# Patient Record
Sex: Male | Born: 1985 | Race: White | Hispanic: No | Marital: Single | Smoking: Never smoker
Health system: Southern US, Community
[De-identification: ages and names within clinical notes are randomized; demographics above are authoritative.]

## PROBLEM LIST (undated history)

## (undated) DIAGNOSIS — F79 Unspecified intellectual disabilities: Secondary | ICD-10-CM

## (undated) DIAGNOSIS — Z9281 Personal history of extracorporeal membrane oxygenation (ECMO): Secondary | ICD-10-CM

## (undated) DIAGNOSIS — F419 Anxiety disorder, unspecified: Secondary | ICD-10-CM

## (undated) DIAGNOSIS — I10 Essential (primary) hypertension: Secondary | ICD-10-CM

## (undated) DIAGNOSIS — G40909 Epilepsy, unspecified, not intractable, without status epilepticus: Secondary | ICD-10-CM

## (undated) HISTORY — DX: Epilepsy, unspecified, not intractable, without status epilepticus: G40.909

## (undated) HISTORY — DX: Anxiety disorder, unspecified: F41.9

## (undated) HISTORY — DX: Essential (primary) hypertension: I10

## (undated) HISTORY — DX: Personal history of extracorporeal membrane oxygenation (ECMO): Z92.81

## (undated) HISTORY — DX: Unspecified intellectual disabilities: F79

---
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
## 2009-11-03 ENCOUNTER — Emergency Department (HOSPITAL_COMMUNITY): Admission: EM | Admit: 2009-11-03 | Discharge: 2009-11-03 | Payer: Self-pay | Admitting: Family Medicine

## 2010-07-21 ENCOUNTER — Encounter: Admission: RE | Admit: 2010-07-21 | Discharge: 2010-08-04 | Payer: Self-pay | Source: Home / Self Care

## 2010-08-04 ENCOUNTER — Encounter
Admission: RE | Admit: 2010-08-04 | Discharge: 2010-09-02 | Payer: Self-pay | Source: Home / Self Care | Attending: Psychiatry | Admitting: Psychiatry

## 2012-10-26 ENCOUNTER — Ambulatory Visit (INDEPENDENT_AMBULATORY_CARE_PROVIDER_SITE_OTHER): Payer: Medicaid Other | Admitting: Neurology

## 2012-10-26 ENCOUNTER — Encounter: Payer: Self-pay | Admitting: Neurology

## 2012-10-26 VITALS — BP 130/80 | Resp 14 | Wt 189.0 lb

## 2012-10-26 DIAGNOSIS — Q079 Congenital malformation of nervous system, unspecified: Secondary | ICD-10-CM

## 2012-10-26 DIAGNOSIS — G40209 Localization-related (focal) (partial) symptomatic epilepsy and epileptic syndromes with complex partial seizures, not intractable, without status epilepticus: Secondary | ICD-10-CM

## 2012-10-26 NOTE — Progress Notes (Signed)
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  He attended special school for several years although there were problems with aggressive behavior. He is now cared for at home by his mother and his sister.  In 2010 or 2011, he was initiated on Clozaril, and gradually escalated up to a dosage of 100 mg per day.  This is greatly decreased the aggressive behavior, and he is more able to show his gentler more acceptable side.  He still has impulsivity and he has just recently started a trial of Concerta 36 mg to see if this will affect his behavior.  At some point, he did have an EEG and this was interpreted as showing epileptiform discharges.  This is per his mother, and his mother thinks that the EEG was done after the Clozaril was started.  For the past 2 years, he has had episodes of loss of tone where he will fall forward or backward or sideways. It has happened a few times a week. He falls back he typically ends up more or less sitting and he doesn't fall back in his head. He falls forward he tends to his knees in his hands.  He did not lose consciousness but he may turn pale in color or case the period when his vital signs are checked after these episodes they don't seem to be particularly out of range.  He has had some episodes of low white blood cell count while in the Clozaril.  He is now referred for evaluation of the falling attacks.  He is nonverbal so it is difficult to get comprehensive review of systems.  He generally does not complain of pain except when he falls on his hands her knees.  He sleeps very well and he has impulsive and aggressive behavior at times.  He has not cooperative with testing  her blood showing at all times. It was difficult to get his blood pressure today. Nothing further is revealed in the review of symptoms.  Past Medical History  Diagnosis Date  . Personal history of extracorporeal membrane oxygenation (ECMO)   . Mental retardation    scar from ecmo and possibly an occluded right internal carotid artery.  No current outpatient prescriptions on file prior to visit.   No current facility-administered medications on file prior to visit.   Review of patient's allergies indicates no known allergies.  History   Social History  . Marital Status: Single    Spouse Name: N/A    Number of Children: N/A  . Years of Education: N/A   Occupational History  . Not on file.   Social History Main Topics  . Smoking status: Never Smoker   . Smokeless tobacco: Never Used  . Alcohol Use: No  . Drug Use: No  . Sexually Active: Not on file   Other Topics Concern  . Not on file   Social History Narrative  . No narrative on file   No family history on file.   BP 130/80  Resp 14  Wt 189 lb (85.73 kg)   Alert And nonverbal  No carotid bruits detected.  Cranial nerve II through XII are grossly within normal  limits.  Motor strength is  Grossly 5 over 5 throughout all limbs.  No atrophy,    His movements are somewhat herky-jerky but no gross choreiform movements.  Tone may be slightly increased Reflexes are1- 2+ and symmetric in the upper and lower extremities Sensory exam is grossly intact. Coordination reveals a somewhat jerky movements in the limbs Gait and station reveals valgus deformity of the knees and poor tandem.   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 This has resulted in behavioral problems which were very difficult to control and had been benefited significantly with the addition of Clozaril 400 mg.  However, there is also report of epileptiform discharges from the  brain, which can be do to Clozaril.   The drop attacks he is having may well be cerebral in origin and there is the possibility that this could be do to an epileptiform issue.  So there is definitely some significant chance that the medication has resulted in him having these episodes, or least worsening them.  His mother feel that at this point the benefits outweigh the issues, although she is somewhat concerned about the potential for injury.  He has not had a head injury so far and he has not required a helmet.  Concerta can be promoting of epileptiform discharges as well and this medication should be used with caution.  Plan: 1. He'll finish out his Concerta trial for one month and is noticing a benefit I assume that this will be discontinued. 2. Is mother will discuss whether there are potential alternatives to Clozaril with his prescribing caregiver. 3.  He will return here in 6 weeks and if he is to continue to Clozaril and the spells have not resolved, we will consider adding another medication to see if it will slow down these attacks.  I explained to his mother that there is no guarantee that, but there is some possibility that another medication could be helpful in this regard.

## 2012-10-28 ENCOUNTER — Other Ambulatory Visit: Payer: Self-pay | Admitting: Neurology

## 2012-10-28 DIAGNOSIS — G40309 Generalized idiopathic epilepsy and epileptic syndromes, not intractable, without status epilepticus: Secondary | ICD-10-CM

## 2012-10-28 MED ORDER — DIVALPROEX SODIUM ER 500 MG PO TB24: 500.0000 mg | ORAL_TABLET | Freq: Two times a day (BID) | ORAL | Status: DC

## 2012-11-04 ENCOUNTER — Telehealth: Payer: Self-pay | Admitting: Neurology

## 2012-11-04 NOTE — Telephone Encounter (Signed)
Called and spoke with the patient's mom. She reports that Julian Gray is worse and she feels it is from the medication (Depakote) that Dr. Smiley Houseman prescribed at his last office visit. She also states that Concerta was started on 3/10 and stopped on 3/26. She doesn't feel like he is worse because of this. I let her know that Dr. Smiley Houseman was out of the office today but would be back on 04/07. I recommended a couple of things to her: I told her I would try to get in touch with Dr. Smiley Houseman and ask him to review this if she didn't want to wait until Monday; I suggested that she could call his PCP for his opinion; if his symptoms worsened, she should take him to the ED; and lastly, I recommended she go to once a day dosing until Monday. Julian Gray states she will decrease the Depakote to once a day and see if that helps.  **Dr. Smiley Houseman, please advise.

## 2012-11-04 NOTE — Telephone Encounter (Signed)
Lee left VM message: "He saw Dr. Smiley Houseman on March 26. He was prescribed Depakote and he has been taking it since the 27th. He is having more falls--falling forward, falling backwards, falling while sitting; lots of eye blinking; he is having trouble just getting through the day. Sits slumped in his chair; can barely use the utensils to eat his food. He is taking the Depakote 500 mg BID and we need to know what Dr. Smiley Houseman wants [place] to do. CB [id number].

## 2012-11-07 ENCOUNTER — Other Ambulatory Visit: Payer: Self-pay | Admitting: Neurology

## 2012-11-07 DIAGNOSIS — R569 Unspecified convulsions: Secondary | ICD-10-CM

## 2012-11-07 NOTE — Telephone Encounter (Signed)
Decrease depakote to once a day.  If she thinks he would be able to cooperate, then a follow up EEG test would give [place] useful information.  So if possible, we can order this as well.

## 2012-11-07 NOTE — Telephone Encounter (Signed)
Called and spoke with Julian Gray. Informed to not restart the Depakote at this time. EEG scheduled for Wednesday, April 16th at 11:00 am. Aware of the appointment and phone number given as well.

## 2012-11-07 NOTE — Telephone Encounter (Signed)
If he responded that poorly, then don't restart.  We can check the EEG first then see.

## 2012-11-07 NOTE — Telephone Encounter (Signed)
Called and spoke with Julian Gray, the patient's mom. She reports that she has stopped the Depakote altogether (last dose Friday am). He continues to fall and be very weak. Has fallen 3 times this morning. He was able to hold his spoon and feed himself this morning but needed help with his drinking cup. She feels like he will be able to have the EEG and is in favor of that. She wanted to know if Dr. Smiley Houseman wants her to restart the Depakote? I told her that I would check with him, schedule the EEG and call her with the appointment and Dr. Hyacinth Meeker recommendation. She is fine with this plan. **Dr. Smiley Houseman, restart the Depakote?

## 2012-11-16 ENCOUNTER — Ambulatory Visit (HOSPITAL_COMMUNITY)
Admission: RE | Admit: 2012-11-16 | Discharge: 2012-11-16 | Disposition: A | Discharge status: Home / Self Care | Payer: Medicaid Other | Source: Ambulatory Visit | Attending: Neurology | Admitting: Neurology

## 2012-11-16 DIAGNOSIS — R569 Unspecified convulsions: Secondary | ICD-10-CM | POA: Insufficient documentation

## 2012-11-16 DIAGNOSIS — F79 Unspecified intellectual disabilities: Secondary | ICD-10-CM | POA: Insufficient documentation

## 2012-11-16 DIAGNOSIS — G40209 Localization-related (focal) (partial) symptomatic epilepsy and epileptic syndromes with complex partial seizures, not intractable, without status epilepticus: Secondary | ICD-10-CM

## 2012-11-16 NOTE — Progress Notes (Signed)
EEG  Completed; results pending. 

## 2012-11-21 NOTE — Procedures (Signed)
Julian Gray is a 27 y.o. male patient. 1. Seizures    Past Medical History  Diagnosis Date  . Personal history of extracorporeal membrane oxygenation (ECMO)   . Mental retardation    There were no vitals taken for this visit.  Procedures  Routine EEG  Indication: Falling and twitching spells  This is a 17 channel EEG using standard 10-20 electrode placement  Findings.  The background cosists of 6-8 hz symmetric theta acivitiy.  There is prominenet superimposed 3 hz delta activity, maximal in the right fronatl region, and reflected to the left with smaller amplitude.  The amplitude is 150 to 200 microvolts.  The delta occurs in periods lasting 3 to 9 seconds and is present for aproximately 40% of the recording.  There are also sharp waves and spikes maximal at the F4 electrode sometimes seen during the delta activity, but no classic 3 hx spike and wave is seen.  Interpretation. 1.  Generalized lsowing with 6-8hz  background 2. Focal slowing with delta in the fronatal areas occuring frequently and maximal on the right 3.  Sharp waves and spikes maximal at F4 (right frontal) This would be consistent with the history of significant perinatal birth injury, pewrhaps affecting the right fronatl area the most.  The spike wave forms do suggest a significant possibility that a seizure focus is also present in the right frontal lobe.  Cayde Held  L. 11/21/2012

## 2012-12-07 ENCOUNTER — Encounter: Payer: Self-pay | Admitting: Neurology

## 2012-12-07 ENCOUNTER — Ambulatory Visit (INDEPENDENT_AMBULATORY_CARE_PROVIDER_SITE_OTHER): Payer: Medicaid Other | Admitting: Neurology

## 2012-12-07 VITALS — BP 124/78 | HR 80 | Wt 193.0 lb

## 2012-12-07 DIAGNOSIS — G40309 Generalized idiopathic epilepsy and epileptic syndromes, not intractable, without status epilepticus: Secondary | ICD-10-CM

## 2012-12-07 MED ORDER — TOPIRAMATE 25 MG PO TABS: 25.0000 mg | ORAL_TABLET | Freq: Two times a day (BID) | ORAL | Status: DC

## 2012-12-07 NOTE — Progress Notes (Signed)
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  [name] did not have a history of known epilepsy.  However, [name] had an EEG a couple of years ago which showed epileptiform activity.  [name] has been on Clozaril for 2 or 3 years which has really helped his aggressive behavior.  [name] started having falling attacks or drop attacks, which have increased to the point that is having at least one a day.  They occur when [name] sitting or standing. [name] will start to lose postural tone.  It almost looks like a salaam seizure where [name] will bend forward or will not as had 2 or 3 times, and then it is over.  [name] tends to know what is coming, so that when [name] falls [name] puts his hands out to break his fall.  Sometimes [name] falls backwards any [name] will get his butt and roll.    We had an EEG done any shows a slow background in the theta frequency range and [name] also has superimposed bruxism his adult activity higher on the right that lasts for several seconds per episode, and it comprises almost 40% of the record.  It is associated with some sharp waveforms in the right frontal area and sometimes there are spikes.  However is not classic 3 Hz spike and wave.  It is more of a paroxysmal delta activity with some associated sharp waveforms and spikes.  [name] was given Depakote 500 mg twice a day, but his attacks actually worsened significantly and [name] was falling without breaking his fall.  [name] was falling more like a tree.  [name] is also come out of a concert over my concern that the stimulant could exacerbate seizure problems.  We also discussed the Clozaril is associated with epileptiform changes on EEG.  However, the Clozaril has been such a light changing medication that they would really prefer to continue on it.  They are also looking for something additional to see if that will help his impulsivity.  Strattera has been mentioned as a possibility.  The patient is unable to  respond to her review of systems interview.  Past Medical History  Diagnosis Date  . Personal history of extracorporeal membrane oxygenation (ECMO)   . Mental retardation     Current Outpatient Prescriptions on File Prior to Visit  Medication Sig Dispense Refill  . clozapine (FAZACLO) 100 MG disintegrating tablet Take 100 mg by mouth at bedtime.      . Multiple Vitamin (MULTIVITAMIN) tablet Take 1 tablet by mouth daily.      . methylphenidate (CONCERTA) 36 MG CR tablet Take 36 mg by mouth every morning.       No current facility-administered medications on file prior to visit.   Review of patient's allergies indicates no known allergies.  History   Social History  . Marital Status: Single    Spouse Name: N/A    Number of Children: N/A  . Years of Education: N/A   Occupational History  . Not on file.   Social History Main Topics  . Smoking status: Never Smoker   . Smokeless tobacco: Never Used  . Alcohol Use: No  . Drug Use: No  . Sexually Active: Not on file   Other Topics Concern  . Not on file   Social History Narrative  . No narrative on file   No family history on file.   BP 124/78  Pulse 80  Wt 193 lb (87.544 kg)  Alert And nonverbal  No carotid bruits detected.  Cranial nerve II through XII are grossly within normal limits.  Motor strength is Grossly 5 over 5 throughout all limbs. No atrophy, His movements are somewhat herky-jerky but no gross choreiform movements. Tone may be slightly increased  Reflexes are1- 2+ and symmetric in the upper and lower extremities  Sensory exam is grossly intact.  Coordination reveals a somewhat jerky movements in the limbs  Gait and station reveals valgus deformity of the knees and poor tandem.   [name] does have an intact above his head nodding forward to 3 times quickly and then almost falling out of the chair, but [name] recovers in the whole episode only lasts a couple seconds.  Impression: 1. Drop attacks in the last couple  years of increasing frequency.  The occurrence of these did follow the institution of the Clozaril medication.  As mentioned above, Clozaril was such a help to his quality of life at the family does not wish to discontinue it.  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 However a brief trial of Depakote caused worsening of the problem.  Plan: 1. We will cautiously try Topamax at 25 mg a night and increase to 25 mg twice a day.  If [name] tolerates this we may increase further to 50 mg twice a day and see if there is any change in the episodes. 2. If she again worsens or does not improve at all, we will refer him to [facility] epilepsy department for consideration of ambulatory EEG and further evaluation 3. Return in 6 weeks

## 2013-01-18 ENCOUNTER — Ambulatory Visit (INDEPENDENT_AMBULATORY_CARE_PROVIDER_SITE_OTHER): Payer: Medicaid Other | Admitting: Neurology

## 2013-01-18 ENCOUNTER — Encounter: Payer: Self-pay | Admitting: Neurology

## 2013-01-18 VITALS — BP 124/70 | HR 74 | Wt 200.0 lb

## 2013-01-18 DIAGNOSIS — G40309 Generalized idiopathic epilepsy and epileptic syndromes, not intractable, without status epilepticus: Secondary | ICD-10-CM

## 2013-01-18 DIAGNOSIS — G40219 Localization-related (focal) (partial) symptomatic epilepsy and epileptic syndromes with complex partial seizures, intractable, without status epilepticus: Secondary | ICD-10-CM

## 2013-01-18 MED ORDER — TOPIRAMATE 25 MG PO TABS: ORAL_TABLET | ORAL | Status: DC

## 2013-01-18 NOTE — Progress Notes (Signed)
Julian Gray returns for followupperinatal brain injury due to ECMO.  Please see previous notes for details of prior EEG results.  He has been manifesting head dropping and forward bending and bowing behavior that can be repetitive and sometimes can lead to a fall.  EEG did show some vocal rhythmic slow activity with sharp waveforms and spike wave forms, although not classic spike and wave discharge.  He actually was worse on Depakote and would fall if less warning.  Now he is on Topamax 25 mg b.i.d. And the spells aren't be stable and maybe a little bit improved.  Definitely in the last 2 weeks they are less.  However, his Clozaril was decreased from 400 mg to 200 mg a night 2 weeks ago.  Apparently this was due to some laboratory abnormality, possibly a liver function test.  Concurrently, his family has noted a little bit of increased aggressiveness.  He is attending [employer] summer and that is doing reasonably well.  [name] is essentially unable to respond to a review of systems and therefore the history is obtained to his mother.  No other significant complaints noted in the review of systems.  Past Medical History  Diagnosis Date  . Personal history of extracorporeal membrane oxygenation (ECMO)   . Mental retardation     Current Outpatient Prescriptions on File Prior to Visit  Medication Sig Dispense Refill  . clozapine (FAZACLO) 100 MG disintegrating tablet Take 100 mg by mouth at bedtime.      . Multiple Vitamin (MULTIVITAMIN) tablet Take 1 tablet by mouth daily.      Marland Kitchen topiramate (TOPAMAX) 25 MG tablet Take 1 tablet (25 mg total) by mouth 2 (two) times daily.  60 tablet  11   No current facility-administered medications on file prior to visit.   Review of patient's allergies indicates no known allergies.  History   Social History  . Marital Status: Single    Spouse Name: N/A    Number of Children: N/A  . Years of Education: N/A   Occupational History  . Not on file.   Social History Main  Topics  . Smoking status: Never Smoker   . Smokeless tobacco: Never Used  . Alcohol Use: No  . Drug Use: No  . Sexually Active: Not on file   Other Topics Concern  . Not on file   Social History Narrative  . No narrative on file   No family history on file.  BP 124/70  Pulse 74  Wt 200 lb (90.719 kg)   Alert And nonverbal  No carotid bruits detected.  Cranial nerve II through XII are grossly within normal limits.  Motor strength is Grossly 5 over 5 throughout all limbs. No atrophy, His movements are somewhat herky-jerky but no gross choreiform movements. Tone may be slightly increased  Reflexes are1- 2+ and symmetric in the upper and lower extremities  Sensory exam is grossly intact.  Coordination reveals a somewhat jerky movements in the limbs  Gait and station reveals valgus deformity of the knees and poor tandem.   Impression: Peroneal brain injury with focal slowing and focal sharp wave and spike amount is on EEG in this patient who is exhibiting falls and head nodding and forward bending episodes with loss of postural tone.  At this time, the episodes are improved and most of this is not leading to falling as he can catch himself.  This may be due to the reduction in the Clozaril an order to the initiation of the  Topamax at low dose at this point.  He does not appear to be having side effects.  Plan: The Clozaril is increased again, they will observe what as to whether the episodes again worsen.  Then based on observations they can decide whether to continue the Clozaril or reduced the dosage and look for other means of managing his behavior issues. In the meantime, we will go ahead and increase the Topamax to 25 mg in the morning +50 mg at night.  We will reserve the right to potentially increase to 50 mg b.i.d. Depending how this is tolerated. Return here in 4 months

## 2013-01-18 NOTE — Patient Instructions (Addendum)
Follow up in 4 months with Dr. Everlena Cooper.

## 2013-04-26 ENCOUNTER — Telehealth: Payer: Self-pay | Admitting: Neurology

## 2013-04-28 NOTE — Telephone Encounter (Signed)
Left a message for the patient's mom to return my call. 

## 2013-05-01 NOTE — Telephone Encounter (Signed)
Never received a call back

## 2013-05-22 ENCOUNTER — Ambulatory Visit (INDEPENDENT_AMBULATORY_CARE_PROVIDER_SITE_OTHER): Payer: Medicaid Other | Admitting: Neurology

## 2013-05-22 ENCOUNTER — Encounter: Payer: Self-pay | Admitting: Neurology

## 2013-05-22 VITALS — BP 96/68 | HR 90 | Wt 210.0 lb

## 2013-05-22 DIAGNOSIS — G40309 Generalized idiopathic epilepsy and epileptic syndromes, not intractable, without status epilepticus: Secondary | ICD-10-CM

## 2013-05-22 MED ORDER — TOPIRAMATE 25 MG PO TABS: 50.0000 mg | ORAL_TABLET | Freq: Two times a day (BID) | ORAL | Status: DC

## 2013-05-22 NOTE — Progress Notes (Signed)
NEUROLOGY FOLLOW UP OFFICE NOTE  CYPHER PAULE [id number]  HISTORY OF PRESENT ILLNESS: Julian Gray is a 27 year old male with history of mental retardation, premature birth, perinatal sepsis, and plagiocephaly who presents for follow up regarding epilepsy.  He is accompanied by his mother.  He was previously followed by Dr. Murriel Hopper, who has since left  Neurology.  Records and images were personally reviewed where available.    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  He was subsequently given a lifesaving extracorporeal membrane oxygenation (ECMO) treatment.  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  In around 2009, he was started on clozapine and later Concerta for explosive behavior.  He continues to have impulsive behavior, however aggressive behavior has improved and he sleeps well.  In 2010, he began having drop attacks and staring spells. He has loss of tone with head bending and will fall down, either forward, backward or to the side.  It occurs several times a week.  When he falls backward, he usually will fall sitting and not hit his head.  If he falls forward, he will usually land on his knees and hand.  He does not lose consciousness but may turn pale.  Vital signs afterwards are reportedly normal. It is often preceded by jerking of his head or body.  He was initially seen at [facility].  He has had an EEG on 03/19/10 which revealed intermittent bifrontal 3 Hz generalized spike and wave discharges as well as intermittent head jerk movements associated with polyspike discharges.  He was admitted to the EMU at [facility] for monitoring.  At least one of his spells were captured, but it is unclear what was the impression.  It was determined that these spells were due to his underlying brain damage.  He began seeing Dr. Smiley Houseman this past year.  Dr. Smiley Houseman considered that the epileptiform discharges may be  side effects of Clozaril or Concerta.  Concerta was subsequently stopped.  However, since he has responded so well to Clozaril, his mother did not want to discontinue.  Depakote 500mg  BID was initiated by Dr. Smiley Houseman.  His mother felt his spells worsened, with more falls, including while sitting.  He was no longer able to break his falls.  Also he was exhibiting excessive eye blinking and increased lethargy.  Depakote was subsequently discontinued.      An EEG was performed on 11/16/12, revealing 6-8 Hz generalized slowing with superimposed focal delta slowing over the bilateral frontal regions, maximal of the right, with associated sharp waves and spike waves maximal at F4.  However, there were no classic 3Hz  spike and wave discharges.  He was then started on Topamax, first 25mg  qhs and then increased to 25mg  BID.  He is currently being tapered off of clozapine.  He was initially at 400mg  daily and currently is on 100mg  daily.  He will soon be off it completely.  Topamax was increased to 25mg  qAM and 50mg  qhs.  His mother has not noticed any difference.  PAST MEDICAL HISTORY: Past Medical History  Diagnosis Date  . Personal history of extracorporeal membrane oxygenation (ECMO)   . Mental retardation     MEDICATIONS: Current Outpatient Prescriptions on File Prior to Visit  Medication Sig Dispense Refill  . clozapine (FAZACLO) 100 MG disintegrating tablet Take 100 mg by mouth at bedtime.      . Multiple Vitamin (MULTIVITAMIN) tablet Take 1  tablet by mouth daily.       No current facility-administered medications on file prior to visit.    ALLERGIES: No Known Allergies  FAMILY HISTORY: No family history on file.  SOCIAL HISTORY: History   Social History  . Marital Status: Single    Spouse Name: N/A    Number of Children: N/A  . Years of Education: N/A   Occupational History  . Not on file.   Social History Main Topics  . Smoking status: Never Smoker   . Smokeless tobacco: Never  Used  . Alcohol Use: No  . Drug Use: No  . Sexual Activity: Not on file   Other Topics Concern  . Not on file   Social History Narrative  . No narrative on file    REVIEW OF SYSTEMS: Constitutional: No fevers, chills, or sweats, no generalized fatigue, change in appetite Eyes: No visual changes, double vision, eye pain Ear, nose and throat: No hearing loss, ear pain, nasal congestion, sore throat Cardiovascular: No chest pain, palpitations Respiratory:  No shortness of breath at rest or with exertion, wheezes GastrointestinaI: No nausea, vomiting, diarrhea, abdominal pain, fecal incontinence Genitourinary:  No dysuria, urinary retention or frequency Musculoskeletal:  No neck pain, back pain Integumentary: No rash, pruritus, skin lesions Neurological: as above Psychiatric: No depression, insomnia, anxiety Endocrine: No palpitations, fatigue, diaphoresis, mood swings, change in appetite, change in weight, increased thirst Hematologic/Lymphatic:  No anemia, purpura, petechiae. Allergic/Immunologic: no itchy/runny eyes, nasal congestion, recent allergic reactions, rashes  PHYSICAL EXAM: Filed Vitals:   05/22/13 1403  BP: 96/68  Pulse: 90  Unable to obtain other vitals due to poor cooperation. General: No acute distress Head:  Normocephalic/atraumatic Neck: Right post-surgical scar Heart:  Regular rate and rhythm Lungs:  Clear to auscultation bilaterally Back: No paraspinal tenderness Neurological Exam: alert but not oriented. Unable to speak.  CN II-XII intact. Unable to perform fundoscopic exam.Bulk and tone normal, muscle strength 5/5 throughout.  Unable to assess sensation.  Deep tendon reflexes 2+ throughout, toes downgoing.  Finger to nose intact.  Postural tremor in hands.  Gait with valgus deformity in knees.  IMPRESSION: Drop attacks/atonic seizures.  PLAN: 1.  Increase Topamax to 50mg  BID.  Side effects discussed. 2.  Will call pharmacy regarding half-life of  clozapine.  I don't want to increase topamax too much while just weaning off of clozapine so we know which is effective if symptoms improve.  Once we know when medication effects should resolve, and he does not have any improvement, then we will further titrate topamax. 3.  Obtain records from [place]. 4.  Follow up in 3 months.  Shon Millet, DO  CC:  Guerry Bruin, MD

## 2013-05-22 NOTE — Patient Instructions (Signed)
1.  You may increase the topamax to 50mg  twice daily.  We will start topiramate (Topamax) 50mg  tablets.  We will increase the dose as follows to goal of 100mg  twice daily.  Possible side effects include: impaired thinking, sedation, paresthesias (numbness and tingling) and weight loss.  It may cause dehydration and there is a small risk for kidney stones, so make sure to stay hydrated with water during the day.  There is also a very small risk for glaucoma, so if we decide to stay on this medication, Julian Gray should have yearly eye exams.  2.  Before going up any further on the topamax, I want to check with pharmacy regarding how long the clozapine would stay in his system.  Once we know when side effects of clozapine should be resolved, and falls have not improved, then we can start going up on the topamax.  3.  We will get records from [facility].  4.  Follow up in 3 months.  Call with questions and concerns.

## 2013-05-23 ENCOUNTER — Telehealth: Payer: Self-pay | Admitting: Neurology

## 2013-05-23 ENCOUNTER — Other Ambulatory Visit: Payer: Self-pay | Admitting: Neurology

## 2013-05-23 MED ORDER — TOPIRAMATE 25 MG PO TABS: ORAL_TABLET | ORAL | Status: DC

## 2013-05-23 NOTE — Telephone Encounter (Signed)
Spoke with Julian Gray's mom. She wanted to know if she could start the Topamax 50mg  BID while still taking the Clozapine. I told her it was ok. She did speak with the pharmacist who told her 100 mg a day of the Clozapine was not therapeutic anyway. She will discuss this medication with his psychiatrist when he has his appointment next week. **Dr. Everlena Cooper, just a FYI.Marland KitchenMarland Kitchen

## 2013-08-21 ENCOUNTER — Encounter: Payer: Self-pay | Admitting: Neurology

## 2013-08-21 ENCOUNTER — Ambulatory Visit (INDEPENDENT_AMBULATORY_CARE_PROVIDER_SITE_OTHER): Payer: Medicaid Other | Admitting: Neurology

## 2013-08-21 DIAGNOSIS — F39 Unspecified mood [affective] disorder: Secondary | ICD-10-CM

## 2013-08-21 NOTE — Progress Notes (Signed)
NEUROLOGY FOLLOW UP OFFICE NOTE  Julian FlakesKyle R Meyers [id number]  HISTORY OF PRESENT ILLNESS: Julian FootsKyle Gray is a 28 year old male with history of mental retardation, premature birth, perinatal sepsis, and plagiocephaly who presents for follow up regarding epilepsy.  He is accompanied by his mother and his sister.  Records and images were personally reviewed where available.    UPDATE: Since last visit, he was tapered off of clazapine and the drop attacks resolved.  Topamax was discontinued.  However, he had worsening mood problems and violent explosive outbursts.  He was tried on trazodone and Ativan, which were ineffective so then discontinued.  He was started on Zyprexa and Trileptal, and his behavior has improved.  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  He was subsequently given a lifesaving extracorporeal membrane oxygenation (ECMO) treatment.  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  In around 2009, he was started on clozapine and later Concerta for explosive behavior.  He continues to have impulsive behavior, however aggressive behavior has improved and he sleeps well.  In 2010, he began having drop attacks and staring spells. He has loss of tone with head bending and will fall down, either forward, backward or to the side.  It occurs several times a week.  When he falls backward, he usually will fall sitting and not hit his head.  If he falls forward, he will usually land on his knees and hand.  He does not lose consciousness but may turn pale.  Vital signs afterwards are reportedly normal. It is often preceded by jerking of his head or body.  He was [facility] seen at [facility].  He has had an EEG on 03/19/10 which revealed intermittent bifrontal 3 Hz generalized spike and wave discharges as well as intermittent head jerk movements associated with polyspike discharges.  He was admitted [facility] EMU at  [facility].  At least one of his spells were captured, which did not reveal any electrographic correlate.  It was determined that these spells were due to his underlying brain damage.  His previous neurologist,  Dr. Smiley HousemanSoo considered that the epileptiform discharges may be side effects of Clozaril or Concerta.  Concerta was subsequently stopped.  However, since he has responded so well to Clozaril, his mother did not want to discontinue.  Depakote 500mg  BID was initiated by Dr. Smiley HousemanSoo.  His mother felt his spells worsened, with more falls, including while sitting.  He was no longer able to break his falls.  Also he was exhibiting excessive eye blinking and increased lethargy.  Depakote was subsequently discontinued.    He was then started on Topamax.  It was decided to slowly taper him off of clozapine to see if the drop attacks resolve.  An EEG was performed on 11/16/12, revealing 6-8 Hz generalized slowing with superimposed focal delta slowing over the bilateral frontal regions, maximal of the right, with associated sharp waves and spike waves maximal at F4.  However, there were no classic 3Hz  spike and wave discharges.  PAST MEDICAL HISTORY: Past Medical History  Diagnosis Date  . Personal history of extracorporeal membrane oxygenation (ECMO)   . Mental retardation   . Epilepsy     MEDICATIONS: Current Outpatient Prescriptions on File Prior to Visit  Medication Sig Dispense Refill  . Multiple Vitamin (MULTIVITAMIN) tablet Take 1 tablet by mouth daily.       No current facility-administered medications  on file prior to visit.    ALLERGIES: No Known Allergies  FAMILY HISTORY: Family History  Problem Relation Age of Onset  . Ataxia Neg Hx   . Chorea Neg Hx   . Dementia Neg Hx   . Mental retardation Neg Hx   . Migraines Neg Hx   . Multiple sclerosis Neg Hx   . Neurofibromatosis Neg Hx   . Neuropathy Neg Hx   . Parkinsonism Neg Hx   . Seizures Neg Hx   . Stroke Neg Hx      SOCIAL HISTORY: History   Social History  . Marital Status: Single    Spouse Name: N/A    Number of Children: N/A  . Years of Education: N/A   Occupational History  . Not on file.   Social History Main Topics  . Smoking status: Never Smoker   . Smokeless tobacco: Never Used  . Alcohol Use: No  . Drug Use: No  . Sexual Activity: Not on file   Other Topics Concern  . Not on file   Social History Narrative  . No narrative on file    REVIEW OF SYSTEMS: Constitutional: No fevers, chills, or sweats, no generalized fatigue, change in appetite Eyes: No visual changes, double vision, eye pain Ear, nose and throat: No hearing loss, ear pain, nasal congestion, sore throat Cardiovascular: No chest pain, palpitations Respiratory:  No shortness of breath at rest or with exertion, wheezes GastrointestinaI: No nausea, vomiting, diarrhea, abdominal pain, fecal incontinence Genitourinary:  No dysuria, urinary retention or frequency Musculoskeletal:  No neck pain, back pain Integumentary: No rash, pruritus, skin lesions Neurological: as above Psychiatric: No depression, insomnia, anxiety Endocrine: No palpitations, fatigue, diaphoresis, mood swings, change in appetite, change in weight, increased thirst Hematologic/Lymphatic:  No anemia, purpura, petechiae. Allergic/Immunologic: no itchy/runny eyes, nasal congestion, recent allergic reactions, rashes  PHYSICAL EXAM: Filed Vitals:   08/21/13 1438  BP: 138/86  Pulse: 88  Resp: 16   General: No acute distress Head:  Normocephalic/atraumatic  IMPRESSION: Non-epileptic spells, secondary to clozapine Mood disorder History of anoxic brain injury  PLAN: 1.  Further management as per psychiatry.  Since I am not actively treating a neurologic condition, no further follow-up warranted but I would be happy to see him either on an annual basis or as needed.  30 minutes spent with patient and mother, 100% spent counseling and  coordinating care.  Shon Millet, DO  CC:  Guerry Bruin, MD

## 2013-08-21 NOTE — Patient Instructions (Signed)
So it doesn't seem like that Julian Gray is having any seizures.  At this point, I would continue working with the psychiatrist to best treat his mood and behavior.  Since there is nothing neurologic that I am actively treating, no frequent follow-up is needed.  However, he can always follow up on an annual basis or as needed if you would like.  Call with questions or concerns.

## 2015-02-27 DIAGNOSIS — F840 Autistic disorder: Secondary | ICD-10-CM | POA: Insufficient documentation

## 2015-02-27 DIAGNOSIS — F79 Unspecified intellectual disabilities: Secondary | ICD-10-CM | POA: Insufficient documentation

## 2019-08-24 ENCOUNTER — Inpatient Hospital Stay (HOSPITAL_COMMUNITY)
Admission: EM | Admit: 2019-08-24 | Discharge: 2019-09-04 | DRG: 368 | Disposition: A | Discharge status: Home Health | Payer: Medicaid Other | Attending: Thoracic Surgery (Cardiothoracic Vascular Surgery) | Admitting: Thoracic Surgery (Cardiothoracic Vascular Surgery)

## 2019-08-24 ENCOUNTER — Inpatient Hospital Stay (HOSPITAL_COMMUNITY): Payer: Medicaid Other

## 2019-08-24 ENCOUNTER — Emergency Department (HOSPITAL_COMMUNITY): Payer: Medicaid Other

## 2019-08-24 ENCOUNTER — Encounter (HOSPITAL_COMMUNITY)
Admission: EM | Disposition: A | Discharge status: Home Health | Payer: Self-pay | Source: Home / Self Care | Attending: Thoracic Surgery (Cardiothoracic Vascular Surgery)

## 2019-08-24 ENCOUNTER — Inpatient Hospital Stay (HOSPITAL_COMMUNITY): Payer: Medicaid Other | Admitting: Anesthesiology

## 2019-08-24 ENCOUNTER — Encounter (HOSPITAL_COMMUNITY): Payer: Self-pay

## 2019-08-24 ENCOUNTER — Other Ambulatory Visit: Payer: Self-pay

## 2019-08-24 DIAGNOSIS — Z4682 Encounter for fitting and adjustment of non-vascular catheter: Secondary | ICD-10-CM

## 2019-08-24 DIAGNOSIS — J982 Interstitial emphysema: Secondary | ICD-10-CM | POA: Diagnosis present

## 2019-08-24 DIAGNOSIS — F840 Autistic disorder: Secondary | ICD-10-CM | POA: Diagnosis present

## 2019-08-24 DIAGNOSIS — M7989 Other specified soft tissue disorders: Secondary | ICD-10-CM | POA: Diagnosis not present

## 2019-08-24 DIAGNOSIS — Z9281 Personal history of extracorporeal membrane oxygenation (ECMO): Secondary | ICD-10-CM

## 2019-08-24 DIAGNOSIS — F79 Unspecified intellectual disabilities: Secondary | ICD-10-CM | POA: Diagnosis present

## 2019-08-24 DIAGNOSIS — Y848 Other medical procedures as the cause of abnormal reaction of the patient, or of later complication, without mention of misadventure at the time of the procedure: Secondary | ICD-10-CM | POA: Diagnosis not present

## 2019-08-24 DIAGNOSIS — G931 Anoxic brain damage, not elsewhere classified: Secondary | ICD-10-CM | POA: Diagnosis present

## 2019-08-24 DIAGNOSIS — Z09 Encounter for follow-up examination after completed treatment for conditions other than malignant neoplasm: Secondary | ICD-10-CM

## 2019-08-24 DIAGNOSIS — J690 Pneumonitis due to inhalation of food and vomit: Secondary | ICD-10-CM | POA: Diagnosis present

## 2019-08-24 DIAGNOSIS — Z20822 Contact with and (suspected) exposure to covid-19: Secondary | ICD-10-CM | POA: Diagnosis present

## 2019-08-24 DIAGNOSIS — I82611 Acute embolism and thrombosis of superficial veins of right upper extremity: Secondary | ICD-10-CM | POA: Diagnosis not present

## 2019-08-24 DIAGNOSIS — R509 Fever, unspecified: Secondary | ICD-10-CM

## 2019-08-24 DIAGNOSIS — K223 Perforation of esophagus: Principal | ICD-10-CM

## 2019-08-24 DIAGNOSIS — G40909 Epilepsy, unspecified, not intractable, without status epilepticus: Secondary | ICD-10-CM | POA: Diagnosis present

## 2019-08-24 DIAGNOSIS — J189 Pneumonia, unspecified organism: Secondary | ICD-10-CM

## 2019-08-24 DIAGNOSIS — Z79899 Other long term (current) drug therapy: Secondary | ICD-10-CM | POA: Diagnosis not present

## 2019-08-24 DIAGNOSIS — R0902 Hypoxemia: Secondary | ICD-10-CM | POA: Diagnosis not present

## 2019-08-24 DIAGNOSIS — Z419 Encounter for procedure for purposes other than remedying health state, unspecified: Secondary | ICD-10-CM

## 2019-08-24 DIAGNOSIS — J90 Pleural effusion, not elsewhere classified: Secondary | ICD-10-CM

## 2019-08-24 HISTORY — PX: REMOVAL OF GASTROSTOMY TUBE: SHX6058

## 2019-08-24 HISTORY — PX: ESOPHAGOGASTRODUODENOSCOPY: SHX5428

## 2019-08-24 HISTORY — PX: CHEST TUBE INSERTION: SHX231

## 2019-08-24 LAB — POC SARS CORONAVIRUS 2 AG -  ED: SARS Coronavirus 2 Ag: NEGATIVE

## 2019-08-24 LAB — CBC WITH DIFFERENTIAL/PLATELET
Abs Immature Granulocytes: 0.05 10*3/uL (ref 0.00–0.07)
Basophils Absolute: 0 10*3/uL (ref 0.0–0.1)
Basophils Relative: 0 %
Eosinophils Absolute: 0.1 10*3/uL (ref 0.0–0.5)
Eosinophils Relative: 1 %
HCT: 55.5 % — ABNORMAL HIGH (ref 39.0–52.0)
Hemoglobin: 18.7 g/dL — ABNORMAL HIGH (ref 13.0–17.0)
Immature Granulocytes: 0 %
Lymphocytes Relative: 7 %
Lymphs Abs: 0.8 10*3/uL (ref 0.7–4.0)
MCH: 30.2 pg (ref 26.0–34.0)
MCHC: 33.7 g/dL (ref 30.0–36.0)
MCV: 89.5 fL (ref 80.0–100.0)
Monocytes Absolute: 1.2 10*3/uL — ABNORMAL HIGH (ref 0.1–1.0)
Monocytes Relative: 11 %
Neutro Abs: 9.5 10*3/uL — ABNORMAL HIGH (ref 1.7–7.7)
Neutrophils Relative %: 81 %
Platelets: 237 10*3/uL (ref 150–400)
RBC: 6.2 MIL/uL — ABNORMAL HIGH (ref 4.22–5.81)
RDW: 12.3 % (ref 11.5–15.5)
WBC: 11.7 10*3/uL — ABNORMAL HIGH (ref 4.0–10.5)
nRBC: 0 % (ref 0.0–0.2)

## 2019-08-24 LAB — I-STAT CHEM 8, ED
BUN: 25 mg/dL — ABNORMAL HIGH (ref 6–20)
Calcium, Ion: 1.17 mmol/L (ref 1.15–1.40)
Chloride: 106 mmol/L (ref 98–111)
Creatinine, Ser: 0.8 mg/dL (ref 0.61–1.24)
Glucose, Bld: 130 mg/dL — ABNORMAL HIGH (ref 70–99)
HCT: 56 % — ABNORMAL HIGH (ref 39.0–52.0)
Hemoglobin: 19 g/dL — ABNORMAL HIGH (ref 13.0–17.0)
Potassium: 4.3 mmol/L (ref 3.5–5.1)
Sodium: 141 mmol/L (ref 135–145)
TCO2: 23 mmol/L (ref 22–32)

## 2019-08-24 LAB — HEPATIC FUNCTION PANEL
ALT: 26 U/L (ref 0–44)
AST: 20 U/L (ref 15–41)
Albumin: 5.1 g/dL — ABNORMAL HIGH (ref 3.5–5.0)
Alkaline Phosphatase: 104 U/L (ref 38–126)
Bilirubin, Direct: 0.1 mg/dL (ref 0.0–0.2)
Indirect Bilirubin: 0.8 mg/dL (ref 0.3–0.9)
Total Bilirubin: 0.9 mg/dL (ref 0.3–1.2)
Total Protein: 8.5 g/dL — ABNORMAL HIGH (ref 6.5–8.1)

## 2019-08-24 LAB — BASIC METABOLIC PANEL
Anion gap: 13 (ref 5–15)
BUN: 25 mg/dL — ABNORMAL HIGH (ref 6–20)
CO2: 24 mmol/L (ref 22–32)
Calcium: 10 mg/dL (ref 8.9–10.3)
Chloride: 104 mmol/L (ref 98–111)
Creatinine, Ser: 0.85 mg/dL (ref 0.61–1.24)
GFR calc Af Amer: >60 mL/min (ref >60)
GFR calc non Af Amer: >60 mL/min (ref >60)
Glucose, Bld: 120 mg/dL — ABNORMAL HIGH (ref 70–99)
Potassium: 4.3 mmol/L (ref 3.5–5.1)
Sodium: 141 mmol/L (ref 135–145)

## 2019-08-24 LAB — RESPIRATORY PANEL BY RT PCR (FLU A&B, COVID)
Influenza A by PCR: NEGATIVE
Influenza B by PCR: NEGATIVE
SARS Coronavirus 2 by RT PCR: NEGATIVE

## 2019-08-24 LAB — BLOOD GAS, ARTERIAL
Acid-base deficit: 0.2 mmol/L (ref 0.0–2.0)
Bicarbonate: 23.6 mmol/L (ref 20.0–28.0)
FIO2: 100
O2 Saturation: 99.6 %
Patient temperature: 98.6
pCO2 arterial: 38.1 mmHg (ref 32.0–48.0)
pH, Arterial: 7.409 (ref 7.350–7.450)
pO2, Arterial: 385 mmHg — ABNORMAL HIGH (ref 83.0–108.0)

## 2019-08-24 LAB — TYPE AND SCREEN
ABO/RH(D): A POS
Antibody Screen: NEGATIVE

## 2019-08-24 LAB — LIPASE, BLOOD: Lipase: 21 U/L (ref 11–51)

## 2019-08-24 SURGERY — EGD (ESOPHAGOGASTRODUODENOSCOPY)
Anesthesia: General | Site: Chest | Laterality: Right

## 2019-08-24 MED ORDER — FENTANYL CITRATE (PF) 250 MCG/5ML IJ SOLN
INTRAMUSCULAR | Status: AC
  Filled 2019-08-24: qty 5

## 2019-08-24 MED ORDER — ETOMIDATE 2 MG/ML IV SOLN
INTRAVENOUS | Status: AC | PRN
  Administered 2019-08-24: 20 mg via INTRAVENOUS

## 2019-08-24 MED ORDER — SODIUM CHLORIDE 0.9 % IV SOLN: INTRAVENOUS | Status: DC | PRN

## 2019-08-24 MED ORDER — SODIUM CHLORIDE 0.9 % IV SOLN
INTRAVENOUS | Status: AC
  Filled 2019-08-24: qty 1.5

## 2019-08-24 MED ORDER — PROPOFOL 1000 MG/100ML IV EMUL
5.0000 ug/kg/min | INTRAVENOUS | Status: DC
  Administered 2019-08-24: 19:00:00 10 ug/kg/min via INTRAVENOUS
  Filled 2019-08-24 (×3): qty 100

## 2019-08-24 MED ORDER — SODIUM CHLORIDE (PF) 0.9 % IJ SOLN
INTRAMUSCULAR | Status: AC
  Filled 2019-08-24: qty 50

## 2019-08-24 MED ORDER — FENTANYL CITRATE (PF) 250 MCG/5ML IJ SOLN
INTRAMUSCULAR | Status: DC | PRN
  Administered 2019-08-24: 50 ug via INTRAVENOUS
  Administered 2019-08-24: 100 ug via INTRAVENOUS
  Administered 2019-08-24 (×2): 50 ug via INTRAVENOUS

## 2019-08-24 MED ORDER — LACTATED RINGERS IV SOLN: INTRAVENOUS | Status: DC | PRN

## 2019-08-24 MED ORDER — FENTANYL CITRATE (PF) 100 MCG/2ML IJ SOLN
50.0000 ug | Freq: Once | INTRAMUSCULAR | Status: AC
  Administered 2019-08-24: 21:00:00 50 ug via INTRAVENOUS
  Filled 2019-08-24: qty 2

## 2019-08-24 MED ORDER — SODIUM CHLORIDE 0.9 % IV SOLN
INTRAVENOUS | Status: DC | PRN
  Administered 2019-08-24: 1.5 g via INTRAVENOUS

## 2019-08-24 MED ORDER — PROPOFOL 500 MG/50ML IV EMUL
INTRAVENOUS | Status: DC | PRN
  Administered 2019-08-24: 25 ug/kg/min via INTRAVENOUS

## 2019-08-24 MED ORDER — SODIUM CHLORIDE 0.9 % IV BOLUS
500.0000 mL | Freq: Once | INTRAVENOUS | Status: AC
  Administered 2019-08-24: 19:00:00 500 mL via INTRAVENOUS

## 2019-08-24 MED ORDER — ONDANSETRON HCL 4 MG/2ML IJ SOLN: 4.0000 mg | Freq: Four times a day (QID) | INTRAMUSCULAR | Status: DC | PRN

## 2019-08-24 MED ORDER — SENNOSIDES-DOCUSATE SODIUM 8.6-50 MG PO TABS
1.0000 | ORAL_TABLET | Freq: Every day | ORAL | Status: DC
  Administered 2019-08-25 – 2019-09-03 (×10): 1 via ORAL
  Filled 2019-08-24 (×10): qty 1

## 2019-08-24 MED ORDER — INSULIN ASPART 100 UNIT/ML ~~LOC~~ SOLN
0.0000 [IU] | SUBCUTANEOUS | Status: DC
  Administered 2019-08-25: 16:00:00 2 [IU] via SUBCUTANEOUS
  Administered 2019-08-25 – 2019-08-26 (×2): 4 [IU] via SUBCUTANEOUS
  Administered 2019-08-26 – 2019-09-04 (×23): 2 [IU] via SUBCUTANEOUS

## 2019-08-24 MED ORDER — SODIUM CHLORIDE 0.9 % IV SOLN: INTRAVENOUS | Status: DC

## 2019-08-24 MED ORDER — FLUCONAZOLE IN SODIUM CHLORIDE 200-0.9 MG/100ML-% IV SOLN
200.0000 mg | INTRAVENOUS | Status: DC
  Administered 2019-08-25 – 2019-08-30 (×7): 200 mg via INTRAVENOUS
  Filled 2019-08-24 (×8): qty 100

## 2019-08-24 MED ORDER — HYDROMORPHONE HCL 1 MG/ML IJ SOLN: 1.0000 mg | INTRAMUSCULAR | Status: DC | PRN

## 2019-08-24 MED ORDER — HYDROMORPHONE HCL 1 MG/ML IJ SOLN
1.0000 mg | INTRAMUSCULAR | Status: DC | PRN
  Administered 2019-08-26 – 2019-08-27 (×2): 1 mg via INTRAVENOUS
  Filled 2019-08-24 (×2): qty 1

## 2019-08-24 MED ORDER — SUCCINYLCHOLINE CHLORIDE 20 MG/ML IJ SOLN
INTRAMUSCULAR | Status: AC | PRN
  Administered 2019-08-24: 100 mg via INTRAVENOUS

## 2019-08-24 MED ORDER — 0.9 % SODIUM CHLORIDE (POUR BTL) OPTIME
TOPICAL | Status: DC | PRN
  Administered 2019-08-24: 2000 mL

## 2019-08-24 MED ORDER — PROPOFOL 1000 MG/100ML IV EMUL
INTRAVENOUS | Status: AC
  Filled 2019-08-24: qty 100

## 2019-08-24 MED ORDER — DEXMEDETOMIDINE HCL IN NACL 400 MCG/100ML IV SOLN
0.4000 ug/kg/h | INTRAVENOUS | Status: DC
  Administered 2019-08-25: 0.8 ug/kg/h via INTRAVENOUS
  Filled 2019-08-24: qty 100

## 2019-08-24 MED ORDER — IOHEXOL 300 MG/ML  SOLN
75.0000 mL | Freq: Once | INTRAMUSCULAR | Status: AC | PRN
  Administered 2019-08-24: 19:00:00 75 mL via INTRAVENOUS

## 2019-08-24 MED ORDER — FENTANYL CITRATE (PF) 100 MCG/2ML IJ SOLN
50.0000 ug | Freq: Once | INTRAMUSCULAR | Status: AC
  Administered 2019-08-24: 18:00:00 50 ug via INTRAVENOUS
  Filled 2019-08-24: qty 2

## 2019-08-24 MED ORDER — BISACODYL 5 MG PO TBEC
10.0000 mg | DELAYED_RELEASE_TABLET | Freq: Every day | ORAL | Status: DC
  Filled 2019-08-24: qty 2

## 2019-08-24 MED ORDER — SODIUM CHLORIDE 0.9 % IV SOLN
3.0000 g | Freq: Once | INTRAVENOUS | Status: AC
  Administered 2019-08-24: 20:00:00 3 g via INTRAVENOUS
  Filled 2019-08-24: qty 8

## 2019-08-24 MED ORDER — CEFUROXIME SODIUM 1.5 G IV SOLR: 1.5000 g | Freq: Three times a day (TID) | INTRAVENOUS | Status: DC

## 2019-08-24 MED ORDER — PHENYLEPHRINE HCL-NACL 10-0.9 MG/250ML-% IV SOLN
INTRAVENOUS | Status: DC | PRN
  Administered 2019-08-24: 15 ug/min via INTRAVENOUS

## 2019-08-24 MED ORDER — ONDANSETRON HCL 4 MG/2ML IJ SOLN
4.0000 mg | Freq: Once | INTRAMUSCULAR | Status: AC
  Administered 2019-08-24: 18:00:00 4 mg via INTRAVENOUS
  Filled 2019-08-24: qty 2

## 2019-08-24 MED ORDER — ROCURONIUM BROMIDE 10 MG/ML (PF) SYRINGE
PREFILLED_SYRINGE | INTRAVENOUS | Status: DC | PRN
  Administered 2019-08-24: 20 mg via INTRAVENOUS
  Administered 2019-08-24: 50 mg via INTRAVENOUS
  Administered 2019-08-24: 30 mg via INTRAVENOUS
  Administered 2019-08-24: 50 mg via INTRAVENOUS

## 2019-08-24 MED ORDER — ETOMIDATE 2 MG/ML IV SOLN: 20.0000 mg | Freq: Once | INTRAVENOUS | Status: DC

## 2019-08-24 SURGICAL SUPPLY — 116 items
ADAPTER VALVE BIOPSY EBUS (MISCELLANEOUS) IMPLANT
ADPTR VALVE BIOPSY EBUS (MISCELLANEOUS)
APPLIER CLIP ROT 10 11.4 M/L (STAPLE)
BLADE CLIPPER SURG (BLADE) ×5 IMPLANT
BRUSH CYTOL CELLEBRITY 1.5X140 (MISCELLANEOUS) IMPLANT
CANISTER SUCT 3000ML PPV (MISCELLANEOUS) ×5 IMPLANT
CATH ROBINSON RED A/P 14FR (CATHETERS) IMPLANT
CATH THORACIC 28FR (CATHETERS) IMPLANT
CATH THORACIC 28FR RT ANG (CATHETERS) IMPLANT
CATH THORACIC 36FR (CATHETERS) IMPLANT
CATH THORACIC 36FR RT ANG (CATHETERS) IMPLANT
CATH TROCAR 20FR (CATHETERS) IMPLANT
CHLORAPREP W/TINT 26 (MISCELLANEOUS) ×5 IMPLANT
CLIP APPLIE ROT 10 11.4 M/L (STAPLE) IMPLANT
CLIP VESOCCLUDE MED 6/CT (CLIP) IMPLANT
CONN ST 1/4X3/8  BEN (MISCELLANEOUS)
CONN ST 1/4X3/8 BEN (MISCELLANEOUS) IMPLANT
CONN Y 3/8X3/8X3/8  BEN (MISCELLANEOUS)
CONN Y 3/8X3/8X3/8 BEN (MISCELLANEOUS) IMPLANT
CONT SPEC 4OZ CLIKSEAL STRL BL (MISCELLANEOUS) IMPLANT
COVER BACK TABLE 60X90IN (DRAPES) IMPLANT
COVER SURGICAL LIGHT HANDLE (MISCELLANEOUS) IMPLANT
DEFOGGER SCOPE WARMER CLEARIFY (MISCELLANEOUS) IMPLANT
DISSECTOR BLUNT TIP ENDO 5MM (MISCELLANEOUS) IMPLANT
DRAIN CHANNEL 28F RND 3/8 FF (WOUND CARE) IMPLANT
DRAIN CHANNEL 32F RND 10.7 FF (WOUND CARE) IMPLANT
DRAPE C-ARM 42X72 X-RAY (DRAPES) ×5 IMPLANT
DRAPE WARM FLUID 44X44 (DRAPES) IMPLANT
ELECT BLADE 6.5 EXT (BLADE) IMPLANT
ELECT REM PT RETURN 9FT ADLT (ELECTROSURGICAL) ×5
ELECTRODE REM PT RTRN 9FT ADLT (ELECTROSURGICAL) ×3 IMPLANT
FILTER STRAW FLUID ASPIR (MISCELLANEOUS) IMPLANT
FORCEPS BIOP RJ4 1.8 (CUTTING FORCEPS) IMPLANT
FORCEPS RADIAL JAW LRG 4 PULM (INSTRUMENTS) IMPLANT
GAUZE SPONGE 4X4 12PLY STRL (GAUZE/BANDAGES/DRESSINGS) ×5 IMPLANT
GLOVE BIO SURGEON STRL SZ7 (GLOVE) ×10 IMPLANT
GLOVE BIOGEL M 7.0 STRL (GLOVE) ×5 IMPLANT
GLOVE BIOGEL M STRL SZ7.5 (GLOVE) ×5 IMPLANT
GOWN STRL REUS W/ TWL LRG LVL3 (GOWN DISPOSABLE) ×6 IMPLANT
GOWN STRL REUS W/ TWL XL LVL3 (GOWN DISPOSABLE) ×3 IMPLANT
GOWN STRL REUS W/TWL LRG LVL3 (GOWN DISPOSABLE) ×4
GOWN STRL REUS W/TWL XL LVL3 (GOWN DISPOSABLE) ×2
HANDLE STAPLE ENDO GIA SHORT (STAPLE)
HEMOSTAT SURGICEL 2X14 (HEMOSTASIS) IMPLANT
KIT BASIN OR (CUSTOM PROCEDURE TRAY) ×5 IMPLANT
KIT CLEAN ENDO COMPLIANCE (KITS) IMPLANT
KIT SUCTION CATH 14FR (SUCTIONS) IMPLANT
KIT TURNOVER KIT B (KITS) ×5 IMPLANT
MARKER SKIN DUAL TIP RULER LAB (MISCELLANEOUS) ×5 IMPLANT
NEEDLE 22X1 1/2 (OR ONLY) (NEEDLE) IMPLANT
NEEDLE ASPIRATION VIZISHOT 19G (NEEDLE) IMPLANT
NEEDLE ASPIRATION VIZISHOT 21G (NEEDLE) IMPLANT
NEEDLE BLUNT 18X1 FOR OR ONLY (NEEDLE) IMPLANT
NEEDLE SPNL 18GX3.5 QUINCKE PK (NEEDLE) IMPLANT
NS IRRIG 1000ML POUR BTL (IV SOLUTION) ×10 IMPLANT
OIL SILICONE PENTAX (PARTS (SERVICE/REPAIRS)) IMPLANT
PACK CHEST (CUSTOM PROCEDURE TRAY) ×5 IMPLANT
PACK UNIVERSAL I (CUSTOM PROCEDURE TRAY) ×5 IMPLANT
PAD ARMBOARD 7.5X6 YLW CONV (MISCELLANEOUS) ×10 IMPLANT
POUCH ENDO CATCH II 15MM (MISCELLANEOUS) IMPLANT
POUCH SPECIMEN RETRIEVAL 10MM (ENDOMECHANICALS) IMPLANT
RADIAL JAW LRG 4 PULMONARY (INSTRUMENTS)
SCISSORS LAP 5X35 DISP (ENDOMECHANICALS) IMPLANT
SEALANT PROGEL (MISCELLANEOUS) IMPLANT
SEALANT SURG COSEAL 4ML (VASCULAR PRODUCTS) IMPLANT
SEALANT SURG COSEAL 8ML (VASCULAR PRODUCTS) IMPLANT
SEALER LIGASURE MARYLAND 30 (ELECTROSURGICAL) IMPLANT
SET IRRIG TUBING LAPAROSCOPIC (IRRIGATION / IRRIGATOR) IMPLANT
SPECIMEN JAR MEDIUM (MISCELLANEOUS) IMPLANT
SPONGE INTESTINAL PEANUT (DISPOSABLE) IMPLANT
SPONGE TONSIL TAPE 1 RFD (DISPOSABLE) IMPLANT
STAPLER ENDO GIA 12MM SHORT (STAPLE) IMPLANT
STOPCOCK 4 WAY LG BORE MALE ST (IV SETS) IMPLANT
SUT MNCRL AB 3-0 PS2 18 (SUTURE) IMPLANT
SUT MON AB 2-0 CT1 36 (SUTURE) IMPLANT
SUT PDS AB 1 CTX 36 (SUTURE) IMPLANT
SUT PROLENE 4 0 RB 1 (SUTURE)
SUT PROLENE 4-0 RB1 .5 CRCL 36 (SUTURE) IMPLANT
SUT SILK  1 MH (SUTURE) ×2
SUT SILK 1 MH (SUTURE) ×3 IMPLANT
SUT SILK 1 TIES 10X30 (SUTURE) IMPLANT
SUT SILK 2 0 SH (SUTURE) IMPLANT
SUT SILK 2 0SH CR/8 30 (SUTURE) IMPLANT
SUT VIC AB 1 CTX 36 (SUTURE)
SUT VIC AB 1 CTX36XBRD ANBCTR (SUTURE) IMPLANT
SUT VIC AB 2-0 CT1 27 (SUTURE)
SUT VIC AB 2-0 CT1 TAPERPNT 27 (SUTURE) IMPLANT
SUT VIC AB 3-0 SH 27 (SUTURE) ×2
SUT VIC AB 3-0 SH 27X BRD (SUTURE) ×3 IMPLANT
SUT VICRYL 0 UR6 27IN ABS (SUTURE) IMPLANT
SUT VICRYL 2 TP 1 (SUTURE) IMPLANT
SYR 10ML LL (SYRINGE) IMPLANT
SYR 20ML ECCENTRIC (SYRINGE) IMPLANT
SYR 20ML LL LF (SYRINGE) IMPLANT
SYR 30ML LL (SYRINGE) IMPLANT
SYR 3ML LL SCALE MARK (SYRINGE) IMPLANT
SYR 50ML LL SCALE MARK (SYRINGE) IMPLANT
SYR 5ML LL (SYRINGE) IMPLANT
SYR 5ML LUER SLIP (SYRINGE) IMPLANT
SYR BULB IRRIGATION 50ML (SYRINGE) ×5 IMPLANT
SYSTEM SAHARA CHEST DRAIN ATS (WOUND CARE) ×5 IMPLANT
TAPE CLOTH 4X10 WHT NS (GAUZE/BANDAGES/DRESSINGS) ×5 IMPLANT
TIP APPLICATOR SPRAY EXTEND 16 (VASCULAR PRODUCTS) IMPLANT
TOWEL GREEN STERILE (TOWEL DISPOSABLE) ×5 IMPLANT
TOWEL GREEN STERILE FF (TOWEL DISPOSABLE) IMPLANT
TRAP SPECIMEN MUCOUS 40CC (MISCELLANEOUS) IMPLANT
TRAY FOLEY MTR SLVR 16FR STAT (SET/KITS/TRAYS/PACK) IMPLANT
TROCAR BLADELESS 5M (ENDOMECHANICALS) ×5 IMPLANT
TROCAR XCEL BLADELESS 5X75MML (TROCAR) ×5 IMPLANT
TUBE CONNECTING 20'X1/4 (TUBING) ×1
TUBE CONNECTING 20X1/4 (TUBING) ×4 IMPLANT
TUBING EXTENTION W/L.L. (IV SETS) IMPLANT
VALVE BIOPSY  SINGLE USE (MISCELLANEOUS)
VALVE BIOPSY SINGLE USE (MISCELLANEOUS) IMPLANT
VALVE SUCTION BRONCHIO DISP (MISCELLANEOUS) IMPLANT
WATER STERILE IRR 1000ML POUR (IV SOLUTION) ×5 IMPLANT

## 2019-08-24 NOTE — ED Triage Notes (Signed)
Pt arrived into ED in wheelchair from home CC Chocking on food (apple and chips) and throwing up blood risk of aspiration. Pt is non verbal and father is at bedside.  91% room air VSS on 2L [place] 94%

## 2019-08-24 NOTE — Sedation Documentation (Signed)
Tube in

## 2019-08-24 NOTE — Progress Notes (Signed)
ABG obtained on PRVC 620, 16, peep 5 & 100%  Results for Julian Gray, Julian Gray (MRN [id number]) as of 08/24/2019 21:09  Ref. Range 08/24/2019 20:20  FIO2 Unknown 100.00  pH, Arterial Latest Ref Range: 7.350 - 7.450  7.409  pCO2 arterial Latest Ref Range: 32.0 - 48.0 mmHg 38.1  pO2, Arterial Latest Ref Range: 83.0 - 108.0 mmHg 385 (H)  Acid-base deficit Latest Ref Range: 0.0 - 2.0 mmol/L 0.2  Bicarbonate Latest Ref Range: 20.0 - 28.0 mmol/L 23.6  O2 Saturation Latest Units: % 99.6  Patient temperature Unknown 98.6

## 2019-08-24 NOTE — ED Notes (Signed)
Zackowski MD at bedside. 

## 2019-08-24 NOTE — Anesthesia Preprocedure Evaluation (Signed)
Anesthesia Evaluation  Patient identified by MRN, date of birth, ID band Patient awake    Reviewed: Allergy & Precautions, NPO status , Patient's Chart, lab work & pertinent test results  Airway Mallampati: Intubated  TM Distance: >3 FB     Dental  (+) Dental Advisory Given   Pulmonary neg pulmonary ROS,    breath sounds clear to auscultation       Cardiovascular negative cardio ROS   Rhythm:Regular Rate:Normal     Neuro/Psych Seizures -,  Severe nonverbal autism    GI/Hepatic Neg liver ROS, Esophageal perforation   Endo/Other  negative endocrine ROS  Renal/GU negative Renal ROS     Musculoskeletal   Abdominal   Peds  Hematology negative hematology ROS (+)   Anesthesia Other Findings   Reproductive/Obstetrics                             Lab Results  Component Value Date   WBC 11.7 (H) 08/24/2019   HGB 19.0 (H) 08/24/2019   HCT 56.0 (H) 08/24/2019   MCV 89.5 08/24/2019   PLT 237 08/24/2019   Lab Results  Component Value Date   CREATININE 0.80 08/24/2019   BUN 25 (H) 08/24/2019   NA 141 08/24/2019   K 4.3 08/24/2019   CL 106 08/24/2019   CO2 24 08/24/2019    Anesthesia Physical Anesthesia Plan  ASA: IV and emergent  Anesthesia Plan: General   Post-op Pain Management:    Induction: Intravenous  PONV Risk Score and Plan: 2 and Dexamethasone, Ondansetron and Treatment may vary due to age or medical condition  Airway Management Planned: Oral ETT and Double Lumen EBT  Additional Equipment: Arterial line  Intra-op Plan:   Post-operative Plan: Post-operative intubation/ventilation  Informed Consent: I have reviewed the patients History and Physical, chart, labs and discussed the procedure including the risks, benefits and alternatives for the proposed anesthesia with the patient or authorized representative who has indicated his/her understanding and acceptance.      Dental advisory given  Plan Discussed with: CRNA  Anesthesia Plan Comments:         Anesthesia Quick Evaluation

## 2019-08-24 NOTE — ED Provider Notes (Signed)
[facility]-EMERGENCY DEPT Provider Note   CSN: [id number] Arrival date & time: 08/24/19  1723     History Chief Complaint  Patient presents with  . Food Bolus    Choking    Julian Gray is a 34 y.o. male history MR, autism, epilepsy, nonverbal.  Patient presents today with his father following an episode of choking that occurred at home.  Father reports that patient was eating apples and chips when he had sudden onset of coughing and spitting up white sputum.  Father reports that patient initially seemed better and was at baseline until about 1 hour prior to arrival symptoms including multiple episodes of bilious emesis and coughing began.  Level 5 caveat nonverbal HPI     Past Medical History:  Diagnosis Date  . Epilepsy (HCC)   . Mental retardation   . Personal history of extracorporeal membrane oxygenation (ECMO)     Patient Active Problem List   Diagnosis Date Noted  . Pneumomediastinum (HCC) 08/24/2019  . 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 No pertinent surgical history.     Family History  Problem Relation Age of Onset  . Ataxia Neg Hx   . Chorea Neg Hx   . Dementia Neg Hx   . Mental retardation Neg Hx   . Migraines Neg Hx   . Multiple sclerosis Neg Hx   . Neurofibromatosis Neg Hx   . Neuropathy Neg Hx   . Parkinsonism Neg Hx   . Seizures Neg Hx   . Stroke Neg Hx     Social History   Tobacco Use  . Smoking status: Never Smoker  . Smokeless tobacco: Never Used  Substance Use Topics  . Alcohol use: No  . Drug use: No    Home Medications Prior to Admission medications   Medication Sig Start Date End Date Taking? Authorizing Provider  clonazePAM (KLONOPIN) 1 MG tablet Take 1 mg by mouth at bedtime as needed for sleep. 08/20/19  Yes [provider]  clozapine (CLOZARIL) 50 MG tablet Take 50 mg by mouth 3 (three) times daily. 08/07/19  Yes  [provider]  Fish Oil-Cholecalciferol (FISH OIL + D3 PO) Take by mouth daily.   Yes [provider]  Multiple Vitamin (MULTIVITAMIN) tablet Take 1 tablet by mouth daily.   Yes [provider]  naproxen sodium (ANAPROX) 220 MG tablet Take 220 mg by mouth 2 (two) times daily as needed (pain).    Yes [provider]  OLANZapine (ZYPREXA) 15 MG tablet Take 15 mg by mouth at bedtime.   Yes [provider]  oxcarbazepine (TRILEPTAL) 600 MG tablet Take 600 mg by mouth 2 (two) times daily.   Yes [provider]    Allergies    Patient has no known allergies.  Review of Systems   Review of Systems  Unable to perform ROS: Patient nonverbal    Physical Exam Updated Vital Signs BP (!) 172/124   Pulse (!) 105   Temp 98.4 F (36.9 C) (Oral)   Resp 14   Ht 6' (1.829 m)   Wt 106.6 kg   SpO2 100%   BMI 31.87 kg/m   Physical Exam Constitutional:      General: He is not in acute distress.    Appearance: Normal appearance. He is well-developed. He is obese. He is ill-appearing (Uncomfortable appearing). He is not diaphoretic.  HENT:     Head: Normocephalic and atraumatic.  Jaw: There is normal jaw occlusion.     Right Ear: External ear normal.     Left Ear: External ear normal.     Nose: Nose normal.     Mouth/Throat:     Mouth: Mucous membranes are moist.     Pharynx: Oropharynx is clear. Uvula midline.  Eyes:     General: Vision grossly intact. Gaze aligned appropriately.     Pupils: Pupils are equal, round, and reactive to light.  Neck:     Trachea: Trachea and phonation normal. No tracheal deviation.  Cardiovascular:     Rate and Rhythm: Regular rhythm. Tachycardia present.     Heart sounds: Normal heart sounds.  Pulmonary:     Effort: Pulmonary effort is normal. No tachypnea, accessory muscle usage or respiratory distress.     Breath sounds: No stridor. Decreased breath sounds (Shallow inspirations) present.  Chest:      Chest wall: No deformity or tenderness.  Abdominal:     General: There is no distension.     Palpations: Abdomen is soft.     Tenderness: There is no abdominal tenderness. There is no guarding or rebound.  Musculoskeletal:        General: Normal range of motion.     Cervical back: Normal range of motion and neck supple. No rigidity or crepitus.  Skin:    General: Skin is warm and dry.  Neurological:     Mental Status: He is alert. Mental status is at baseline.     GCS: GCS motor subscore is 6.  Psychiatric:        Behavior: Behavior normal.     ED Results / Procedures / Treatments   Labs (all labs ordered are listed, but only abnormal results are displayed) Labs Reviewed  CBC WITH DIFFERENTIAL/PLATELET - Abnormal; Notable for the following components:      Result Value   WBC 11.7 (*)    RBC 6.20 (*)    Hemoglobin 18.7 (*)    HCT 55.5 (*)    Neutro Abs 9.5 (*)    Monocytes Absolute 1.2 (*)    All other components within normal limits  BASIC METABOLIC PANEL - Abnormal; Notable for the following components:   Glucose, Bld 120 (*)    BUN 25 (*)    All other components within normal limits  HEPATIC FUNCTION PANEL - Abnormal; Notable for the following components:   Total Protein 8.5 (*)    Albumin 5.1 (*)    All other components within normal limits  I-STAT CHEM 8, ED - Abnormal; Notable for the following components:   BUN 25 (*)    Glucose, Bld 130 (*)    Hemoglobin 19.0 (*)    HCT 56.0 (*)    All other components within normal limits  RESPIRATORY PANEL BY RT PCR (FLU A&B, COVID)  LIPASE, BLOOD  BLOOD GAS, ARTERIAL  POC SARS CORONAVIRUS 2 AG -  ED  TYPE AND SCREEN    EKG None  Radiology CT Chest W Contrast  Result Date: 08/24/2019 CLINICAL DATA:  Choking on food. Hematemesis. Aspiration. Concern for esophageal perforation. EXAM: CT CHEST WITH CONTRAST TECHNIQUE: Multidetector CT imaging of the chest was performed during intravenous contrast administration.  CONTRAST:  68mL OMNIPAQUE IOHEXOL 300 MG/ML  SOLN COMPARISON:  Chest radiograph from earlier today. FINDINGS: Cardiovascular: Normal heart size. No significant pericardial effusion/thickening. Great vessels are normal in course and caliber. No central pulmonary emboli. Mediastinum/Nodes: No discrete thyroid nodules. There is severe right eccentric wall  thickening throughout the thoracic esophagus with pneumatosis in the right upper thoracic esophageal wall (series 2/image 27). There is pneumomediastinum throughout the high right paraesophageal mediastinum (series 2/image 32). There is ill-defined fluid and fat stranding throughout paraesophageal mediastinum, right greater than left. No pathologically enlarged axillary, mediastinal or hilar lymph nodes. Lungs/Pleura: No pneumothorax. Small dependent right pleural effusion. No left pleural effusion. Endotracheal tube tip is 0.2 cm above the carina. Patchy perihilar consolidation throughout the right lung, most prominent in the right upper lobe, with some associated volume loss. No lung masses or significant pulmonary nodules. Upper abdomen: No acute abnormality. Musculoskeletal: No aggressive appearing focal osseous lesions. Symmetric mild bilateral gynecomastia. IMPRESSION: 1. Low lying endotracheal tube with tip 0.2 cm above the carina, recommend retracting 1.5-2 cm. 2. Spectrum of findings compatible with acute esophageal perforation in the right upper thoracic esophagus with high right paraesophageal pneumomediastinum, high right esophageal wall pneumatosis, severe right eccentric esophageal wall thickening and evidence of paraesophageal mediastinitis. 3. Small dependent right pleural effusion. 4. Patchy right perihilar lung consolidation with some volume loss, favor a combination of atelectasis and aspiration. These results were called by telephone at the time of interpretation on 08/24/2019 at 7:35 pm to provider PA [facility] , who verbally acknowledged  these results. Electronically Signed   By: Ilona Sorrel M.D.   On: 08/24/2019 19:36   DG Chest Portable 1 View  Result Date: 08/24/2019 CLINICAL DATA:  Endotracheal tube placement. EXAM: PORTABLE CHEST 1 VIEW COMPARISON:  08/24/2019 at 5:51 p.m. FINDINGS: Endotracheal tube tip projects 1.5 cm above the carina. Cardiac silhouette is normal in size. No mediastinal or hilar masses. Possible pneumomediastinum suggested on the earlier exam is not evident on the current study. Hazy perihilar opacities noted on the prior study of are less evident. No new lung abnormalities. No pneumothorax. IMPRESSION: 1. Well-positioned endotracheal tube. 2. Mild improvement in perihilar airspace opacities noted on the prior study. There is no current evidence of pneumomediastinum. Electronically Signed   By: Lajean Manes M.D.   On: 08/24/2019 19:20   DG Chest Portable 1 View  Result Date: 08/24/2019 CLINICAL DATA:  Choked on apple.  Hypoxia. EXAM: PORTABLE CHEST 1 VIEW COMPARISON:  None. FINDINGS: Low lung volumes. Normal heart size. Possible pneumomediastinum in the high left mediastinum. No pneumothorax. No pleural effusion. Diffuse hazy parahilar opacity in both lungs. IMPRESSION: 1. Low lung volumes. Diffuse hazy parahilar opacity in both lungs, which could represent aspiration or atelectasis. 2. Possible pneumomediastinum in the high left mediastinum. Consider chest CT with IV and oral contrast for further evaluation. Electronically Signed   By: Ilona Sorrel M.D.   On: 08/24/2019 18:16    Procedures .Critical Care Performed by: Deliah Boston, PA-C Authorized by: Deliah Boston, PA-C   Critical care provider statement:    Critical care time (minutes):  45   Critical care was necessary to treat or prevent imminent or life-threatening deterioration of the following conditions: Esophageal perforation with mediastinitis.   Critical care was time spent personally by me on the following activities:  Discussions  with consultants, evaluation of patient's response to treatment, examination of patient, ordering and performing treatments and interventions, ordering and review of laboratory studies, ordering and review of radiographic studies, pulse oximetry, re-evaluation of patient's condition, obtaining history from patient or surrogate, review of old charts and development of treatment plan with patient or surrogate   (including critical care time)  Medications Ordered in ED Medications  Ampicillin-Sulbactam (UNASYN) 3 g in  sodium chloride 0.9 % 100 mL IVPB (3 g Intravenous New Bag/Given (Non-Interop) 08/24/19 1951)  propofol (DIPRIVAN) 1000 MG/100ML infusion (has no administration in time range)  propofol (DIPRIVAN) 1000 MG/100ML infusion (70 mcg/kg/min  106.6 kg Intravenous Rate/Dose Change 08/24/19 1945)  sodium chloride (PF) 0.9 % injection (has no administration in time range)  0.9 %  sodium chloride infusion (has no administration in time range)  fentaNYL (SUBLIMAZE) injection 50 mcg (50 mcg Intravenous Given 08/24/19 1818)  ondansetron (ZOFRAN) injection 4 mg (4 mg Intravenous Given 08/24/19 1818)  sodium chloride 0.9 % bolus 500 mL (500 mLs Intravenous New Bag/Given 08/24/19 1905)  iohexol (OMNIPAQUE) 300 MG/ML solution 75 mL (75 mLs Intravenous Contrast Given 08/24/19 1917)  etomidate (AMIDATE) injection (20 mg Intravenous Given 08/24/19 1857)  succinylcholine (ANECTINE) injection (100 mg Intravenous Given 08/24/19 1857)  etomidate (AMIDATE) injection (20 mg Intravenous Given 08/24/19 1907)    ED Course  I have reviewed the triage vital signs and the nursing notes.  Pertinent labs & imaging results that were available during my care of the patient were reviewed by me and considered in my medical decision making (see chart for details).  Clinical Course as of Aug 23 2004  Thu Aug 24, 2019  9323 Dr. Cornelius Moras.   [BM]  1940 Dr.  Cornelius Moras   [BM]    Clinical Course User Index [BM] Elizabeth Palau    MDM Rules/Calculators/A&P                     34 year old male arrives 3 hours after a choking episode while eating apples and chips.  Initially had improved but then worsened per father multiple episodes of bloody emesis and coughing.  On initial evaluation patient SPO2 90% on room air, tachycardic 120 bpm, uncomfortable appearing but no acute distress.  Patient placed on submental oxygen.  No stridor, oropharynx clear.  Is able to swallow with some discomfort, not drooling.  Chest x-ray and basic blood work ordered, discussed case with Dr. Deretha Emory who is seeing patient.  Fentanyl and Zofran ordered. - CXR: IMPRESSION:  1. Low lung volumes. Diffuse hazy parahilar opacity in both lungs,  which could represent aspiration or atelectasis.  2. Possible pneumomediastinum in the high left mediastinum. Consider  chest CT with IV and oral contrast for further evaluation.  - Consult called to cardiothoracic surgeon Dr. Cornelius Moras, advises that if patient has esophageal perforation he will need transfer to [facility], additionally if this is an impaction GI will need to be consulted. - Patient reassessed multiple times, appears more uncomfortable, grunting respirations.  Patient moved to resuscitation room.  Due to patient's baseline mental status and inability to obtain CT imaging patient will need to be sedated, Dr. Deretha Emory performing intubation to secure airway.  Consult called to pharmacist Jill Alexanders advises Unasyn for coverage of aspiration versus esophageal perforation. - CT chest:  IMPRESSION:  1. Low lying endotracheal tube with tip 0.2 cm above the carina,  recommend retracting 1.5-2 cm.  2. Spectrum of findings compatible with acute esophageal perforation  in the right upper thoracic esophagus with high right paraesophageal  pneumomediastinum, high right esophageal wall pneumatosis, severe  right eccentric esophageal wall thickening and evidence of  paraesophageal mediastinitis.  3. Small  dependent right pleural effusion.  4. Patchy right perihilar lung consolidation with some volume loss,  favor a combination of atelectasis and aspiration.   Rediscussed case with cardiothoracic surgeon Dr. Cornelius Moras, advises immediate transfer to [facility] for evaluation.  Bed request placed for 2 Heart.  [facility] contacted by charge RN, patient's father will be traveling with the patient to consent for emergency surgery.  Welton FlakesKyle R Novakowski was evaluated in Emergency Department on 08/24/2019 for the symptoms described in the history of present illness. He was evaluated in the context of the global COVID-19 pandemic, which necessitated consideration that the patient might be at risk for infection with the SARS-CoV-2 virus that causes COVID-19. Institutional protocols and algorithms that pertain to the evaluation of patients at risk for COVID-19 are in a state of rapid change based on information released by regulatory bodies including the CDC and federal and state organizations. These policies and algorithms were followed during the patient's care in the ED.   Note: Portions of this report may have been transcribed using voice recognition software. Every effort was made to ensure accuracy; however, inadvertent computerized transcription errors may still be present. Final Clinical Impression(s) / ED Diagnoses Final diagnoses:  Pneumomediastinum [facility](HCC)    Rx / DC Orders ED Discharge Orders    None       Elizabeth PalauMorelli, Tinya Cadogan A, PA-C 08/24/19 [place]orn[place]2006    Zackowski, Scott, MD 08/24/19 2024

## 2019-08-24 NOTE — ED Notes (Signed)
[facility] called for transport. 

## 2019-08-24 NOTE — Anesthesia Procedure Notes (Signed)
Arterial Line Insertion Start/End1/21/2021 9:41 PM, 08/24/2019 9:41 PM Performed by: Adonis Housekeeper, CRNA, CRNA  Patient location: OR. Preanesthetic checklist: patient identified, IV checked, site marked, risks and benefits discussed, surgical consent, monitors and equipment checked, pre-op evaluation, timeout performed and anesthesia consent Left, radial was placed Catheter size: 20 G Hand hygiene performed , maximum sterile barriers used  and Seldinger technique used Allen's test indicative of satisfactory collateral circulation Attempts: 2 Procedure performed without using ultrasound guided technique. Following insertion, dressing applied and Biopatch. Post procedure assessment: normal  Patient tolerated the procedure well with no immediate complications.

## 2019-08-25 ENCOUNTER — Inpatient Hospital Stay (HOSPITAL_COMMUNITY): Payer: Medicaid Other

## 2019-08-25 LAB — GLUCOSE, CAPILLARY
Glucose-Capillary: 101 mg/dL — ABNORMAL HIGH (ref 70–99)
Glucose-Capillary: 111 mg/dL — ABNORMAL HIGH (ref 70–99)
Glucose-Capillary: 117 mg/dL — ABNORMAL HIGH (ref 70–99)
Glucose-Capillary: 118 mg/dL — ABNORMAL HIGH (ref 70–99)
Glucose-Capillary: 118 mg/dL — ABNORMAL HIGH (ref 70–99)
Glucose-Capillary: 121 mg/dL — ABNORMAL HIGH (ref 70–99)
Glucose-Capillary: 196 mg/dL — ABNORMAL HIGH (ref 70–99)

## 2019-08-25 LAB — CBC
HCT: 48 % (ref 39.0–52.0)
Hemoglobin: 16.4 g/dL (ref 13.0–17.0)
MCH: 30.8 pg (ref 26.0–34.0)
MCHC: 34.2 g/dL (ref 30.0–36.0)
MCV: 90.1 fL (ref 80.0–100.0)
Platelets: 169 10*3/uL (ref 150–400)
RBC: 5.33 MIL/uL (ref 4.22–5.81)
RDW: 12.3 % (ref 11.5–15.5)
WBC: 10.2 10*3/uL (ref 4.0–10.5)
nRBC: 0 % (ref 0.0–0.2)

## 2019-08-25 LAB — POCT I-STAT 7, (LYTES, BLD GAS, ICA,H+H)
Acid-Base Excess: 5 mmol/L — ABNORMAL HIGH (ref 0.0–2.0)
Acid-Base Excess: 3 mmol/L — ABNORMAL HIGH (ref 0.0–2.0)
Acid-Base Excess: 1 mmol/L (ref 0.0–2.0)
Bicarbonate: 29.4 mmol/L — ABNORMAL HIGH (ref 20.0–28.0)
Bicarbonate: 29.4 mmol/L — ABNORMAL HIGH (ref 20.0–28.0)
Bicarbonate: 25 mmol/L (ref 20.0–28.0)
Calcium, Ion: 1.19 mmol/L (ref 1.15–1.40)
Calcium, Ion: 1.22 mmol/L (ref 1.15–1.40)
Calcium, Ion: 1.18 mmol/L (ref 1.15–1.40)
HCT: 48 % (ref 39.0–52.0)
HCT: 47 % (ref 39.0–52.0)
HCT: 47 % (ref 39.0–52.0)
Hemoglobin: 16.3 g/dL (ref 13.0–17.0)
Hemoglobin: 16 g/dL (ref 13.0–17.0)
Hemoglobin: 16 g/dL (ref 13.0–17.0)
O2 Saturation: 100 %
O2 Saturation: 98 %
O2 Saturation: 100 %
Patient temperature: 37.5
Patient temperature: 34.2
Patient temperature: 99.7
Potassium: 5.4 mmol/L — ABNORMAL HIGH (ref 3.5–5.1)
Potassium: 4.7 mmol/L (ref 3.5–5.1)
Potassium: 4 mmol/L (ref 3.5–5.1)
Sodium: 140 mmol/L (ref 135–145)
Sodium: 141 mmol/L (ref 135–145)
Sodium: 140 mmol/L (ref 135–145)
TCO2: 31 mmol/L (ref 22–32)
TCO2: 31 mmol/L (ref 22–32)
TCO2: 26 mmol/L (ref 22–32)
pCO2 arterial: 43.3 mmHg (ref 32.0–48.0)
pCO2 arterial: 45.1 mmHg (ref 32.0–48.0)
pCO2 arterial: 37.6 mmHg (ref 32.0–48.0)
pH, Arterial: 7.441 (ref 7.350–7.450)
pH, Arterial: 7.41 (ref 7.350–7.450)
pH, Arterial: 7.433 (ref 7.350–7.450)
pO2, Arterial: 165 mmHg — ABNORMAL HIGH (ref 83.0–108.0)
pO2, Arterial: 99 mmHg (ref 83.0–108.0)
pO2, Arterial: 165 mmHg — ABNORMAL HIGH (ref 83.0–108.0)

## 2019-08-25 LAB — BASIC METABOLIC PANEL
Anion gap: 12 (ref 5–15)
BUN: 19 mg/dL (ref 6–20)
CO2: 22 mmol/L (ref 22–32)
Calcium: 8.8 mg/dL — ABNORMAL LOW (ref 8.9–10.3)
Chloride: 105 mmol/L (ref 98–111)
Creatinine, Ser: 0.83 mg/dL (ref 0.61–1.24)
GFR calc Af Amer: >60 mL/min (ref >60)
GFR calc non Af Amer: >60 mL/min (ref >60)
Glucose, Bld: 139 mg/dL — ABNORMAL HIGH (ref 70–99)
Potassium: 3.9 mmol/L (ref 3.5–5.1)
Sodium: 139 mmol/L (ref 135–145)

## 2019-08-25 LAB — PHOSPHORUS: Phosphorus: 3.8 mg/dL (ref 2.5–4.6)

## 2019-08-25 LAB — TRIGLYCERIDES: Triglycerides: 222 mg/dL — ABNORMAL HIGH (ref <150)

## 2019-08-25 LAB — MAGNESIUM: Magnesium: 2 mg/dL (ref 1.7–2.4)

## 2019-08-25 LAB — ABO/RH: ABO/RH(D): A POS

## 2019-08-25 LAB — MRSA PCR SCREENING: MRSA by PCR: NEGATIVE

## 2019-08-25 MED ORDER — SODIUM CHLORIDE 0.9 % IV SOLN
1.5000 g | Freq: Three times a day (TID) | INTRAVENOUS | Status: DC
  Administered 2019-08-25: 1.5 g via INTRAVENOUS
  Filled 2019-08-25 (×3): qty 1.5

## 2019-08-25 MED ORDER — PIPERACILLIN-TAZOBACTAM 3.375 G IVPB
3.3750 g | Freq: Three times a day (TID) | INTRAVENOUS | Status: DC
  Administered 2019-08-25 – 2019-08-31 (×18): 3.375 g via INTRAVENOUS
  Filled 2019-08-25 (×18): qty 50

## 2019-08-25 MED ORDER — PRO-STAT SUGAR FREE PO LIQD
30.0000 mL | Freq: Two times a day (BID) | ORAL | Status: DC
  Administered 2019-08-25 – 2019-09-04 (×21): 30 mL
  Filled 2019-08-25 (×21): qty 30

## 2019-08-25 MED ORDER — CLONAZEPAM 0.1 MG/ML ORAL SUSPENSION
1.0000 mg | Freq: Every evening | ORAL | Status: DC | PRN
  Filled 2019-08-25: qty 10

## 2019-08-25 MED ORDER — NICARDIPINE HCL IN NACL 20-0.86 MG/200ML-% IV SOLN
5.0000 mg/h | INTRAVENOUS | Status: DC
  Administered 2019-08-25: 5 mg/h via INTRAVENOUS
  Filled 2019-08-25 (×2): qty 200

## 2019-08-25 MED ORDER — OSMOLITE 1.5 CAL PO LIQD
1000.0000 mL | ORAL | Status: DC
  Administered 2019-08-25 – 2019-09-03 (×9): 1000 mL
  Filled 2019-08-25 (×15): qty 1000

## 2019-08-25 MED ORDER — CHLORHEXIDINE GLUCONATE 0.12 % MT SOLN
15.0000 mL | Freq: Two times a day (BID) | OROMUCOSAL | Status: DC
  Administered 2019-08-25 – 2019-08-26 (×2): 15 mL via OROMUCOSAL
  Filled 2019-08-25: qty 15

## 2019-08-25 MED ORDER — CLONAZEPAM 0.5 MG PO TABS
1.0000 mg | ORAL_TABLET | Freq: Every evening | ORAL | Status: DC | PRN
  Administered 2019-08-26 – 2019-09-03 (×7): 1 mg
  Filled 2019-08-25 (×2): qty 2
  Filled 2019-08-25 (×2): qty 1
  Filled 2019-08-25: qty 2
  Filled 2019-08-25: qty 1
  Filled 2019-08-25 (×2): qty 2

## 2019-08-25 MED ORDER — ORAL CARE MOUTH RINSE: 15.0000 mL | Freq: Two times a day (BID) | OROMUCOSAL | Status: DC

## 2019-08-25 MED ORDER — CHLORHEXIDINE GLUCONATE CLOTH 2 % EX PADS
6.0000 | MEDICATED_PAD | Freq: Every day | CUTANEOUS | Status: DC
  Administered 2019-08-25 – 2019-08-28 (×4): 6 via TOPICAL

## 2019-08-25 MED ORDER — OXCARBAZEPINE 300 MG/5ML PO SUSP
600.0000 mg | Freq: Two times a day (BID) | ORAL | Status: DC
  Administered 2019-08-25 – 2019-09-04 (×21): 600 mg
  Filled 2019-08-25 (×24): qty 10

## 2019-08-25 MED ORDER — ORAL CARE MOUTH RINSE
15.0000 mL | OROMUCOSAL | Status: DC
  Administered 2019-08-25 (×2): 15 mL via OROMUCOSAL

## 2019-08-25 MED ORDER — CLOZAPINE 25 MG PO TABS
50.0000 mg | ORAL_TABLET | Freq: Three times a day (TID) | ORAL | Status: DC
  Administered 2019-08-25 – 2019-09-04 (×28): 50 mg
  Filled 2019-08-25 (×30): qty 2

## 2019-08-25 MED ORDER — CLOZAPINE 25 MG PO TABS
50.0000 mg | ORAL_TABLET | Freq: Three times a day (TID) | ORAL | Status: DC
  Administered 2019-08-25: 50 mg
  Filled 2019-08-25 (×3): qty 2

## 2019-08-25 MED ORDER — OLANZAPINE 5 MG PO TABS
15.0000 mg | ORAL_TABLET | Freq: Every day | ORAL | Status: DC
  Administered 2019-08-26 – 2019-09-03 (×10): 15 mg
  Filled 2019-08-25 (×6): qty 3
  Filled 2019-08-25: qty 2
  Filled 2019-08-25 (×6): qty 3
  Filled 2019-08-25: qty 2
  Filled 2019-08-25 (×2): qty 3

## 2019-08-25 MED ORDER — CHLORHEXIDINE GLUCONATE 0.12% ORAL RINSE (MEDLINE KIT)
15.0000 mL | Freq: Two times a day (BID) | OROMUCOSAL | Status: DC
  Administered 2019-08-25: 08:00:00 15 mL via OROMUCOSAL

## 2019-08-25 NOTE — Progress Notes (Signed)
Patient was biting on ETT, bite blocker was inserted. Tolerated well, no complications. Vitals stable.

## 2019-08-25 NOTE — Progress Notes (Signed)
Initial Nutrition Assessment  DOCUMENTATION CODES:   Not applicable  INTERVENTION:   Tube feeding:  -Osmolite 1.5 @ 20 ml/hr via PEG -Increase by 10 ml Q4 hours to goal rate of 60 ml/hr (1440 ml) -30 ml Prostat BID  Provides: 2360 kcals, 120 grams protein, 1097 ml free water.   NUTRITION DIAGNOSIS:   Inadequate oral intake related to inability to eat as evidenced by NPO status.  GOAL:   Patient will meet greater than or equal to 90% of their needs  MONITOR:   Diet advancement, Skin, TF tolerance, Weight trends, Labs, I & O's  REASON FOR ASSESSMENT:   Rounds    ASSESSMENT:   Patient with PMH significant for MR and epilepsy. Presents this admission with esophageal perforation after choking on an apple.   1/22- s/p emergent esophageal stent and PEG placement  Pt discussed during ICU rounds and with RN.   Pt extubated this afternoon. Spoke with dad at bedside who is main caregiver. Denies pt had loss in appetite PTA. Typically eats three meals daily that consist of B- eggs or breakfast bar L- deli sandwich with soup, cookie D-meat, vegetable, grain. Pt is able to feed himself and dad often has to limit portion sizes as he "has a large appetite." Discussed next steps for feeding tube and that we would titrate TF to goal. Okay per TCTS.   Dad endorses pt's UBW stays around 235 lb and denies unintentional weight loss. Records indicate pt weighed 232 lb on 08/31/2018 at [facility] and 220 lb this admission (insignificant for time frame). Per dad, pt able to walk around but falls at least daily due to instability.    I/O: +2,303 ml since admit  UOP: 630 ml x 24 hrs Chest tube: 40 ml x 24 hrs    Drips: NS @ 100 ml/hr  Medications: dulcolax, SS novolog, senokot Labs: CBG 111-196  NUTRITION - FOCUSED PHYSICAL EXAM:    Most Recent Value  Orbital Region  No depletion  Upper Arm Region  No depletion  Thoracic and Lumbar Region  Unable to assess  Buccal Region  No depletion   Temple Region  No depletion  Clavicle Bone Region  No depletion  Clavicle and Acromion Bone Region  No depletion  Scapular Bone Region  No depletion  Dorsal Hand  No depletion  Patellar Region  No depletion  Anterior Thigh Region  No depletion  Posterior Calf Region  No depletion  Edema (RD Assessment)  None  Hair  Reviewed  Eyes  Reviewed  Mouth  Unable to assess  Skin  Reviewed  Nails  Reviewed     Diet Order:   Diet Order            Diet NPO time specified  Diet effective now              EDUCATION NEEDS:   Not appropriate for education at this time  Skin:  Skin Assessment: Skin Integrity Issues: Skin Integrity Issues:: Incisions Incisions: abdomen/chest  Last BM:  PTA  Height:   Ht Readings from Last 1 Encounters:  08/24/19 6' (1.829 m)    Weight:   Wt Readings from Last 1 Encounters:  08/25/19 100 kg    Ideal Body Weight:  80.9 kg  BMI:  Body mass index is 29.9 kg/m.  Estimated Nutritional Needs:   Kcal:  2200-2400 kcal  Protein:  110-125 grams  Fluid:  >/= 2.2 L/day   Vanessa Kick RD, LDN Clinical Nutrition Pager # -  [phone]  

## 2019-08-25 NOTE — H&P (Signed)
[address]       [place] [address]             [phone]                    Julian Gray [facility] Medical Record #[id number] Date of Birth: [birth year]  Referring: No ref. provider found Primary Care: Tisovec, Adelfa Koh, MD Primary Cardiologist: No primary care provider on file.  Chief Complaint:    Chief Complaint  Patient presents with  . Food Bolus    Choking    History of Present Illness:    Julian Gray 34 y.o. male with history of anoxic brain injury presented to [facility] long hospital after several episodes of bloody emesis.  His father states that after eating some apples and potato chips he had retching spell, and about an hour later he threw up bright red blood.  On the way to the hospital he had several episodes of emesis.  Cross-sectional imaging was concerning for an esophageal perforation.  CT surgery was consulted to assist with management    Past Medical History:  Diagnosis Date  . Epilepsy (HCC)   . Mental retardation   . Personal history of extracorporeal membrane oxygenation (ECMO)     History reviewed. No pertinent surgical history.  Family History  Problem Relation Age of Onset  . Ataxia Neg Hx   . Chorea Neg Hx   . Dementia Neg Hx   . Mental retardation Neg Hx   . Migraines Neg Hx   . Multiple sclerosis Neg Hx   . Neurofibromatosis Neg Hx   . Neuropathy Neg Hx   . Parkinsonism Neg Hx   . Seizures Neg Hx   . Stroke Neg Hx      Social History   Tobacco Use  Smoking Status Never Smoker  Smokeless Tobacco Never Used    Social History   Substance and Sexual Activity  Alcohol Use No     No Known Allergies  Current Facility-Administered Medications  Medication Dose Route Frequency Provider Last Rate Last Admin  . 0.9 %  sodium chloride infusion   Intravenous Continuous Vanetta Mulders, MD      . 0.9 %  sodium chloride infusion   Intra-arterial PRN Melysa Schroyer, Eliezer Lofts, MD      . bisacodyl (DULCOLAX) EC  tablet 10 mg  10 mg Oral Daily Amaal Dimartino O, MD      . cefUROXime (ZINACEF) injection 1.5 g  1.5 g Intravenous Q8H Antoino Westhoff O, MD      . dexmedetomidine (PRECEDEX) 200 MCG/50ML (4 mcg/mL) infusion  0.4-1.2 mcg/kg/hr Intravenous Titrated Abb Gobert O, MD      . fluconazole (DIFLUCAN) IVPB 200 mg  200 mg Intravenous Q24H Montina Dorrance O, MD      . HYDROmorphone (DILAUDID) injection 1 mg  1 mg Intravenous Q30 min PRN Abimbola Aki O, MD      . HYDROmorphone (DILAUDID) injection 1-3 mg  1-3 mg Intravenous Q1H PRN Raistlin Gum O, MD      . insulin aspart (novoLOG) injection 0-24 Units  0-24 Units Subcutaneous Q4H Ron Beske O, MD      . ondansetron (ZOFRAN) injection 4 mg  4 mg Intravenous Q6H PRN Brittny Spangle O, MD      . propofol (DIPRIVAN) 1000 MG/100ML infusion           . propofol (DIPRIVAN) 1000 MG/100ML infusion  5-80 mcg/kg/min Intravenous Continuous Zackowski,  Scott, MD 44.8 mL/hr at 08/24/19 1945 70 mcg/kg/min at 08/24/19 1945  . senna-docusate (Senokot-S) tablet 1 tablet  1 tablet Oral QHS Valma Rotenberg O, MD      . sodium chloride (PF) 0.9 % injection           . sodium chloride 0.9 % with cefUROXime (ZINACEF) ADS Med            Facility-Administered Medications Ordered in Other Encounters  Medication Dose Route Frequency Provider Last Rate Last Admin  . cefUROXime (ZINACEF) 1.5 g in sodium chloride 0.9 % 100 mL IVPB   Intravenous Continuous PRN Derinda Sis D, CRNA   1.5 g at 08/24/19 2156  . fentaNYL (SUBLIMAZE) injection   Intravenous Anesthesia Intra-op Jearld Pies, CRNA   100 mcg at 08/24/19 2340  . lactated ringers infusion   Intravenous Continuous PRN Jearld Pies, CRNA   New Bag at 08/24/19 2128  . lactated ringers infusion   Intravenous Continuous PRN Jearld Pies, CRNA   Stopped at 08/24/19 2339  . phenylephrine (NEOSYNEPHRINE) 10-0.9 MG/250ML-% infusion   Intravenous Continuous PRN Jearld Pies, CRNA   Stopped at 08/24/19 2201  . propofol (DIPRIVAN) 500 MG/50ML infusion   Intravenous Continuous PRN Jearld Pies, CRNA 47.97 mL/hr at 08/24/19 2340 75 mcg/kg/min at 08/24/19 2340  . rocuronium bromide 10 mg/mL (PF) syringe   Intravenous Anesthesia Intra-op Jearld Pies, CRNA   50 mg at 08/24/19 2325    Review of Systems  Unable to perform ROS: Critical illness  Gastrointestinal: Positive for heartburn.    PHYSICAL EXAMINATION: BP (!) 154/93   Pulse 96   Temp 98.4 F (36.9 C) (Oral)   Resp 16   Ht 6' (1.829 m)   Wt 106.6 kg   SpO2 100%   BMI 31.87 kg/m   Physical Exam  Constitutional:  Intubated  HENT:  Head: Normocephalic and atraumatic.  Cardiovascular:  Sinus tachycardia  Pulmonary/Chest: Effort normal and breath sounds normal.  Right chest wall healed incision  Abdominal: Soft. He exhibits no distension.  Skin: Skin is warm and dry.     Diagnostic Studies & Laboratory data:     Recent Radiology Findings:   CT Chest W Contrast  Result Date: 08/24/2019 CLINICAL DATA:  Choking on food. Hematemesis. Aspiration. Concern for esophageal perforation. EXAM: CT CHEST WITH CONTRAST TECHNIQUE: Multidetector CT imaging of the chest was performed during intravenous contrast administration. CONTRAST:  23mL OMNIPAQUE IOHEXOL 300 MG/ML  SOLN COMPARISON:  Chest radiograph from earlier today. FINDINGS: Cardiovascular: Normal heart size. No significant pericardial effusion/thickening. Great vessels are normal in course and caliber. No central pulmonary emboli. Mediastinum/Nodes: No discrete thyroid nodules. There is severe right eccentric wall thickening throughout the thoracic esophagus with pneumatosis in the right upper thoracic esophageal wall (series 2/image 27). There is pneumomediastinum throughout the high right paraesophageal mediastinum (series 2/image 32). There is ill-defined fluid and fat stranding throughout paraesophageal mediastinum, right greater  than left. No pathologically enlarged axillary, mediastinal or hilar lymph nodes. Lungs/Pleura: No pneumothorax. Small dependent right pleural effusion. No left pleural effusion. Endotracheal tube tip is 0.2 cm above the carina. Patchy perihilar consolidation throughout the right lung, most prominent in the right upper lobe, with some associated volume loss. No lung masses or significant pulmonary nodules. Upper abdomen: No acute abnormality. Musculoskeletal: No aggressive appearing focal osseous lesions. Symmetric mild bilateral gynecomastia. IMPRESSION: 1. Low lying endotracheal tube with tip 0.2 cm above the carina, recommend retracting 1.5-2 cm. 2.  Spectrum of findings compatible with acute esophageal perforation in the right upper thoracic esophagus with high right paraesophageal pneumomediastinum, high right esophageal wall pneumatosis, severe right eccentric esophageal wall thickening and evidence of paraesophageal mediastinitis. 3. Small dependent right pleural effusion. 4. Patchy right perihilar lung consolidation with some volume loss, favor a combination of atelectasis and aspiration. These results were called by telephone at the time of interpretation on 08/24/2019 at 7:35 pm to provider PA [facility] , who verbally acknowledged these results. Electronically Signed   By: Delbert Phenix M.D.   On: 08/24/2019 19:36   DG Chest Portable 1 View  Result Date: 08/24/2019 CLINICAL DATA:  Endotracheal tube placement. EXAM: PORTABLE CHEST 1 VIEW COMPARISON:  08/24/2019 at 5:51 p.m. FINDINGS: Endotracheal tube tip projects 1.5 cm above the carina. Cardiac silhouette is normal in size. No mediastinal or hilar masses. Possible pneumomediastinum suggested on the earlier exam is not evident on the current study. Hazy perihilar opacities noted on the prior study of are less evident. No new lung abnormalities. No pneumothorax. IMPRESSION: 1. Well-positioned endotracheal tube. 2. Mild improvement in perihilar  airspace opacities noted on the prior study. There is no current evidence of pneumomediastinum. Electronically Signed   By: Amie Portland M.D.   On: 08/24/2019 19:20   DG Chest Portable 1 View  Result Date: 08/24/2019 CLINICAL DATA:  Choked on apple.  Hypoxia. EXAM: PORTABLE CHEST 1 VIEW COMPARISON:  None. FINDINGS: Low lung volumes. Normal heart size. Possible pneumomediastinum in the high left mediastinum. No pneumothorax. No pleural effusion. Diffuse hazy parahilar opacity in both lungs. IMPRESSION: 1. Low lung volumes. Diffuse hazy parahilar opacity in both lungs, which could represent aspiration or atelectasis. 2. Possible pneumomediastinum in the high left mediastinum. Consider chest CT with IV and oral contrast for further evaluation. Electronically Signed   By: Delbert Phenix M.D.   On: 08/24/2019 18:16   DG C-Arm 1-60 Min-No Report  Result Date: 08/24/2019 Fluoroscopy was utilized by the requesting physician.  No radiographic interpretation.       I have independently reviewed the above radiology studies  and reviewed the findings with the patient.   Recent Lab Findings: Lab Results  Component Value Date   WBC 11.7 (H) 08/24/2019   HGB 19.0 (H) 08/24/2019   HCT 56.0 (H) 08/24/2019   PLT 237 08/24/2019   GLUCOSE 130 (H) 08/24/2019   ALT 26 08/24/2019   AST 20 08/24/2019   NA 141 08/24/2019   K 4.3 08/24/2019   CL 106 08/24/2019   CREATININE 0.80 08/24/2019   BUN 25 (H) 08/24/2019   CO2 24 08/24/2019         Assessment / Plan:   34 year old male with history of anoxic brain injury and is nonverbal who presents with an esophageal perforation.  He will be taken to the operating theater emergently for an EGD PEG tube placement esophageal stent, and right VATS.      Katrinka Herbison O T[place] Igoe 08/25/2019 12:00 AM

## 2019-08-25 NOTE — Op Note (Signed)
[address]       [name] [address]             [phone]        08/25/2019  Patient:  Julian Gray Pre-Op Dx: Esophageal perforation Post-op Dx: Esophageal perforation secondary to impacted food Procedure: -Esophagogastroscopy -Placement of a 24 French percutaneous gastrostomy tube -Placement of a 28 x 125 mm partially covered esophageal stent -Bronchoscopy -Placement of a 28 French Argyle chest tube   Surgeon and Role:      * Izen Petz, Eliezer Lofts, MD - Primary    *Dr. Cornelius Moras, MD- assisting  Anesthesia  general EBL: Minimal  Blood Administration: None Specimen: None  Drains: 63 F argyle chest tube in right chest Counts: correct   Indications: 34 year old male was admitted from the emergency department following diagnosis of an esophageal perforation.  He was intubated for airway protection in the emergency department.  Findings: There was an impacted piece of apple noted in mid esophagus.  After we mobilized this down into the stomach on retrograde evaluation there was an esophageal perforation noted at approximately 27 cm from the incisors.  We achieve good seal with the esophageal stent.  The patient was noted to have enteric contents above his glottis as well as enteric content bubbling up from below the tracheal cuff.  On bronchoscopy the endobronchial surface appeared inflamed.  There was copious secretions but no obvious enteric content.  The chest x-ray revealed good placement of the chest tube and some parenchymal infiltrates.  Operative Technique: After the risks, benefits and alternatives were thoroughly discussed, the patient was brought to the operative theatre.  Anesthesia was induced, an ann appropriate surgical pause was performed, and pre-operative antibiotics were dosed accordingly.  The esophageal gastroscope was passed through the oropharynx down into the cervical esophagus.  As we advanced more distally a large piece of apple  was noted in the mid esophagus.  We were able to push this down using the gastroscope into the stomach.  The stomach was then insufflated and after compression of the abdominal wall and confirmation of transillumination began placing our percutaneous gastrostomy tube.  The needle was passed through the skin and grasped with an Endo grasper.  The wire was then retracted using the gastroscope through the oropharynx.  The PEG tube was in place on the wire and placed through the oropharynx back down into the stomach.  It was secured in place under direct visualization.  Next Jag wire was passed through the endoscope into the stomach.  And then we positioned the endoscope using fluoroscopic guidance.  We visualize the endostent and had good coverage of the perforation.  Next we had originally planned to perform a right thoracoscopy but during exchange of the endotracheal tube enteric content was noted at the epiglottis.  There appeared to be large chunks of apples.  There was also some enteric content bubbling up from under the endotracheal cuff.  For this reason we elected to not exchange the tube but the patient became acutely hypoxic and had decreased breath sounds on the right.  A 28 French chest tube was placed and multiple adhesions were noted upon entry of the chest.  There was no large gush of air however his saturations and breath sounds improved.  We confirmed positioning with a chest x-ray.   The patient tolerated the procedure without any immediate complications, and was transferred to the ICU in stable condition.  Jamorion Gomillion Keane Scrape

## 2019-08-25 NOTE — Progress Notes (Signed)
      [address]       [name] [address]             [phone]                 1 Day Post-Op Procedure(s) (LRB): ESOPHAGOGASTRODUODENOSCOPY (EGD) percutaneous gastrostomy tube placement, esophageal stent placement, right chest tube placement (Right) PERCUTANEOUS INSERTION OF GASTROSTOMY TUBE (N/A) CHEST TUBE INSERTION (Right)   Events: Extubated today _______________________________________________________________ Vitals: BP (!) 149/88   Pulse (!) 103   Temp 100 F (37.8 C) (Oral)   Resp 19   Ht 6' (1.829 m)   Wt 100 kg   SpO2 100%   BMI 29.90 kg/m   - Neuro: alert, NAD  - Cardiovascular: tachy  .      - Pulm: EWOB, no leak on CT  ABG    Component Value Date/Time   PHART 7.433 08/25/2019 0508   PCO2ART 37.6 08/25/2019 0508   PO2ART 165.0 (H) 08/25/2019 0508   HCO3 25.0 08/25/2019 0508   TCO2 26 08/25/2019 0508   ACIDBASEDEF 0.2 08/24/2019 2020   O2SAT 100.0 08/25/2019 0508    - Abd: soft, peg tube in place - Extremity: trace edema  .Intake/Output      01/21 0701 - 01/22 0700 01/22 0701 - 01/23 0700   I.V. (mL/kg) 1684.8 (16.8) 1671.5 (16.7)   Other  300   IV Piggyback 900 9.4   Total Intake(mL/kg) 2584.9 (25.8) 1980.9 (19.8)   Urine (mL/kg/hr) 630 175 (0.2)   Drains 125 125   Chest Tube 40    Total Output 795 300   Net +1789.9 +1680.9           _______________________________________________________________ Labs: CBC Latest Ref Rng & Units 08/25/2019 08/25/2019 08/24/2019  WBC 4.0 - 10.5 K/uL 10.2 - -  Hemoglobin 13.0 - 17.0 g/dL 46.9 62.9 52.8  Hematocrit 39.0 - 52.0 % 48.0 47.0 47.0  Platelets 150 - 400 K/uL 169 - -   CMP Latest Ref Rng & Units 08/25/2019 08/25/2019 08/24/2019  Glucose 70 - 99 mg/dL 413(K) - -  BUN 6 - 20 mg/dL 19 - -  Creatinine 4.40 - 1.24 mg/dL 1.02 - -  Sodium 725 - 145 mmol/L 139 140 141  Potassium 3.5 - 5.1 mmol/L 3.9 4.0 4.7  Chloride 98 - 111 mmol/L 105 - -  CO2 22 - 32 mmol/L 22 - -  Calcium  8.9 - 10.3 mg/dL 3.6(U) - -  Total Protein 6.5 - 8.1 g/dL - - -  Total Bilirubin 0.3 - 1.2 mg/dL - - -  Alkaline Phos 38 - 126 U/L - - -  AST 15 - 41 U/L - - -  ALT 0 - 44 U/L - - -    CXR: stable  _______________________________________________________________  Assessment and Plan: POD 1 s/p PEG tube, esophageal stent  Neuro: anoxic brain injury. At baseline per father.  CV: sinus tach, htn due to home meds per father Pulm: continue pulm toilet.  Will keep CT until esophagram Renal: creat stable.  Low uop.  Will stop IV fluids, once on tubefeeds GI: starting tube feeds today.  NPO for now Heme: stable ID: continue zosyn and diflucan Endo: SSI Dispo: continue ICU care, mostly for mental status  Brynda Greathouse, MD 08/25/2019 4:14 PM

## 2019-08-25 NOTE — Plan of Care (Signed)
  Problem: Activity: Goal: Ability to tolerate increased activity will improve Outcome: Progressing   Problem: Respiratory: Goal: Ability to maintain a clear airway and adequate ventilation will improve Outcome: Adequate for Discharge   Problem: Role Relationship: Goal: Method of communication will improve Outcome: Adequate for Discharge   Problem: Education: Goal: Knowledge of General Education information will improve Description: Including pain rating scale, medication(s)/side effects and non-pharmacologic comfort measures Outcome: Progressing   Problem: Health Behavior/Discharge Planning: Goal: Ability to manage health-related needs will improve Outcome: Progressing   Problem: Clinical Measurements: Goal: Ability to maintain clinical measurements within normal limits will improve Outcome: Progressing Goal: Respiratory complications will improve Outcome: Progressing Goal: Cardiovascular complication will be avoided Outcome: Progressing   Problem: Activity: Goal: Risk for activity intolerance will decrease Outcome: Progressing   Problem: Pain Managment: Goal: General experience of comfort will improve Outcome: Progressing   Problem: Safety: Goal: Ability to remain free from injury will improve Outcome: Adequate for Discharge   Problem: Skin Integrity: Goal: Risk for impaired skin integrity will decrease Outcome: Progressing   Problem: Education: Goal: Knowledge of the prescribed therapeutic regimen will improve Outcome: Progressing   Problem: Bowel/Gastric: Goal: Gastrointestinal status for postoperative course will improve Outcome: Progressing   Problem: Clinical Measurements: Goal: Postoperative complications will be avoided or minimized Outcome: Progressing   Problem: Respiratory: Goal: Ability to maintain a clear airway will improve Outcome: Adequate for Discharge

## 2019-08-25 NOTE — Progress Notes (Signed)
Orthopedic Tech Progress Note Patient Details:  Julian Gray 1986-01-13 [id number] RN called requesting a ABDOMINAL BINDER for patient. Applied while patient sat on the side of bed Ortho Devices Type of Ortho Device: Abdominal binder Ortho Device/Splint Location: stomach Ortho Device/Splint Interventions: Ordered, Application   Post Interventions Patient Tolerated: Well Instructions Provided: Care of device, Adjustment of device   Donald Pore 08/25/2019, 1:09 PM

## 2019-08-25 NOTE — Progress Notes (Signed)
      [address]       [name] [address]             [phone]      POD # 1 esophageal stent  Febrile to 100.6 earlier 2L [place] 93%  BP (!) 149/88 (BP Location: Left Arm)   Pulse (!) 103   Temp 100 F (37.8 C) (Oral)   Resp 19   Ht 6' (1.829 m)   Wt 100 kg   SpO2 100%   BMI 29.90 kg/m  Off nicardipine CBG well controlled after being elevated earlier  Continue current Rx  Steven C. Dorris Fetch, MD [facility] [phone]

## 2019-08-25 NOTE — Transfer of Care (Signed)
Immediate Anesthesia Transfer of Care Note  Patient: Julian Gray  Procedure(s) Performed: ESOPHAGOGASTRODUODENOSCOPY (EGD) percutaneous gastrostomy tube placement, esophageal stent placement, right chest tube placement (Right ) PERCUTANEOUS INSERTION OF GASTROSTOMY TUBE (N/A ) CHEST TUBE INSERTION (Right )  Patient Location: PACU and ICU  Anesthesia Type:General  Level of Consciousness: Patient remains intubated per anesthesia plan  Airway & Oxygen Therapy: Patient remains intubated per anesthesia plan and Patient placed on Ventilator (see vital sign flow sheet for setting)  Post-op Assessment: Report given to RN and Post -op Vital signs reviewed and stable  Post vital signs: Reviewed and stable  Last Vitals:  Vitals Value Taken Time  BP 157/83 ABP   Temp    Pulse 89 08/25/19 0005  Resp 38 08/25/19 0005  SpO2 100 % 08/25/19 0005  Vitals shown include unvalidated device data.  Last Pain:  Vitals:   08/24/19 1749  TempSrc: Oral     Transported to ICU with Sampson Goon MD, VSS, Propofol gtt currently infusing, report to RN at bedside, RT present, applied to [facility] - 100% FiO2, bilateral breath sounds present. Lightfoot MD present at bedside, transfer of care in stable position.    Complications: No apparent anesthesia complications

## 2019-08-25 NOTE — Procedures (Signed)
Extubation Procedure Note  Patient Details:   Name: Julian Gray DOB: [birth year] MRN: [id number]   Airway Documentation:    Vent end date: 08/25/19 Vent end time: 1125   Evaluation  O2 sats: stable throughout Complications: No apparent complications Patient did tolerate procedure well. Bilateral Breath Sounds: Diminished   Yes   Patient is extubated to 4lnc. Vital signs stable at this time. No complications. RT will continue to monitor.  Ave Filter 08/25/2019, 11:44 AM

## 2019-08-25 NOTE — Anesthesia Postprocedure Evaluation (Addendum)
Anesthesia Post Note  Patient: Julian Gray  Procedure(s) Performed: ESOPHAGOGASTRODUODENOSCOPY (EGD) percutaneous gastrostomy tube placement, esophageal stent placement, right chest tube placement (Right ) PERCUTANEOUS INSERTION OF GASTROSTOMY TUBE (N/A ) CHEST TUBE INSERTION (Right )     Patient location during evaluation: ICU Anesthesia Type: General Level of consciousness: sedated and patient remains intubated per anesthesia plan Pain management: pain level controlled Vital Signs Assessment: post-procedure vital signs reviewed and stable Respiratory status: patient remains intubated per anesthesia plan Cardiovascular status: blood pressure returned to baseline and stable Postop Assessment: no apparent nausea or vomiting Anesthetic complications: no    Last Vitals:  Vitals:   08/25/19 0358 08/25/19 0400  BP:    Pulse: 92   Resp: 16   Temp:  37.6 C  SpO2: 100%     Last Pain:  Vitals:   08/25/19 0400  TempSrc: Axillary                 Kennieth Rad

## 2019-08-26 LAB — COMPREHENSIVE METABOLIC PANEL
ALT: 17 U/L (ref 0–44)
AST: 25 U/L (ref 15–41)
Albumin: 3.5 g/dL (ref 3.5–5.0)
Alkaline Phosphatase: 76 U/L (ref 38–126)
Anion gap: 12 (ref 5–15)
BUN: 16 mg/dL (ref 6–20)
CO2: 23 mmol/L (ref 22–32)
Calcium: 8.9 mg/dL (ref 8.9–10.3)
Chloride: 105 mmol/L (ref 98–111)
Creatinine, Ser: 0.85 mg/dL (ref 0.61–1.24)
GFR calc Af Amer: >60 mL/min (ref >60)
GFR calc non Af Amer: >60 mL/min (ref >60)
Glucose, Bld: 123 mg/dL — ABNORMAL HIGH (ref 70–99)
Potassium: 4.2 mmol/L (ref 3.5–5.1)
Sodium: 140 mmol/L (ref 135–145)
Total Bilirubin: 0.8 mg/dL (ref 0.3–1.2)
Total Protein: 6.3 g/dL — ABNORMAL LOW (ref 6.5–8.1)

## 2019-08-26 LAB — PHOSPHORUS
Phosphorus: 2.9 mg/dL (ref 2.5–4.6)
Phosphorus: 2.2 mg/dL — ABNORMAL LOW (ref 2.5–4.6)

## 2019-08-26 LAB — CBC
HCT: 47.4 % (ref 39.0–52.0)
Hemoglobin: 15.7 g/dL (ref 13.0–17.0)
MCH: 30.1 pg (ref 26.0–34.0)
MCHC: 33.1 g/dL (ref 30.0–36.0)
MCV: 90.8 fL (ref 80.0–100.0)
Platelets: 159 10*3/uL (ref 150–400)
RBC: 5.22 MIL/uL (ref 4.22–5.81)
RDW: 12.8 % (ref 11.5–15.5)
WBC: 9.7 10*3/uL (ref 4.0–10.5)
nRBC: 0 % (ref 0.0–0.2)

## 2019-08-26 LAB — MAGNESIUM
Magnesium: 2.2 mg/dL (ref 1.7–2.4)
Magnesium: 2 mg/dL (ref 1.7–2.4)

## 2019-08-26 LAB — GLUCOSE, CAPILLARY
Glucose-Capillary: 122 mg/dL — ABNORMAL HIGH (ref 70–99)
Glucose-Capillary: 148 mg/dL — ABNORMAL HIGH (ref 70–99)
Glucose-Capillary: 192 mg/dL — ABNORMAL HIGH (ref 70–99)
Glucose-Capillary: 136 mg/dL — ABNORMAL HIGH (ref 70–99)
Glucose-Capillary: 126 mg/dL — ABNORMAL HIGH (ref 70–99)

## 2019-08-26 MED ORDER — ORAL CARE MOUTH RINSE: 15.0000 mL | Freq: Two times a day (BID) | OROMUCOSAL | Status: DC

## 2019-08-26 MED ORDER — ORAL CARE MOUTH RINSE
15.0000 mL | Freq: Two times a day (BID) | OROMUCOSAL | Status: DC
  Administered 2019-08-26 – 2019-09-01 (×7): 15 mL via OROMUCOSAL

## 2019-08-26 NOTE — Progress Notes (Signed)
2 Days Post-Op Procedure(s) (LRB): ESOPHAGOGASTRODUODENOSCOPY (EGD) percutaneous gastrostomy tube placement, esophageal stent placement, right chest tube placement (Right) PERCUTANEOUS INSERTION OF GASTROSTOMY TUBE (N/A) CHEST TUBE INSERTION (Right) Subjective: awake  Objective: Vital signs in last 24 hours: Temp:  [97.8 F (36.6 C)-101.3 F (38.5 C)] 97.8 F (36.6 C) (01/23 0715) Pulse Rate:  [64-110] 92 (01/23 0600) Cardiac Rhythm: Sinus tachycardia (01/23 0400) Resp:  [13-23] 22 (01/23 0700) BP: (101-159)/(64-94) 122/79 (01/23 0700) SpO2:  [91 %-100 %] 95 % (01/23 0600) Arterial Line BP: (108-149)/(49-66) 149/66 (01/22 1100) FiO2 (%):  [40 %] 40 % (01/22 1023) Weight:  [102.3 kg] 102.3 kg (01/23 0500)  Hemodynamic parameters for last 24 hours:    Intake/Output from previous day: 01/22 0701 - 01/23 0700 In: 2753.4 [I.V.:1793.6; NG/GT:477; IV Piggyback:182.9] Out: 630 [Urine:450; Drains:125; Chest Tube:55] Intake/Output this shift: No intake/output data recorded.  General appearance: alert and cooperative Neurologic: at baseline per Father Heart: tachy, regular Lungs: clear to auscultation bilaterally Abdomen: tube in place, nontneder minimal serous drainage from CT  Lab Results: Recent Labs    08/25/19 0508 08/26/19 0248  WBC 10.2 9.7  HGB 16.4  16.0 15.7  HCT 48.0  47.0 47.4  PLT 169 159   BMET:  Recent Labs    08/25/19 0508 08/26/19 0248  NA 139  140 140  K 3.9  4.0 4.2  CL 105 105  CO2 22 23  GLUCOSE 139* 123*  BUN 19 16  CREATININE 0.83 0.85  CALCIUM 8.8* 8.9    PT/INR: No results for input(s): LABPROT, INR in the last 72 hours. ABG    Component Value Date/Time   PHART 7.433 08/25/2019 0508   HCO3 25.0 08/25/2019 0508   TCO2 26 08/25/2019 0508   ACIDBASEDEF 0.2 08/24/2019 2020   O2SAT 100.0 08/25/2019 0508   CBG (last 3)  Recent Labs    08/25/19 2338 08/26/19 0337 08/26/19 0712  GLUCAP 118* 122* 148*    Assessment/Plan: S/P  Procedure(s) (LRB): ESOPHAGOGASTRODUODENOSCOPY (EGD) percutaneous gastrostomy tube placement, esophageal stent placement, right chest tube placement (Right) PERCUTANEOUS INSERTION OF GASTROSTOMY TUBE (N/A) CHEST TUBE INSERTION (Right) - NEURO- at baseline Febrile to 101.3 last night, normal WBC, continue Zosyn NPO due to esophageal perforation, on tube feedings advancing to goal of 60 CT with minimal serous drainage- place to water seal, keep until after swallow CBG well controlled  LOS: 2 days    Loreli Slot 08/26/2019

## 2019-08-26 NOTE — Progress Notes (Signed)
      [address]       [place] [address]             [phone]      Sleeping currently  BP (!) 139/96 (BP Location: Left Arm)   Pulse (!) 104   Temp 97.9 F (36.6 C) (Oral)   Resp (!) 23   Ht 6' (1.829 m)   Wt 102.3 kg   SpO2 98%   BMI 30.59 kg/m   Intake/Output Summary (Last 24 hours) at 08/26/2019 1752 Last data filed at 08/26/2019 1600 Gross per 24 hour  Intake 1299.04 ml  Output 345 ml  Net 954.04 ml   Continue current Rx  Govanni Plemons C. Dorris Fetch, MD [facility] [phone]

## 2019-08-27 ENCOUNTER — Inpatient Hospital Stay (HOSPITAL_COMMUNITY): Payer: Medicaid Other

## 2019-08-27 LAB — BASIC METABOLIC PANEL
Anion gap: 8 (ref 5–15)
BUN: 17 mg/dL (ref 6–20)
CO2: 28 mmol/L (ref 22–32)
Calcium: 8.9 mg/dL (ref 8.9–10.3)
Chloride: 105 mmol/L (ref 98–111)
Creatinine, Ser: 0.72 mg/dL (ref 0.61–1.24)
GFR calc Af Amer: >60 mL/min (ref >60)
GFR calc non Af Amer: >60 mL/min (ref >60)
Glucose, Bld: 171 mg/dL — ABNORMAL HIGH (ref 70–99)
Potassium: 4.1 mmol/L (ref 3.5–5.1)
Sodium: 141 mmol/L (ref 135–145)

## 2019-08-27 LAB — CBC
HCT: 43.6 % (ref 39.0–52.0)
Hemoglobin: 14.2 g/dL (ref 13.0–17.0)
MCH: 29.9 pg (ref 26.0–34.0)
MCHC: 32.6 g/dL (ref 30.0–36.0)
MCV: 91.8 fL (ref 80.0–100.0)
Platelets: 144 10*3/uL — ABNORMAL LOW (ref 150–400)
RBC: 4.75 MIL/uL (ref 4.22–5.81)
RDW: 12.6 % (ref 11.5–15.5)
WBC: 8.6 10*3/uL (ref 4.0–10.5)
nRBC: 0 % (ref 0.0–0.2)

## 2019-08-27 LAB — GLUCOSE, CAPILLARY
Glucose-Capillary: 126 mg/dL — ABNORMAL HIGH (ref 70–99)
Glucose-Capillary: 122 mg/dL — ABNORMAL HIGH (ref 70–99)
Glucose-Capillary: 131 mg/dL — ABNORMAL HIGH (ref 70–99)
Glucose-Capillary: 116 mg/dL — ABNORMAL HIGH (ref 70–99)
Glucose-Capillary: 101 mg/dL — ABNORMAL HIGH (ref 70–99)

## 2019-08-27 MED ORDER — SENNOSIDES 8.8 MG/5ML PO SYRP
5.0000 mL | ORAL_SOLUTION | Freq: Every day | ORAL | Status: DC
  Administered 2019-08-27 – 2019-09-04 (×9): 5 mL
  Filled 2019-08-27 (×9): qty 5

## 2019-08-27 MED ORDER — METOPROLOL TARTRATE 25 MG/10 ML ORAL SUSPENSION
12.5000 mg | Freq: Two times a day (BID) | ORAL | Status: DC
  Administered 2019-08-27 (×2): 12.5 mg via ORAL
  Filled 2019-08-27 (×3): qty 5

## 2019-08-27 NOTE — Progress Notes (Signed)
3 Days Post-Op Procedure(s) (LRB): ESOPHAGOGASTRODUODENOSCOPY (EGD) percutaneous gastrostomy tube placement, esophageal stent placement, right chest tube placement (Right) PERCUTANEOUS INSERTION OF GASTROSTOMY TUBE (N/A) CHEST TUBE INSERTION (Right) Subjective: Just back from walking  Objective: Vital signs in last 24 hours: Temp:  [97.9 F (36.6 C)-100 F (37.8 C)] 99 F (37.2 C) (01/24 0740) Pulse Rate:  [86-116] 115 (01/24 0600) Cardiac Rhythm: Sinus tachycardia (01/24 0400) Resp:  [12-23] 21 (01/24 0800) BP: (114-164)/(72-96) 160/89 (01/24 0800) SpO2:  [91 %-99 %] 95 % (01/24 0800) Weight:  [102.2 kg] 102.2 kg (01/24 0500)  Hemodynamic parameters for last 24 hours:    Intake/Output from previous day: 01/23 0701 - 01/24 0700 In: 1962.4 [NG/GT:1420; IV Piggyback:222.4] Out: 475 [Urine:450; Chest Tube:25] Intake/Output this shift: Total I/O In: 72.5 [NG/GT:60; IV Piggyback:12.5] Out: 10 [Chest Tube:10]  General appearance: alert and cooperative Neurologic: no change Heart: tachy, regular Lungs: diminished breath sounds bibasilar Abdomen: binder in place, nontender  Lab Results: Recent Labs    08/26/19 0248 08/27/19 0600  WBC 9.7 8.6  HGB 15.7 14.2  HCT 47.4 43.6  PLT 159 144*   BMET:  Recent Labs    08/26/19 0248 08/27/19 0600  NA 140 141  K 4.2 4.1  CL 105 105  CO2 23 28  GLUCOSE 123* 171*  BUN 16 17  CREATININE 0.85 0.72  CALCIUM 8.9 8.9    PT/INR: No results for input(s): LABPROT, INR in the last 72 hours. ABG    Component Value Date/Time   PHART 7.433 08/25/2019 0508   HCO3 25.0 08/25/2019 0508   TCO2 26 08/25/2019 0508   ACIDBASEDEF 0.2 08/24/2019 2020   O2SAT 100.0 08/25/2019 0508   CBG (last 3)  Recent Labs    08/26/19 1940 08/27/19 0348 08/27/19 0737  GLUCAP 126* 126* 122*    Assessment/Plan: S/P Procedure(s) (LRB): ESOPHAGOGASTRODUODENOSCOPY (EGD) percutaneous gastrostomy tube placement, esophageal stent placement, right  chest tube placement (Right) PERCUTANEOUS INSERTION OF GASTROSTOMY TUBE (N/A) CHEST TUBE INSERTION (Right) -NEURO at baseline  CV- tachycardic and hypertensive- will start low dose metoprolol via tube and also give pain medication  RESP- good sats on 2L [place]  RENAL- normal creatinine, lytes OK  GI- esophageal stent. Tolerating feeds via PEG  Swallow tomorrow  ID- temp trending down, normal WBC on Zosyn and diflucan   LOS: 3 days    Loreli Slot 08/27/2019

## 2019-08-27 NOTE — Progress Notes (Signed)
      [address]       [place], [address]             [phone]      BP (!) 153/86   Pulse (!) 103   Temp 99.6 F (37.6 C) (Oral)   Resp 17   Ht 6' (1.829 m)   Wt 102.2 kg   SpO2 93%   BMI 30.56 kg/m   Intake/Output Summary (Last 24 hours) at 08/27/2019 1723 Last data filed at 08/27/2019 1700 Gross per 24 hour  Intake 2014.25 ml  Output 470 ml  Net 1544.25 ml    No new issues, tolerating TF  Viviann Spare C. Dorris Fetch, MD [facility] [phone]

## 2019-08-28 ENCOUNTER — Inpatient Hospital Stay (HOSPITAL_COMMUNITY): Payer: Medicaid Other

## 2019-08-28 LAB — BASIC METABOLIC PANEL
Anion gap: 13 (ref 5–15)
BUN: 16 mg/dL (ref 6–20)
CO2: 29 mmol/L (ref 22–32)
Calcium: 9.4 mg/dL (ref 8.9–10.3)
Chloride: 104 mmol/L (ref 98–111)
Creatinine, Ser: 0.82 mg/dL (ref 0.61–1.24)
GFR calc Af Amer: >60 mL/min (ref >60)
GFR calc non Af Amer: >60 mL/min (ref >60)
Glucose, Bld: 112 mg/dL — ABNORMAL HIGH (ref 70–99)
Potassium: 4 mmol/L (ref 3.5–5.1)
Sodium: 146 mmol/L — ABNORMAL HIGH (ref 135–145)

## 2019-08-28 LAB — GLUCOSE, CAPILLARY
Glucose-Capillary: 103 mg/dL — ABNORMAL HIGH (ref 70–99)
Glucose-Capillary: 106 mg/dL — ABNORMAL HIGH (ref 70–99)
Glucose-Capillary: 106 mg/dL — ABNORMAL HIGH (ref 70–99)
Glucose-Capillary: 111 mg/dL — ABNORMAL HIGH (ref 70–99)
Glucose-Capillary: 117 mg/dL — ABNORMAL HIGH (ref 70–99)
Glucose-Capillary: 119 mg/dL — ABNORMAL HIGH (ref 70–99)
Glucose-Capillary: 127 mg/dL — ABNORMAL HIGH (ref 70–99)

## 2019-08-28 LAB — CBC
HCT: 46.4 % (ref 39.0–52.0)
Hemoglobin: 15 g/dL (ref 13.0–17.0)
MCH: 30.2 pg (ref 26.0–34.0)
MCHC: 32.3 g/dL (ref 30.0–36.0)
MCV: 93.5 fL (ref 80.0–100.0)
Platelets: 166 10*3/uL (ref 150–400)
RBC: 4.96 MIL/uL (ref 4.22–5.81)
RDW: 12.8 % (ref 11.5–15.5)
WBC: 7.7 10*3/uL (ref 4.0–10.5)
nRBC: 0 % (ref 0.0–0.2)

## 2019-08-28 MED ORDER — METOPROLOL TARTRATE 25 MG/10 ML ORAL SUSPENSION
12.5000 mg | Freq: Two times a day (BID) | ORAL | Status: DC
  Administered 2019-08-28 – 2019-09-04 (×14): 12.5 mg
  Filled 2019-08-28 (×16): qty 5

## 2019-08-28 MED ORDER — ~~LOC~~ CARDIAC SURGERY, PATIENT & FAMILY EDUCATION: Freq: Once | Status: DC

## 2019-08-28 MED ORDER — SODIUM CHLORIDE 0.9% FLUSH
3.0000 mL | Freq: Two times a day (BID) | INTRAVENOUS | Status: DC
  Administered 2019-08-30 – 2019-09-04 (×10): 3 mL via INTRAVENOUS

## 2019-08-28 MED ORDER — SODIUM CHLORIDE 0.9 % IV SOLN: 250.0000 mL | INTRAVENOUS | Status: DC | PRN

## 2019-08-28 MED ORDER — SODIUM CHLORIDE 0.9% FLUSH: 3.0000 mL | INTRAVENOUS | Status: DC | PRN

## 2019-08-28 MED ORDER — HYDROMORPHONE HCL 1 MG/ML IJ SOLN: 1.0000 mg | INTRAMUSCULAR | Status: DC | PRN

## 2019-08-28 NOTE — Progress Notes (Signed)
      [address]       [place] [address]             [phone]                 4 Days Post-Op Procedure(s) (LRB): ESOPHAGOGASTRODUODENOSCOPY (EGD) percutaneous gastrostomy tube placement, esophageal stent placement, right chest tube placement (Right) PERCUTANEOUS INSERTION OF GASTROSTOMY TUBE (N/A) CHEST TUBE INSERTION (Right)   Events: No events _______________________________________________________________ Vitals: BP 132/72   Pulse 75   Temp 98.4 F (36.9 C) (Oral)   Resp 12   Ht 6' (1.829 m)   Wt 107.2 kg   SpO2 100%   BMI 32.05 kg/m   - Neuro: alert, NAD  - Cardiovascular: sinus  .      - Pulm: EWOB, no leak on CT  ABG    Component Value Date/Time   PHART 7.433 08/25/2019 0508   PCO2ART 37.6 08/25/2019 0508   PO2ART 165.0 (H) 08/25/2019 0508   HCO3 25.0 08/25/2019 0508   TCO2 26 08/25/2019 0508   ACIDBASEDEF 0.2 08/24/2019 2020   O2SAT 100.0 08/25/2019 0508    - Abd: soft, peg tube in place - Extremity: trace edema  .Intake/Output      01/24 0701 - 01/25 0700 01/25 0701 - 01/26 0700   P.O. 0    Other 180    NG/GT 989    IV Piggyback 281.5    Total Intake(mL/kg) 1450.5 (13.5)    Urine (mL/kg/hr) 300 (0.1)    Chest Tube 20    Total Output 320    Net +1130.5         Urine Occurrence 3 x       _______________________________________________________________ Labs: CBC Latest Ref Rng & Units 08/28/2019 08/27/2019 08/26/2019  WBC 4.0 - 10.5 K/uL 7.7 8.6 9.7  Hemoglobin 13.0 - 17.0 g/dL 91.4 78.2 95.6  Hematocrit 39.0 - 52.0 % 46.4 43.6 47.4  Platelets 150 - 400 K/uL 166 144(L) 159   CMP Latest Ref Rng & Units 08/28/2019 08/27/2019 08/26/2019  Glucose 70 - 99 mg/dL 213(Y) 865(H) 846(N)  BUN 6 - 20 mg/dL 16 17 16   Creatinine 0.61 - 1.24 mg/dL 6.29 5.28  Sodium 135 - 145 mmol/L 146(H) 141 140  Potassium 3.5 - 5.1 mmol/L 4.0 4.1 4.2  Chloride 98 - 111 mmol/L 104 105 105  CO2 22 - 32 mmol/L 29 28 23   Calcium 8.9 - 10.3  mg/dL 9.4 8.9 8.9  Total Protein 6.5 - 8.1 g/dL - - 6.3(L)  Total Bilirubin 0.3 - 1.2 mg/dL - - 0.8  Alkaline Phos 38 - 126 U/L - - 76  AST 15 - 41 U/L - - 25  ALT 0 - 44 U/L - - 17    CXR: stable  _______________________________________________________________  Assessment and Plan: POD 4 s/p PEG tube, esophageal stent  Neuro: anoxic brain injury. At baseline per father.  CV: HR improved Pulm: continue pulm toilet.  Will keep CT until esophagram Renal: creat stable.   GI: swallow study today.  If good seal will remove CT, allow clears Heme: stable ID: continue zosyn and diflucan Endo: SSI Dispo: will transfer to stepdown  4.13, MD 08/28/2019 8:59 AM

## 2019-08-28 NOTE — Plan of Care (Signed)
  Problem: Activity: Goal: Ability to tolerate increased activity will improve Outcome: Progressing   Problem: Respiratory: Goal: Ability to maintain a clear airway and adequate ventilation will improve Outcome: Progressing   Problem: Role Relationship: Goal: Method of communication will improve Outcome: Progressing   Problem: Education: Goal: Knowledge of General Education information will improve Description: Including pain rating scale, medication(s)/side effects and non-pharmacologic comfort measures Outcome: Progressing   Problem: Health Behavior/Discharge Planning: Goal: Ability to manage health-related needs will improve Outcome: Progressing   Problem: Clinical Measurements: Goal: Ability to maintain clinical measurements within normal limits will improve Outcome: Progressing Goal: Will remain free from infection Outcome: Progressing Goal: Diagnostic test results will improve Outcome: Progressing Goal: Respiratory complications will improve Outcome: Progressing Goal: Cardiovascular complication will be avoided Outcome: Progressing   Problem: Activity: Goal: Risk for activity intolerance will decrease Outcome: Progressing   Problem: Nutrition: Goal: Adequate nutrition will be maintained Outcome: Progressing   Problem: Coping: Goal: Level of anxiety will decrease Outcome: Progressing   Problem: Elimination: Goal: Will not experience complications related to bowel motility Outcome: Progressing Goal: Will not experience complications related to urinary retention Outcome: Progressing   Problem: Pain Managment: Goal: General experience of comfort will improve Outcome: Progressing   Problem: Safety: Goal: Ability to remain free from injury will improve Outcome: Progressing   Problem: Skin Integrity: Goal: Risk for impaired skin integrity will decrease Outcome: Progressing   Problem: Education: Goal: Knowledge of the prescribed therapeutic regimen will  improve Outcome: Progressing   Problem: Bowel/Gastric: Goal: Gastrointestinal status for postoperative course will improve Outcome: Progressing   Problem: Nutritional: Goal: Ability to achieve adequate nutritional intake will improve Outcome: Progressing   Problem: Clinical Measurements: Goal: Postoperative complications will be avoided or minimized Outcome: Progressing   Problem: Respiratory: Goal: Ability to maintain a clear airway will improve Outcome: Progressing   Problem: Skin Integrity: Goal: Demonstration of wound healing without infection will improve Outcome: Progressing   

## 2019-08-28 NOTE — Progress Notes (Signed)
Received order however pt is n/a for CR services. Will not follow. Ethelda Chick CES, ACSM 11:32 AM 08/28/2019

## 2019-08-28 NOTE — Progress Notes (Signed)
Pt with lunch tray to bedside, RN assisted pt to eat and noted coughing every time with think liquids. RN held liquids MD paged for speech consult.

## 2019-08-29 ENCOUNTER — Inpatient Hospital Stay (HOSPITAL_COMMUNITY): Payer: Medicaid Other

## 2019-08-29 LAB — CBC WITH DIFFERENTIAL/PLATELET
Abs Immature Granulocytes: 0.03 10*3/uL (ref 0.00–0.07)
Basophils Absolute: 0 10*3/uL (ref 0.0–0.1)
Basophils Relative: 0 %
Eosinophils Absolute: 0.8 10*3/uL — ABNORMAL HIGH (ref 0.0–0.5)
Eosinophils Relative: 10 %
HCT: 41.6 % (ref 39.0–52.0)
Hemoglobin: 13.6 g/dL (ref 13.0–17.0)
Immature Granulocytes: 0 %
Lymphocytes Relative: 20 %
Lymphs Abs: 1.5 10*3/uL (ref 0.7–4.0)
MCH: 30.1 pg (ref 26.0–34.0)
MCHC: 32.7 g/dL (ref 30.0–36.0)
MCV: 92 fL (ref 80.0–100.0)
Monocytes Absolute: 0.8 10*3/uL (ref 0.1–1.0)
Monocytes Relative: 11 %
Neutro Abs: 4.2 10*3/uL (ref 1.7–7.7)
Neutrophils Relative %: 59 %
Platelets: 178 10*3/uL (ref 150–400)
RBC: 4.52 MIL/uL (ref 4.22–5.81)
RDW: 12.5 % (ref 11.5–15.5)
WBC: 7.4 10*3/uL (ref 4.0–10.5)
nRBC: 0 % (ref 0.0–0.2)

## 2019-08-29 LAB — BASIC METABOLIC PANEL
Anion gap: 9 (ref 5–15)
BUN: 19 mg/dL (ref 6–20)
CO2: 28 mmol/L (ref 22–32)
Calcium: 8.9 mg/dL (ref 8.9–10.3)
Chloride: 106 mmol/L (ref 98–111)
Creatinine, Ser: 0.77 mg/dL (ref 0.61–1.24)
GFR calc Af Amer: >60 mL/min (ref >60)
GFR calc non Af Amer: >60 mL/min (ref >60)
Glucose, Bld: 147 mg/dL — ABNORMAL HIGH (ref 70–99)
Potassium: 3.9 mmol/L (ref 3.5–5.1)
Sodium: 143 mmol/L (ref 135–145)

## 2019-08-29 LAB — GLUCOSE, CAPILLARY
Glucose-Capillary: 143 mg/dL — ABNORMAL HIGH (ref 70–99)
Glucose-Capillary: 122 mg/dL — ABNORMAL HIGH (ref 70–99)
Glucose-Capillary: 154 mg/dL — ABNORMAL HIGH (ref 70–99)
Glucose-Capillary: 128 mg/dL — ABNORMAL HIGH (ref 70–99)
Glucose-Capillary: 112 mg/dL — ABNORMAL HIGH (ref 70–99)

## 2019-08-29 NOTE — Progress Notes (Addendum)
5 Days Post-Op Procedure(s) (LRB): ESOPHAGOGASTRODUODENOSCOPY (EGD) percutaneous gastrostomy tube placement, esophageal stent placement, right chest tube placement (Right) PERCUTANEOUS INSERTION OF GASTROSTOMY TUBE (N/A) CHEST TUBE INSERTION (Right) Subjective: Awake and responsive.  D/W RN caring for him today.  Had some coughing with PO clear liquids at lunch time yesterday but not as much with clears at dinner time last evening.   Objective: Vital signs in last 24 hours: Temp:  [98.1 F (36.7 C)-99.4 F (37.4 C)] 98.8 F (37.1 C) (01/26 0329) Pulse Rate:  [75-108] 105 (01/26 0329) Cardiac Rhythm: Sinus tachycardia (01/26 0336) Resp:  [12-24] 20 (01/26 0539) BP: (122-148)/(72-93) 139/80 (01/26 0329) SpO2:  [91 %-100 %] 99 % (01/26 0329) Weight:  [105.2 kg] 105.2 kg (01/26 0129)     Intake/Output from previous day: 01/25 0701 - 01/26 0700 In: 1174.3 [NG/GT:752; IV Piggyback:302.3] Out: -  Intake/Output this shift: No intake/output data recorded.  General appearance: alert, cooperative and no obvious distress Heart: SR Lungs: breath sounds are clear anterior. Abdomen: soft, non-tender. G-tube exite LUQ, has abdominal binder in place.  Extremities: All warm and well perfused. right arm swollen  Lab Results: Recent Labs    08/28/19 0358 08/29/19 0527  WBC 7.7 7.4  HGB 15.0 13.6  HCT 46.4 41.6  PLT 166 178   BMET:  Recent Labs    08/28/19 0358 08/29/19 0527  NA 146* 143  K 4.0 3.9  CL 104 106  CO2 29 28  GLUCOSE 112* 147*  BUN 16 19  CREATININE 0.82 0.77  CALCIUM 9.4 8.9    PT/INR: No results for input(s): LABPROT, INR in the last 72 hours. ABG    Component Value Date/Time   PHART 7.433 08/25/2019 0508   HCO3 25.0 08/25/2019 0508   TCO2 26 08/25/2019 0508   ACIDBASEDEF 0.2 08/24/2019 2020   O2SAT 100.0 08/25/2019 0508   CBG (last 3)  Recent Labs    08/28/19 2001 08/28/19 2303 08/29/19 0411  GLUCAP 127* 106* 143*    Assessment/Plan: S/P  Procedure(s) (LRB): ESOPHAGOGASTRODUODENOSCOPY (EGD) percutaneous gastrostomy tube placement, esophageal stent placement, right chest tube placement (Right) PERCUTANEOUS INSERTION OF GASTROSTOMY TUBE (N/A) CHEST TUBE INSERTION (Right)  -POD5 EGD with removal of impacted food, esophageal stenting for acute perforation, PEG placement for nutritional support. Esophagram yesterday showed no leak, appropriate positioning of the stent. Chest tube removed. Check PCXR.  Seems to be tolerating clear liquids PO. Possibly advance diet today.  Continue broad--spectrum abx coverage, fluconazole.   -H/O perinatal anoxic brain injury  -Right arm swelling- warm and well perfused. No apparent tenderness. Had BP cuff on this arm this morning, now removed.   Elevate and observe.  -DVT PPX- continue SCD's   LOS: 5 days    Julian Roca, PA-C [phone] 08/29/2019   Awaiting speech eval Day 5 of abx.   dispo planning  Julian Gray

## 2019-08-29 NOTE — Evaluation (Signed)
Clinical/Bedside Swallow Evaluation Patient Details  Name: Julian Gray MRN: [id number] Date of Birth: 22-Feb-1986  Today's Date: 08/29/2019 Time: SLP Start Time (ACUTE ONLY): 1130 SLP Stop Time (ACUTE ONLY): 1155 SLP Time Calculation (min) (ACUTE ONLY): 25 min  Past Medical History:  Past Medical History:  Diagnosis Date  . Epilepsy (HCC)   . Mental retardation   . Personal history of extracorporeal membrane oxygenation (ECMO)    Past Surgical History:  Past Surgical History:  Procedure Laterality Date  . CHEST TUBE INSERTION Right 08/24/2019   Procedure: CHEST TUBE INSERTION;  Surgeon: Corliss Skains, MD;  Location: [facility] OR;  Service: Thoracic;  Laterality: Right;  . ESOPHAGOGASTRODUODENOSCOPY Right 08/24/2019   Procedure: ESOPHAGOGASTRODUODENOSCOPY (EGD) percutaneous gastrostomy tube placement, esophageal stent placement, right chest tube placement;  Surgeon: Corliss Skains, MD;  Location: [facility] OR;  Service: Thoracic;  Laterality: Right;  . REMOVAL OF GASTROSTOMY TUBE N/A 08/24/2019   Procedure: PERCUTANEOUS INSERTION OF GASTROSTOMY TUBE;  Surgeon: Corliss Skains, MD;  Location: [facility] OR;  Service: Thoracic;  Laterality: N/A;   HPI:  34 y.o. male with history of anoxic brain injury presented to [facility] long hospital after several episodes of bloody emesis.  His father states that after eating some apples and potato chips he had retching spell, and about an hour later he threw up bright red blood.  On the way to the hospital he had several episodes of emesis   Assessment / Plan / Recommendation Clinical Impression  Pt seen at bedside for swallow assessment following choking episode on thin liquids. Pt's mother was present, and reports pt has a history of being very impulsive with bolus size and rate, and does not tend to chew well. Pt exhibits adequate oral motor strength and function. No cough response noted with thin or full liquids when bolus size and rate are managed. Pt's  mother fed pt some broth via teaspoon, which resulted in immediate cough response. This may be due to decreased control of teaspoon bolus of quick moving thin liquid. No cough response noted following multiple boluses of thin liquid via cup and straw. Solid textures not given at this time, as MD requested holding trials to clear and full liquids at this time. When cleared by MD, recommend advancing to dys 2 solids (finely chopped) with 1:1 assistance for feeding, due to significant impulsivity and high risk for esophageal issues and aspiration. SLP will follow briefly for education and assessment of tolerance of advanced textures. Pt now has PEG tube for nutritional support.    SLP Visit Diagnosis: Dysphagia, unspecified (R13.10)    Aspiration Risk  Moderate aspiration risk    Diet Recommendation Dysphagia 2 (Fine chop);Thin liquid;Dysphagia 1 (Puree)   Liquid Administration via: Cup;Straw Medication Administration: Whole meds with puree Supervision: Staff to assist with self feeding;Full supervision/cueing for compensatory strategies Compensations: Slow rate;Small sips/bites Postural Changes: Seated upright at 90 degrees;Remain upright for at least 30 minutes after po intake    Other  Recommendations Oral Care Recommendations: Oral care BID   Follow up Recommendations 24 hour supervision/assistance      Frequency and Duration min 1 x/week  1 week;2 weeks       Prognosis Prognosis for Safe Diet Advancement: Good Barriers to Reach Goals: Cognitive deficits;Time post onset      Swallow Study   General Date of Onset: 08/24/19 HPI: 34 y.o. male with history of anoxic brain injury presented to [facility] hospital after several episodes of bloody emesis.  His  father states that after eating some apples and potato chips he had retching spell, and about an hour later he threw up bright red blood.  On the way to the hospital he had several episodes of emesis Type of Study: Bedside Swallow  Evaluation Previous Swallow Assessment: none Diet Prior to this Study: Other (Comment);Thin liquids(clear liquid) Temperature Spikes Noted: No Respiratory Status: Room air History of Recent Intubation: Yes Length of Intubations (days): 1 days Date extubated: 08/25/19 Behavior/Cognition: Alert;Cooperative;Requires cueing(anoxic brain injury at baseline) Oral Cavity Assessment: Within Functional Limits Oral Care Completed by SLP: No Oral Cavity - Dentition: Adequate natural dentition Vision: Functional for self-feeding Self-Feeding Abilities: Able to feed self Patient Positioning: Upright in chair Baseline Vocal Quality: Normal Volitional Cough: Strong Volitional Swallow: Able to elicit    Oral/Motor/Sensory Function Overall Oral Motor/Sensory Function: Within functional limits   Ice Chips Ice chips: Not tested   Thin Liquid Thin Liquid: Impaired Pharyngeal  Phase Impairments: Cough - Immediate Other Comments: Pt is VERY impulsive with bolus size and rate. Cough noted after mother fed broth via teaspoon. No cough via straw with management of bolus size and rate.    Nectar Thick Nectar Thick Liquid: Not tested   Honey Thick Honey Thick Liquid: Not tested   Puree Puree: Within functional limits Presentation: Spoon;Self Fed   Solid     Solid: Not tested     Julian Gray, [facility], [place] Speech Language Pathologist Office: [phone] Pager: [phone]  Shonna Chock 08/29/2019,12:24 PM

## 2019-08-29 NOTE — Plan of Care (Signed)
  Problem: Activity: Goal: Ability to tolerate increased activity will improve Outcome: Progressing   Problem: Respiratory: Goal: Ability to maintain a clear airway and adequate ventilation will improve Outcome: Progressing   Problem: Role Relationship: Goal: Method of communication will improve Outcome: Progressing   Problem: Education: Goal: Knowledge of General Education information will improve Description: Including pain rating scale, medication(s)/side effects and non-pharmacologic comfort measures Outcome: Progressing   Problem: Health Behavior/Discharge Planning: Goal: Ability to manage health-related needs will improve Outcome: Progressing   Problem: Clinical Measurements: Goal: Ability to maintain clinical measurements within normal limits will improve Outcome: Progressing Goal: Will remain free from infection Outcome: Progressing Goal: Diagnostic test results will improve Outcome: Progressing Goal: Respiratory complications will improve Outcome: Progressing Goal: Cardiovascular complication will be avoided Outcome: Progressing   Problem: Activity: Goal: Risk for activity intolerance will decrease Outcome: Progressing   Problem: Nutrition: Goal: Adequate nutrition will be maintained Outcome: Progressing   Problem: Coping: Goal: Level of anxiety will decrease Outcome: Progressing   Problem: Elimination: Goal: Will not experience complications related to bowel motility Outcome: Progressing Goal: Will not experience complications related to urinary retention Outcome: Progressing   Problem: Pain Managment: Goal: General experience of comfort will improve Outcome: Progressing   Problem: Safety: Goal: Ability to remain free from injury will improve Outcome: Progressing   Problem: Skin Integrity: Goal: Risk for impaired skin integrity will decrease Outcome: Progressing   Problem: Education: Goal: Knowledge of the prescribed therapeutic regimen will  improve Outcome: Progressing   Problem: Bowel/Gastric: Goal: Gastrointestinal status for postoperative course will improve Outcome: Progressing   Problem: Nutritional: Goal: Ability to achieve adequate nutritional intake will improve Outcome: Progressing   Problem: Clinical Measurements: Goal: Postoperative complications will be avoided or minimized Outcome: Progressing   Problem: Respiratory: Goal: Ability to maintain a clear airway will improve Outcome: Progressing   Problem: Skin Integrity: Goal: Demonstration of wound healing without infection will improve Outcome: Progressing   

## 2019-08-30 ENCOUNTER — Inpatient Hospital Stay (HOSPITAL_COMMUNITY): Payer: Medicaid Other

## 2019-08-30 DIAGNOSIS — M7989 Other specified soft tissue disorders: Secondary | ICD-10-CM

## 2019-08-30 LAB — GLUCOSE, CAPILLARY
Glucose-Capillary: 110 mg/dL — ABNORMAL HIGH (ref 70–99)
Glucose-Capillary: 127 mg/dL — ABNORMAL HIGH (ref 70–99)
Glucose-Capillary: 132 mg/dL — ABNORMAL HIGH (ref 70–99)
Glucose-Capillary: 140 mg/dL — ABNORMAL HIGH (ref 70–99)
Glucose-Capillary: 145 mg/dL — ABNORMAL HIGH (ref 70–99)
Glucose-Capillary: 151 mg/dL — ABNORMAL HIGH (ref 70–99)

## 2019-08-30 MED ORDER — ENSURE ENLIVE PO LIQD
237.0000 mL | Freq: Two times a day (BID) | ORAL | Status: DC
  Administered 2019-08-30 – 2019-09-02 (×6): 237 mL via ORAL

## 2019-08-30 NOTE — Progress Notes (Signed)
Right upper extremity venous duplex completed. Refer to "CV Proc" under chart review to view preliminary results.  Preliminary results discussed with [place], RN.  08/30/2019 10:13 AM Eula Fried., MHA, RVT, RDCS, RDMS

## 2019-08-30 NOTE — Progress Notes (Addendum)
6 Days Post-Op Procedure(s) (LRB): ESOPHAGOGASTRODUODENOSCOPY (EGD) percutaneous gastrostomy tube placement, esophageal stent placement, right chest tube placement (Right) PERCUTANEOUS INSERTION OF GASTROSTOMY TUBE (N/A) CHEST TUBE INSERTION (Right) Subjective: Awake and cooperative. No apparent distress.  TF infusing at 48ml/hr.  Assessment from speech therapy noted.   Objective: Vital signs in last 24 hours: Temp:  [98.2 F (36.8 C)-99.4 F (37.4 C)] 99 F (37.2 C) (01/27 0725) Pulse Rate:  [93-94] 94 (01/26 1628) Cardiac Rhythm: Sinus tachycardia (01/27 0725) Resp:  [19-28] 20 (01/27 0725) BP: (127-145)/(82-90) 145/85 (01/27 0725) SpO2:  [99 %] 99 % (01/26 1628) Weight:  [104.9 kg] 104.9 kg (01/27 0408)     Intake/Output from previous day: 01/26 0701 - 01/27 0700 In: 600 [P.O.:600] Out: -  Intake/Output this shift: No intake/output data recorded.  Physical Exam: General appearance: alert, cooperative and no obvious distress Heart: SR, ST Lungs: breath sounds are clear anterior. Abdomen: soft, non-tender. G-tube exits LUQ and site is dry.  Has abdominal binder in place.  Extremities: All warm and well perfused. Continues to have right arm swelling and new patch of erythema and induration just above the antecubital fossa.    Lab Results: Recent Labs    08/28/19 0358 08/29/19 0527  WBC 7.7 7.4  HGB 15.0 13.6  HCT 46.4 41.6  PLT 166 178   BMET:  Recent Labs    08/28/19 0358 08/29/19 0527  NA 146* 143  K 4.0 3.9  CL 104 106  CO2 29 28  GLUCOSE 112* 147*  BUN 16 19  CREATININE 0.82 0.77  CALCIUM 9.4 8.9    PT/INR: No results for input(s): LABPROT, INR in the last 72 hours. ABG    Component Value Date/Time   PHART 7.433 08/25/2019 0508   HCO3 25.0 08/25/2019 0508   TCO2 26 08/25/2019 0508   ACIDBASEDEF 0.2 08/24/2019 2020   O2SAT 100.0 08/25/2019 0508   CBG (last 3)  Recent Labs    08/29/19 2008 08/29/19 2351 08/30/19 0343  GLUCAP 112* 110*  132*    Assessment/Plan: S/P Procedure(s) (LRB): ESOPHAGOGASTRODUODENOSCOPY (EGD) percutaneous gastrostomy tube placement, esophageal stent placement, right chest tube placement (Right) PERCUTANEOUS INSERTION OF GASTROSTOMY TUBE (N/A) CHEST TUBE INSERTION (Right)  -POD6 EGD with removal of impacted food, esophageal stenting for acute perforation, PEG placement for nutritional support. Esophagram 08/28/19 showed no leak, appropriate positioning of the stent. Chest tube removed 1/25, F/U CXR stable with no PTX. Noted the assesment from speech pathology regarding swallow function and recommendations regarding feeding. Appropriate diet orders have been placed. Continue broad--spectrum abx coverage, fluconazole.   -H/O perinatal anoxic brain injury  -Right arm swelling- warm and well perfused. Selling about the same as yesterday but has a new patch of erythema and induration just superior to the antecubital fossa. D/W RN, no known IV's in this area and he has had no central lines on the right side. Will check venous duplex scan today. Consider adding Vanc for improved Staph coverage.    -DVT PPX- continue SCD's   LOS: 6 days    Leary Roca, PA-C [phone] 08/30/2019   Agree with above Doing well Tolerated diet 1 more day of abx dispo planning  Gwendolyn Mclees O Shalana Jardin

## 2019-08-30 NOTE — Progress Notes (Signed)
Nutrition Follow-up  RD working remotely.  DOCUMENTATION CODES:   Not applicable  INTERVENTION:   Continue tube feeding via PEG: - Osmolite 1.5 @ 60 ml/hr (1440 ml/day) - Pro-stat 30 ml BID  Tube feeding regimen provides 2360 kcal, 120 grams of protein, and 1097 ml of H2O.   - Ensure Enlive po BID, each supplement provides 350 kcal and 20 grams of protein  NUTRITION DIAGNOSIS:   Inadequate oral intake related to inability to eat as evidenced by NPO status.  Progressing, pt now on full liquid diet  GOAL:   Patient will meet greater than or equal to 90% of their needs  Progressing  MONITOR:   PO intake, Supplement acceptance, Diet advancement, Labs, Weight trends, TF tolerance, Skin  REASON FOR ASSESSMENT:   Rounds    ASSESSMENT:   Patient with PMH significant for MR and epilepsy. Presents this admission with esophageal perforation after choking on an apple.   01/22 - s/p emergent esophageal stent and PEG placement, extubated, TF initiated 01/25 - clear liquids 01/26 - full liquids  Tube feeds infusing via PEG at goal rate. Will continue with tube feeds until pt is transitioned to solid foods and is able to meet his needs via PO intake alone.  RD will order Ensure Enlive.  Weight up 11 lbs since admit. Suspect weight gain related to shifts in fluid.  Meal Completion: 100% x 3 meals (clear or full liquids)  Medications reviewed and include: SSI q 4 hours, senna, IV abx, fluconazole  Labs reviewed. CBG's: 110-154 x 24 hours  Diet Order:   Diet Order            Diet full liquid Room service appropriate? No; Fluid consistency: Thin  Diet effective now              EDUCATION NEEDS:   Not appropriate for education at this time  Skin:  Skin Assessment: Skin Integrity Issues: Incisions: abdomen/chest  Last BM:  08/29/19 large type 1  Height:   Ht Readings from Last 1 Encounters:  08/24/19 6' (1.829 m)    Weight:   Wt Readings from Last 1  Encounters:  08/30/19 104.9 kg    Ideal Body Weight:  80.9 kg  BMI:  Body mass index is 31.36 kg/m.  Estimated Nutritional Needs:   Kcal:  2200-2400 kcal  Protein:  110-125 grams  Fluid:  >/= 2.2 L/day    Earma Reading, MS, RD, LDN Inpatient Clinical Dietitian Pager: [phone] Weekend/After Hours: [phone]

## 2019-08-31 LAB — BASIC METABOLIC PANEL
Anion gap: 7 (ref 5–15)
BUN: 21 mg/dL — ABNORMAL HIGH (ref 6–20)
CO2: 27 mmol/L (ref 22–32)
Calcium: 8.6 mg/dL — ABNORMAL LOW (ref 8.9–10.3)
Chloride: 104 mmol/L (ref 98–111)
Creatinine, Ser: 0.76 mg/dL (ref 0.61–1.24)
GFR calc Af Amer: >60 mL/min (ref >60)
GFR calc non Af Amer: >60 mL/min (ref >60)
Glucose, Bld: 138 mg/dL — ABNORMAL HIGH (ref 70–99)
Potassium: 4 mmol/L (ref 3.5–5.1)
Sodium: 138 mmol/L (ref 135–145)

## 2019-08-31 LAB — CBC
HCT: 40.4 % (ref 39.0–52.0)
Hemoglobin: 13.2 g/dL (ref 13.0–17.0)
MCH: 30.1 pg (ref 26.0–34.0)
MCHC: 32.7 g/dL (ref 30.0–36.0)
MCV: 92 fL (ref 80.0–100.0)
Platelets: 160 10*3/uL (ref 150–400)
RBC: 4.39 MIL/uL (ref 4.22–5.81)
RDW: 12 % (ref 11.5–15.5)
WBC: 8.7 10*3/uL (ref 4.0–10.5)
nRBC: 0 % (ref 0.0–0.2)

## 2019-08-31 LAB — GLUCOSE, CAPILLARY
Glucose-Capillary: 108 mg/dL — ABNORMAL HIGH (ref 70–99)
Glucose-Capillary: 123 mg/dL — ABNORMAL HIGH (ref 70–99)
Glucose-Capillary: 129 mg/dL — ABNORMAL HIGH (ref 70–99)
Glucose-Capillary: 131 mg/dL — ABNORMAL HIGH (ref 70–99)
Glucose-Capillary: 131 mg/dL — ABNORMAL HIGH (ref 70–99)
Glucose-Capillary: 146 mg/dL — ABNORMAL HIGH (ref 70–99)
Glucose-Capillary: 94 mg/dL (ref 70–99)

## 2019-08-31 NOTE — TOC Progression Note (Addendum)
Transition of Care [facility]) - Progression Note    Patient Details  Name: Julian Gray MRN: [id number] Date of Birth: 1985/12/16  Transition of Care [facility]) CM/SW Contact  Leone Haven, RN Phone Number: 08/31/2019, 4:46 PM  Clinical Narrative:    Patient is from home with parents, will need tube feeding supplies and tube feeding pump at dc.  [employer] will supply the tube feeding supplies and the pump.  NCM offered choice to father , he states he does not have a preference. NCM made referral to Tiffany with  [facility], awaiting to hear back. Tiffany with [facility] is unable to take referral, [facility] is unable to take referral and [facility] is unable to take referral.  NCM spoke with father in the room, informed him that have not been able to get a HHRN , explained to him that the [facility] would not have been coming out every day anyway they would just show he and his wife how to do the tube feeds which they can learn here in the hospital before patient goes home.  He states yes he was watching the Staff RN do it.  I explained to him that he needs to be the one or his wife doing the tube feeds so they will be sure to know how to do it before he is discharged.  NCM spoke with Staff RN Darliss Ridgel and informed her of this informationa also.  Mother and father are to be educated and trained on the tube feeds before he is discharged.     Expected Discharge Plan: Home w Home Health Services Barriers to Discharge: Continued Medical Work up  Expected Discharge Plan and Services Expected Discharge Plan: Home w Home Health Services   Discharge Planning Services: CM Consult Post Acute Care Choice: Home Health, Durable Medical Equipment Living arrangements for the past 2 months: Single Family Home                 DME Arranged: Tube feeding, Tube feeding pump DME Agency: [employer] Date DME Agency Contacted: 08/31/19 Time DME Agency Contacted: [phone] Representative spoke with at DME Agency: zach HH Arranged:  RN HH Agency: [facility] Date HH Agency Contacted: 08/31/19 Time HH Agency Contacted: 1645 Representative spoke with at [facility] Agency: [place]   Social Determinants of Health (SDOH) Interventions    Readmission Risk Interventions No flowsheet data found.

## 2019-08-31 NOTE — Progress Notes (Addendum)
7 Days Post-Op Procedure(s) (LRB): ESOPHAGOGASTRODUODENOSCOPY (EGD) percutaneous gastrostomy tube placement, esophageal stent placement, right chest tube placement (Right) PERCUTANEOUS INSERTION OF GASTROSTOMY TUBE (N/A) CHEST TUBE INSERTION (Right) Subjective: Sleeping but awakens easily and is calm and cooperative.   Objective: Vital signs in last 24 hours: Temp:  [97.6 F (36.4 C)-99.7 F (37.6 C)] 98.3 F (36.8 C) (01/28 0257) Pulse Rate:  [103-109] 104 (01/27 2052) Cardiac Rhythm: Normal sinus rhythm (01/28 0333) Resp:  [20-28] 21 (01/27 1934) BP: (121-153)/(58-99) 121/69 (01/28 0257) SpO2:  [99 %] 99 % (01/27 1934) Weight:  [106.2 kg] 106.2 kg (01/28 0257)     Intake/Output from previous day: 01/27 0701 - 01/28 0700 In: 504.7 [I.V.:6; IV Piggyback:498.7] Out: 275 [Urine:275] Intake/Output this shift: No intake/output data recorded.  Physical Exam: General appearance:alert, cooperative andno obvious distress Heart:SR Lungs:breath sounds are coarse anterior. Abdomen:soft, non-tender. G-tube exits LUQ and site is dry.  Has abdominal binder in place. Extremities:All warm and well perfused.Right arm swelling erythema are resolving.   Lab Results: Recent Labs    08/29/19 0527 08/31/19 0254  WBC 7.4 8.7  HGB 13.6 13.2  HCT 41.6 40.4  PLT 178 160   BMET:  Recent Labs    08/29/19 0527 08/31/19 0254  NA 143 138  K 3.9 4.0  CL 106 104  CO2 28 27  GLUCOSE 147* 138*  BUN 19 21*  CREATININE 0.77 0.76  CALCIUM 8.9 8.6*    PT/INR: No results for input(s): LABPROT, INR in the last 72 hours. ABG    Component Value Date/Time   PHART 7.433 08/25/2019 0508   HCO3 25.0 08/25/2019 0508   TCO2 26 08/25/2019 0508   ACIDBASEDEF 0.2 08/24/2019 2020   O2SAT 100.0 08/25/2019 0508   CBG (last 3)  Recent Labs    08/30/19 2002 08/30/19 2341 08/31/19 0358  GLUCAP 151* 94 129*    Assessment/Plan: S/P Procedure(s) (LRB): ESOPHAGOGASTRODUODENOSCOPY (EGD)  percutaneous gastrostomy tube placement, esophageal stent placement, right chest tube placement (Right) PERCUTANEOUS INSERTION OF GASTROSTOMY TUBE (N/A) CHEST TUBE INSERTION (Right)  -POD7 EGD with removal of impacted food, esophageal stenting for acute perforation, PEG placement for nutritional support. Esophagram 1/25/21showed no leak, appropriate positioning of the stent. Chest tube removed 1/25, F/U CXR stable with no PTX. Continue full liquid diet with close supervision as per speech therapy rec's.  He remains afebrile and and has normal WBC.   Continue broad--spectrum abx coverage and fluconazole for another 24 hours to complete 7-day course.   -H/O perinatal anoxic brain injury  -Right arm swelling- remainswarm and well perfused. Swelling and erythema are resolving. Venous duplex showed thrombosis of cephalic vein but no DVT. Continue local warm compresses for another day.   -DVT PPX- continue SCD's  -Tentatively plan for discharge tomorrow.   LOS: 7 days    Leary Roca, [place] [phone] 08/31/2019   Agree with above Will stop abx today Will check CBC tomorrow, if stable ok for discharge dispo planning  Jerimey Burridge O Janayia Burggraf

## 2019-08-31 NOTE — Plan of Care (Signed)

## 2019-08-31 NOTE — Plan of Care (Signed)

## 2019-08-31 NOTE — TOC Progression Note (Signed)
Transition of Care [facility]) - Progression Note    Patient Details  Name: Julian Gray MRN: [id number] Date of Birth: 1986-07-25  Transition of Care [facility]) CM/SW Contact  Leone Haven, RN Phone Number: 08/31/2019, 4:06 PM  Clinical Narrative:    NCM contacted Zach with [employer] for tube feeding supplies and tube feeding pump.  Plan for dc tomorrow, NCM tried to reach mom in the room, she had left, will try to contact her later to set up [facility] for tube feeding.          Expected Discharge Plan and Services                                                 Social Determinants of Health (SDOH) Interventions    Readmission Risk Interventions No flowsheet data found.

## 2019-09-01 ENCOUNTER — Inpatient Hospital Stay (HOSPITAL_COMMUNITY): Payer: Medicaid Other

## 2019-09-01 LAB — URINALYSIS, COMPLETE (UACMP) WITH MICROSCOPIC
Bilirubin Urine: NEGATIVE
Glucose, UA: NEGATIVE mg/dL
Hgb urine dipstick: NEGATIVE
Ketones, ur: NEGATIVE mg/dL
Leukocytes,Ua: NEGATIVE
Nitrite: NEGATIVE
Protein, ur: NEGATIVE mg/dL
Specific Gravity, Urine: 1.024 (ref 1.005–1.030)
pH: 8 (ref 5.0–8.0)

## 2019-09-01 LAB — CBC
HCT: 38.1 % — ABNORMAL LOW (ref 39.0–52.0)
Hemoglobin: 12.5 g/dL — ABNORMAL LOW (ref 13.0–17.0)
MCH: 29.9 pg (ref 26.0–34.0)
MCHC: 32.8 g/dL (ref 30.0–36.0)
MCV: 91.1 fL (ref 80.0–100.0)
Platelets: 163 10*3/uL (ref 150–400)
RBC: 4.18 MIL/uL — ABNORMAL LOW (ref 4.22–5.81)
RDW: 12 % (ref 11.5–15.5)
WBC: 7 10*3/uL (ref 4.0–10.5)
nRBC: 0 % (ref 0.0–0.2)

## 2019-09-01 LAB — GLUCOSE, CAPILLARY
Glucose-Capillary: 105 mg/dL — ABNORMAL HIGH (ref 70–99)
Glucose-Capillary: 112 mg/dL — ABNORMAL HIGH (ref 70–99)
Glucose-Capillary: 116 mg/dL — ABNORMAL HIGH (ref 70–99)
Glucose-Capillary: 118 mg/dL — ABNORMAL HIGH (ref 70–99)
Glucose-Capillary: 143 mg/dL — ABNORMAL HIGH (ref 70–99)
Glucose-Capillary: 144 mg/dL — ABNORMAL HIGH (ref 70–99)

## 2019-09-01 NOTE — Progress Notes (Addendum)
  Speech Language Pathology Treatment: Dysphagia  Patient Details Name: Julian Gray MRN: [id number] DOB: Jun 26, 1986 Today's Date: 09/01/2019 Time: 5331-7409 SLP Time Calculation (min) (ACUTE ONLY): 15 min  Assessment / Plan / Recommendation Clinical Impression  Pt seen at bedside for assessment of diet tolerance and education with family. Pt's father was present today, and reports DC is anticipated today. Pt was observed with thin liquids, and does not exhibit overt s/s aspiration if bolus size and rate are limited. Pt continues to be significantly impulsive with po intake if close supervision and assistance is given. Father was receptive to education regarding the importance of providing small bites/sips at a slow rate. Once solid foods are ok per surgery, recommend advancing to dys 2 (finely chopped) solids and thin liquids. Father was encouraged to take the safe swallow precaution sheet at DC. No further ST intervention recommended at this time.    HPI HPI: 34 y.o. male with history of anoxic brain injury presented to [facility] long hospital after several episodes of bloody emesis.  His father states that after eating some apples and potato chips he had retching spell, and about an hour later he threw up bright red blood.  On the way to the hospital he had several episodes of emesis      SLP Plan  Discharge SLP treatment due to All goals met       Recommendations  Diet recommendations: Dysphagia 2 (fine chop);Dysphagia 1 (puree);Thin liquid Liquids provided via: Cup;Straw Medication Administration: Whole meds with puree Supervision: Patient able to self feed;Full supervision/cueing for compensatory strategies Compensations: Slow rate;Small sips/bites Postural Changes and/or Swallow Maneuvers: Seated upright 90 degrees;Upright 30-60 min after meal                Oral Care Recommendations: Oral care BID Follow up Recommendations: 24 hour supervision/assistance SLP Visit Diagnosis:  Dysphagia, unspecified (R13.10) Plan: Discharge SLP treatment due to (comment);All goals met       GO              Camrie Stock B. Quentin Ore, [facility], [place] Speech Language Pathologist Office: [phone] Pager: [phone]   Shonna Chock 09/01/2019, 12:29 PM

## 2019-09-01 NOTE — Progress Notes (Signed)
Upon vital check RN notified by tech patient temp 100.7, vitals rechecked in 30 mins 100.2 RN, rechecked again at approximately 1245 temp 100.0.   RN paged Joette Catching, [place] to advise of patient running mild fever. Page returned by staff in [place]. Message being relayed. Awaiting orders. RN will continue to monitor.

## 2019-09-01 NOTE — Progress Notes (Signed)
RN educated mother with teach back on how to provide medication through peg tube. Mother was able to flush before and after medication administration. RN instructed mother to stop feeding pump before attempting to administer flush or medication through tube. Further education for pump use may be needed. Father not at bedside however demonstration of medication and flushing was shown to father during morning medication administration as well as on 08/31/19. Per mother pt father will be at bedside tomorrow morning will need to demonstrate the ability to use pump and provide medication through tube.

## 2019-09-01 NOTE — Progress Notes (Addendum)
8 Days Post-Op Procedure(s) (LRB): ESOPHAGOGASTRODUODENOSCOPY (EGD) percutaneous gastrostomy tube placement, esophageal stent placement, right chest tube placement (Right) PERCUTANEOUS INSERTION OF GASTROSTOMY TUBE (N/A) CHEST TUBE INSERTION (Right) Subjective: Awake and responsive.  Tolerating soft diet per RN.   Objective: Vital signs in last 24 hours: Temp:  [98.4 F (36.9 C)-99.6 F (37.6 C)] 99.3 F (37.4 C) (01/29 0739) Pulse Rate:  [94-114] 108 (01/29 0739) Cardiac Rhythm: Normal sinus rhythm (01/29 0320) Resp:  [17-26] 20 (01/29 0739) BP: (116-149)/(59-83) 149/80 (01/29 0739) SpO2:  [97 %-98 %] 97 % (01/29 0339) Weight:  [104.6 kg] 104.6 kg (01/29 0558)     Intake/Output from previous day: 01/28 0701 - 01/29 0700 In: 720 [P.O.:660] Out: 1775 [Urine:1775] Intake/Output this shift: No intake/output data recorded.  Physical Exam: General appearance:alert, cooperative andno obvious distress Heart:SR Lungs:breath sounds are clear Abdomen:soft, non-tender. G-tube exitsLUQ and site is dry. Has abdominal binder in place. Extremities:All warm and well perfused.Right arm swelling has resolved. Has new IV in right upper arm.   Lab Results: Recent Labs    08/31/19 0254 09/01/19 0243  WBC 8.7 7.0  HGB 13.2 12.5*  HCT 40.4 38.1*  PLT 160 163   BMET:  Recent Labs    08/31/19 0254  NA 138  K 4.0  CL 104  CO2 27  GLUCOSE 138*  BUN 21*  CREATININE 0.76  CALCIUM 8.6*    PT/INR: No results for input(s): LABPROT, INR in the last 72 hours. ABG    Component Value Date/Time   PHART 7.433 08/25/2019 0508   HCO3 25.0 08/25/2019 0508   TCO2 26 08/25/2019 0508   ACIDBASEDEF 0.2 08/24/2019 2020   O2SAT 100.0 08/25/2019 0508   CBG (last 3)  Recent Labs    08/31/19 2314 09/01/19 0337 09/01/19 0743  GLUCAP 131* 105* 143*    Assessment/Plan: S/P Procedure(s) (LRB): ESOPHAGOGASTRODUODENOSCOPY (EGD) percutaneous gastrostomy tube placement, esophageal  stent placement, right chest tube placement (Right) PERCUTANEOUS INSERTION OF GASTROSTOMY TUBE (N/A) CHEST TUBE INSERTION (Right)  -POD8EGD with removal of impacted food, esophageal stenting for acute perforation, PEG placement for nutritional support. Esophagram1/25/21showed no leak, appropriate positioning of the stent. Chest tube removed1/25, F/U CXR stable with no PTX. He remains afebrile and and has normal WBC.  7-day course of  broad--spectrum abx  and fluconazole now complete.    -H/O perinatal anoxic brain injury  -Right arm swelling- Resolved.  Venous duplex showed thrombosis of cephalic vein but no DVT.   -DVT PPX- continue SCD's  -Plan for discharge later today with home health, nocturnal TF, mechanical soft diet.    LOS: 8 days    Leary Roca, [place]elia Poche [phone] 09/01/2019   Addendum: Mr. Goren developed low-grade fever this afternoon.  Will delay discharge. Check CXR today and repeat CBC in AM.  Gaynelle Arabian, PA-C

## 2019-09-01 NOTE — Plan of Care (Signed)
  Problem: Activity: Goal: Ability to tolerate increased activity will improve Outcome: Progressing   Problem: Respiratory: Goal: Ability to maintain a clear airway and adequate ventilation will improve Outcome: Progressing   Problem: Role Relationship: Goal: Method of communication will improve Outcome: Progressing   Problem: Education: Goal: Knowledge of General Education information will improve Description: Including pain rating scale, medication(s)/side effects and non-pharmacologic comfort measures Outcome: Progressing   Problem: Health Behavior/Discharge Planning: Goal: Ability to manage health-related needs will improve Outcome: Progressing   Problem: Clinical Measurements: Goal: Ability to maintain clinical measurements within normal limits will improve Outcome: Progressing Goal: Will remain free from infection Outcome: Progressing Goal: Diagnostic test results will improve Outcome: Progressing Goal: Respiratory complications will improve Outcome: Progressing Goal: Cardiovascular complication will be avoided Outcome: Progressing   Problem: Activity: Goal: Risk for activity intolerance will decrease Outcome: Progressing   Problem: Nutrition: Goal: Adequate nutrition will be maintained Outcome: Progressing   Problem: Coping: Goal: Level of anxiety will decrease Outcome: Progressing   Problem: Elimination: Goal: Will not experience complications related to bowel motility Outcome: Progressing Goal: Will not experience complications related to urinary retention Outcome: Progressing   Problem: Pain Managment: Goal: General experience of comfort will improve Outcome: Progressing   Problem: Safety: Goal: Ability to remain free from injury will improve Outcome: Progressing   Problem: Skin Integrity: Goal: Risk for impaired skin integrity will decrease Outcome: Progressing   Problem: Education: Goal: Knowledge of the prescribed therapeutic regimen will  improve Outcome: Progressing   Problem: Bowel/Gastric: Goal: Gastrointestinal status for postoperative course will improve Outcome: Progressing   Problem: Nutritional: Goal: Ability to achieve adequate nutritional intake will improve Outcome: Progressing   Problem: Clinical Measurements: Goal: Postoperative complications will be avoided or minimized Outcome: Progressing   Problem: Respiratory: Goal: Ability to maintain a clear airway will improve Outcome: Progressing   Problem: Skin Integrity: Goal: Demonstration of wound healing without infection will improve Outcome: Progressing   

## 2019-09-02 LAB — BASIC METABOLIC PANEL
Anion gap: 7 (ref 5–15)
BUN: 20 mg/dL (ref 6–20)
CO2: 27 mmol/L (ref 22–32)
Calcium: 8.4 mg/dL — ABNORMAL LOW (ref 8.9–10.3)
Chloride: 103 mmol/L (ref 98–111)
Creatinine, Ser: 0.66 mg/dL (ref 0.61–1.24)
GFR calc Af Amer: >60 mL/min (ref >60)
GFR calc non Af Amer: >60 mL/min (ref >60)
Glucose, Bld: 122 mg/dL — ABNORMAL HIGH (ref 70–99)
Potassium: 4.1 mmol/L (ref 3.5–5.1)
Sodium: 137 mmol/L (ref 135–145)

## 2019-09-02 LAB — CBC
HCT: 37 % — ABNORMAL LOW (ref 39.0–52.0)
Hemoglobin: 12.1 g/dL — ABNORMAL LOW (ref 13.0–17.0)
MCH: 30 pg (ref 26.0–34.0)
MCHC: 32.7 g/dL (ref 30.0–36.0)
MCV: 91.8 fL (ref 80.0–100.0)
Platelets: 176 10*3/uL (ref 150–400)
RBC: 4.03 MIL/uL — ABNORMAL LOW (ref 4.22–5.81)
RDW: 12.2 % (ref 11.5–15.5)
WBC: 7.4 10*3/uL (ref 4.0–10.5)
nRBC: 0 % (ref 0.0–0.2)

## 2019-09-02 LAB — GLUCOSE, CAPILLARY
Glucose-Capillary: 131 mg/dL — ABNORMAL HIGH (ref 70–99)
Glucose-Capillary: 122 mg/dL — ABNORMAL HIGH (ref 70–99)
Glucose-Capillary: 155 mg/dL — ABNORMAL HIGH (ref 70–99)
Glucose-Capillary: 117 mg/dL — ABNORMAL HIGH (ref 70–99)
Glucose-Capillary: 96 mg/dL (ref 70–99)
Glucose-Capillary: 116 mg/dL — ABNORMAL HIGH (ref 70–99)

## 2019-09-02 MED ORDER — AMOXICILLIN-POT CLAVULANATE 875-125 MG PO TABS
1.0000 | ORAL_TABLET | Freq: Two times a day (BID) | ORAL | Status: DC
  Administered 2019-09-02 – 2019-09-04 (×5): 1
  Filled 2019-09-02 (×5): qty 1

## 2019-09-02 NOTE — Progress Notes (Signed)
      [address]       [place] [address]             [phone]      9 Days Post-Op Procedure(s) (LRB): ESOPHAGOGASTRODUODENOSCOPY (EGD) percutaneous gastrostomy tube placement, esophageal stent placement, right chest tube placement (Right) PERCUTANEOUS INSERTION OF GASTROSTOMY TUBE (N/A) CHEST TUBE INSERTION (Right)   Subjective:  Up in bed eating breakfast with assistance of nursing tech.  Parents not a bedside, per nurse usually arrive around 10  Objective: Vital signs in last 24 hours: Temp:  [99.4 F (37.4 C)-100.8 F (38.2 C)] 100.1 F (37.8 C) (01/30 0800) Pulse Rate:  [85-110] 100 (01/30 0800) Cardiac Rhythm: Sinus tachycardia (01/30 0800) Resp:  [16-23] 20 (01/30 0800) BP: (127-146)/(77-85) 135/77 (01/30 0800) SpO2:  [97 %-98 %] 97 % (01/30 0800) Weight:  [104.8 kg] 104.8 kg (01/30 0336)  Intake/Output from previous day: 01/29 0701 - 01/30 0700 In: 430 [P.O.:380] Out: 1000 [Urine:1000]  General appearance: alert, cooperative and no distress Heart: regular rate and rhythm Lungs: clear to auscultation bilaterally Abdomen: soft, non-tender; bowel sounds normal; no masses,  no organomegaly Extremities: extremities normal, atraumatic, no cyanosis or edema  Lab Results: Recent Labs    09/01/19 0243 09/02/19 0225  WBC 7.0 7.4  HGB 12.5* 12.1*  HCT 38.1* 37.0*  PLT 163 176   BMET:  Recent Labs    08/31/19 0254 09/02/19 0225  NA 138 137  K 4.0 4.1  CL 104 103  CO2 27 27  GLUCOSE 138* 122*  BUN 21* 20  CREATININE 0.76 0.66  CALCIUM 8.6* 8.4*    PT/INR: No results for input(s): LABPROT, INR in the last 72 hours. ABG    Component Value Date/Time   PHART 7.433 08/25/2019 0508   HCO3 25.0 08/25/2019 0508   TCO2 26 08/25/2019 0508   ACIDBASEDEF 0.2 08/24/2019 2020   O2SAT 100.0 08/25/2019 0508   CBG (last 3)  Recent Labs    09/01/19 2347 09/02/19 0334 09/02/19 0821  GLUCAP 116* 131* 122*    Assessment/Plan: S/P  Procedure(s) (LRB): ESOPHAGOGASTRODUODENOSCOPY (EGD) percutaneous gastrostomy tube placement, esophageal stent placement, right chest tube placement (Right) PERCUTANEOUS INSERTION OF GASTROSTOMY TUBE (N/A) CHEST TUBE INSERTION (Right)  1. S/P EGD for esophageal perforation due to emesis, stent in place, patient tolerating soft diet, tube feedings 2. ID- patient with low grade temp yesterday afternoon, has persisted overnight, no leukocytosis on CBC this morning, CXR suggestive of possible pneumonia due to aspiration, will discuss if patient needs ABX with Dr. Cliffton Asters he completed a 7 day course for initial condition 3. GI- on tube feedings nightly, will continue at discharge 4. Dispo- patient stable, low grade fever has persisted overnight, no leukocytosis present, ? Possible aspiration pneumonia on CXR, will discuss management with Dr. Cliffton Asters, continue current care   LOS: 9 days    Lowella Dandy, PA-C  09/02/2019

## 2019-09-02 NOTE — Progress Notes (Signed)
Both Mother and Father are allowed to visit their non-verbal child.

## 2019-09-02 NOTE — Progress Notes (Signed)
      [address]       [place] [address]             [phone]      9 Days Post-Op Procedure(s) (LRB): ESOPHAGOGASTRODUODENOSCOPY (EGD) percutaneous gastrostomy tube placement, esophageal stent placement, right chest tube placement (Right) PERCUTANEOUS INSERTION OF GASTROSTOMY TUBE (N/A) CHEST TUBE INSERTION (Right)   Subjective:  Up in bed eating breakfast with assistance of nursing tech.  Parents not a bedside, per nurse usually arrive around 10  Objective: Vital signs in last 24 hours: Temp:  [99.4 F (37.4 C)-101 F (38.3 C)] 101 F (38.3 C) (01/30 1115) Pulse Rate:  [85-100] 95 (01/30 1115) Cardiac Rhythm: Sinus tachycardia (01/30 0800) Resp:  [16-24] 24 (01/30 1115) BP: (110-146)/(63-83) 110/63 (01/30 1115) SpO2:  [97 %-98 %] 97 % (01/30 0800) Weight:  [104.8 kg] 104.8 kg (01/30 0336)  Intake/Output from previous day: 01/29 0701 - 01/30 0700 In: 430 [P.O.:380] Out: 1000 [Urine:1000]  General appearance: alert, cooperative and no distress Heart: regular rate and rhythm Lungs: clear to auscultation bilaterally Abdomen: soft, non-tender; bowel sounds normal; no masses,  no organomegaly Extremities: extremities normal, atraumatic, no cyanosis or edema  Lab Results: Recent Labs    09/01/19 0243 09/02/19 0225  WBC 7.0 7.4  HGB 12.5* 12.1*  HCT 38.1* 37.0*  PLT 163 176   BMET:  Recent Labs    08/31/19 0254 09/02/19 0225  NA 138 137  K 4.0 4.1  CL 104 103  CO2 27 27  GLUCOSE 138* 122*  BUN 21* 20  CREATININE 0.76 0.66  CALCIUM 8.6* 8.4*    PT/INR: No results for input(s): LABPROT, INR in the last 72 hours. ABG    Component Value Date/Time   PHART 7.433 08/25/2019 0508   HCO3 25.0 08/25/2019 0508   TCO2 26 08/25/2019 0508   ACIDBASEDEF 0.2 08/24/2019 2020   O2SAT 100.0 08/25/2019 0508   CBG (last 3)  Recent Labs    09/02/19 0334 09/02/19 0821 09/02/19 1112  GLUCAP 131* 122* 155*    Assessment/Plan: S/P  Procedure(s) (LRB): ESOPHAGOGASTRODUODENOSCOPY (EGD) percutaneous gastrostomy tube placement, esophageal stent placement, right chest tube placement (Right) PERCUTANEOUS INSERTION OF GASTROSTOMY TUBE (N/A) CHEST TUBE INSERTION (Right)  1. S/P EGD for esophageal perforation due to emesis, stent in place, patient tolerating soft diet, tube feedings 2. ID- patient with low grade temp yesterday afternoon, has persisted overnight, no leukocytosis on CBC this morning, CXR suggestive of possible pneumonia due to aspiration, will discuss if patient needs ABX with Dr. Cliffton Asters he completed a 7 day course for initial condition 3. GI- on tube feedings nightly, will continue at discharge 4. Dispo- patient stable, low grade fever has persisted overnight, no leukocytosis present, ? Possible aspiration pneumonia on CXR, will discuss management with Dr. Cliffton Asters, continue current care   LOS: 9 days    Julian Skains, MD  09/02/2019   Agree with above Febrile again, infiltrates on CXR, likely aspiration pneumonia Will start augmentin Will make strict npo  Julian Gray

## 2019-09-02 NOTE — Discharge Summary (Signed)
Physician Discharge Summary  Patient ID: Julian Gray MRN: [id number] DOB/AGE: [birth year] 34 y.o.  Admit date: 08/24/2019 Discharge date: 09/04/2019  Admission Diagnoses:  Patient Active Problem List   Diagnosis Date Noted  . 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 Pneumomediastinum (HCC) 08/24/2019  . 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  He is non-verbal.  He was brought to the ED on 08/24/2019 by his father due to several episodes of bloody emesis.  The father states his son had been eating some apples and potato chips when he developed a retching spell.  About an hour later the patient vomited up bright red blood.  On the way to the hospital the patient suffered several more episodes of bloody emesis.  CT scan was obtained and was concerned for possible esophageal perforation.  CT surgery consult was requested.  The patient was evaluated by Dr. Cliffton Asters who felt emergent surgical intervention would be indicated.  The risks and benefits of the procedure were explained to the patient's father who consented to proceed with surgical procedure.    Hospital Course:   Julian Gray was taken to the operating room and underwent Esophagogastroscopy which revealed impacted food in the esophagus which was removed, Placement of a percutaneous gastrostomy tube, placement of a partially covered esophageal stent, Bronchoscopy, and placement of a right sided chest tube.  The patient tolerated the procedure and was taken to the SICU in stable condition.  The patient was weaned and his breathing tube was removed without difficulty on 08/25/2019.  He was treated with Diflucan and zosyn for 7 days.  He was started slowly on tube  feedings.  He was hypertensive.  He was weaned off Nicardipine drip as able and restarted on his home antihypertensive medications.  He underwent barium esophogram on 08/28/2019.  This showed no evidence of further leak.  The esophageal stent was felt to be in good position.  He was started on a clear liquid diet.  He did experiencing some coughing initially with intake.  He underwent SLP evaluation which showed a moderate risk of aspiration.  They felt patient could tolerate a dysphagia 2 diet.  He developed a right upper arm swelling and erythema.  He underwent duplex study which showed a thrombosed cephalic vein, but no evidence of DVT.  He was treated with warm compress and swelling resolved. The patient continued to improve.  His tube feedings were at a goal rate of 60 ml per hour.  Unfortunately he developed a fever.  His white count was within normal limits.  Urinalysis was negative for infection.  Blood cultures were also obtained and were negative.  Finally CXR was obtained and showed evidence of aspiration pneumonia.  The patient was made NPO.  He was started on Augmentin for this.  He remained afebrile.  He was monitored for an additional 48 hours.  He remains clinically stable and is medically stable for discharge home today.    Consults: SLP  Significant Diagnostic Studies: Esophogram  1. Distal migration of the proximal end of the esophageal stent compared with the original post stent placement radiographs, likely related to stent expansion. The esophageal lumen is not excluded by the stent proximally. 2. No evidence of residual esophageal leak.  Treatments: surgery:   Procedure: -Esophagogastroscopy -Placement of a 24 French percutaneous gastrostomy tube -Placement of a 28 x 125 mm partially covered esophageal stent -Bronchoscopy -Placement of a 28 French Argyle chest tube  Discharge Exam: Blood pressure 110/64, pulse (!) 104, temperature 98 F (36.7 C), temperature source Oral,  resp. rate 20, height 6' (1.829 m), weight 103.6 kg, SpO2 98 %.  General appearance: alert, cooperative and no distress Heart: regular rate and rhythm Lungs: clear to auscultation bilaterally Abdomen: soft, non-tender; bowel sounds normal; no masses,  no organomegaly Extremities: extremities normal, atraumatic, no cyanosis or edema Wound: Feeding tube site, clean  Disposition: Home  Discharge Medications:   Allergies as of 09/04/2019   No Known Allergies     Medication List    STOP taking these medications   FISH OIL + D3 PO   multivitamin tablet   naproxen sodium 220 MG tablet Commonly known as: ALEVE   oxcarbazepine 600 MG tablet Commonly known as: TRILEPTAL Replaced by: OXcarbazepine 300 MG/5ML suspension     TAKE these medications   acetaminophen 160 MG/5ML solution Commonly known as: TYLENOL Place 20.3 mLs (650 mg total) into feeding tube every 6 (six) hours as needed for mild pain or fever.   amoxicillin-clavulanate 875-125 MG tablet Commonly known as: AUGMENTIN Place 1 tablet into feeding tube every 12 (twelve) hours for 7 days.   clonazePAM 1 MG tablet Commonly known as: KLONOPIN Place 1 tablet (1 mg total) into feeding tube at bedtime as needed (sleep). What changed:   how to take this  reasons to take this   clozapine 50 MG tablet Commonly known as: CLOZARIL Place 1 tablet (50 mg total) into feeding tube 3 (three) times daily. What changed: how to take this   feeding supplement (OSMOLITE 1.5 CAL) Liqd Place 1,000 mLs into feeding tube continuous.   feeding supplement (PRO-STAT SUGAR FREE 64) Liqd Place 30 mLs into feeding tube 2 (two) times daily.   metoprolol tartrate 25 mg/10 mL Susp Commonly known as: LOPRESSOR Place 5 mLs (12.5 mg total) into feeding tube 2 (two) times daily.   OLANZapine 15 MG tablet Commonly known as: ZYPREXA Place 1 tablet (15 mg total) into feeding tube at bedtime. What changed: how to take this   OXcarbazepine  300 MG/5ML suspension Commonly known as: TRILEPTAL Place 10 mLs (600 mg total) into feeding tube 2 (two) times daily. Replaces: oxcarbazepine 600 MG tablet            Durable Medical Equipment  (From admission, onward)         Start     Ordered   09/01/19 1411  For home use only DME Bedside commode  Once    Question:  Patient needs a bedside commode to treat with the following condition  Answer:  Weakness   09/01/19 1410   08/31/19 1558  For home use only DME Tube feeding pump  Once    Question:  Length of Need  Answer:  Lifetime   08/31/19 1558   08/31/19 1557  For home use only DME Tube feeding  Once    Comments: Continue tube feeding via PEG: - Osmolite 1.5 @ 60 ml/hr (1440 ml/day) - Pro-stat 30 ml BID  Tube feeding regimen provides 2360 kcal, 120 grams of protein, and 1097 ml of H2O   08/31/19 1557         Follow-up Information    [facility] Follow up.   Specialty: Home Health Services Why: for  home health services, they will contact you to set up your first home visit. Contact information: [address] [place] [address] [phone]        Lajuana Matte, MD Follow up on 09/08/2019.   Specialty: Cardiothoracic Surgery Why: Appointment is at 11:15 Contact information: [place] [address] [phone]        [facility] Follow up.   Why: tube feeding and supplies  Contact information: [place] [address] [phone]           Signed:  Ellwood Handler PA-C 09/04/2019, 7:25 AM

## 2019-09-02 NOTE — Plan of Care (Signed)
  Problem: Activity: Goal: Ability to tolerate increased activity will improve Outcome: Progressing   Problem: Respiratory: Goal: Ability to maintain a clear airway and adequate ventilation will improve Outcome: Progressing   Problem: Role Relationship: Goal: Method of communication will improve Outcome: Progressing   Problem: Education: Goal: Knowledge of General Education information will improve Description: Including pain rating scale, medication(s)/side effects and non-pharmacologic comfort measures Outcome: Progressing   Problem: Health Behavior/Discharge Planning: Goal: Ability to manage health-related needs will improve Outcome: Progressing   Problem: Clinical Measurements: Goal: Ability to maintain clinical measurements within normal limits will improve Outcome: Progressing Goal: Will remain free from infection Outcome: Progressing Goal: Diagnostic test results will improve Outcome: Progressing Goal: Respiratory complications will improve Outcome: Progressing Goal: Cardiovascular complication will be avoided Outcome: Progressing   Problem: Activity: Goal: Risk for activity intolerance will decrease Outcome: Progressing   Problem: Nutrition: Goal: Adequate nutrition will be maintained Outcome: Progressing   Problem: Coping: Goal: Level of anxiety will decrease Outcome: Progressing   Problem: Elimination: Goal: Will not experience complications related to bowel motility Outcome: Progressing Goal: Will not experience complications related to urinary retention Outcome: Progressing   Problem: Pain Managment: Goal: General experience of comfort will improve Outcome: Progressing   Problem: Safety: Goal: Ability to remain free from injury will improve Outcome: Progressing   Problem: Skin Integrity: Goal: Risk for impaired skin integrity will decrease Outcome: Progressing   Problem: Education: Goal: Knowledge of the prescribed therapeutic regimen will  improve Outcome: Progressing   Problem: Bowel/Gastric: Goal: Gastrointestinal status for postoperative course will improve Outcome: Progressing   Problem: Nutritional: Goal: Ability to achieve adequate nutritional intake will improve Outcome: Progressing   Problem: Clinical Measurements: Goal: Postoperative complications will be avoided or minimized Outcome: Progressing   Problem: Respiratory: Goal: Ability to maintain a clear airway will improve Outcome: Progressing   Problem: Skin Integrity: Goal: Demonstration of wound healing without infection will improve Outcome: Progressing   

## 2019-09-02 NOTE — Care Management (Signed)
Notifed by Frances Furbish that patient has been accepted for [facility] RN.

## 2019-09-03 ENCOUNTER — Inpatient Hospital Stay (HOSPITAL_COMMUNITY): Payer: Medicaid Other

## 2019-09-03 LAB — URINE CULTURE: Culture: NO GROWTH

## 2019-09-03 LAB — CBC
HCT: 36.6 % — ABNORMAL LOW (ref 39.0–52.0)
Hemoglobin: 12 g/dL — ABNORMAL LOW (ref 13.0–17.0)
MCH: 29.7 pg (ref 26.0–34.0)
MCHC: 32.8 g/dL (ref 30.0–36.0)
MCV: 90.6 fL (ref 80.0–100.0)
Platelets: 195 10*3/uL (ref 150–400)
RBC: 4.04 MIL/uL — ABNORMAL LOW (ref 4.22–5.81)
RDW: 12 % (ref 11.5–15.5)
WBC: 7.2 10*3/uL (ref 4.0–10.5)
nRBC: 0 % (ref 0.0–0.2)

## 2019-09-03 LAB — GLUCOSE, CAPILLARY
Glucose-Capillary: 116 mg/dL — ABNORMAL HIGH (ref 70–99)
Glucose-Capillary: 121 mg/dL — ABNORMAL HIGH (ref 70–99)
Glucose-Capillary: 122 mg/dL — ABNORMAL HIGH (ref 70–99)
Glucose-Capillary: 99 mg/dL (ref 70–99)
Glucose-Capillary: 115 mg/dL — ABNORMAL HIGH (ref 70–99)
Glucose-Capillary: 117 mg/dL — ABNORMAL HIGH (ref 70–99)

## 2019-09-03 MED ORDER — PRO-STAT SUGAR FREE PO LIQD: 30.0000 mL | Freq: Two times a day (BID) | ORAL | 3 refills | Status: DC

## 2019-09-03 MED ORDER — CLOZAPINE 50 MG PO TABS: 50.0000 mg | ORAL_TABLET | Freq: Three times a day (TID) | ORAL | Status: DC

## 2019-09-03 MED ORDER — OLANZAPINE 15 MG PO TABS: 15.0000 mg | ORAL_TABLET | Freq: Every day | ORAL | Status: DC

## 2019-09-03 MED ORDER — METOPROLOL TARTRATE 25 MG/10 ML ORAL SUSPENSION: 12.5000 mg | Freq: Two times a day (BID) | ORAL | 3 refills | Status: DC

## 2019-09-03 MED ORDER — ACETAMINOPHEN 160 MG/5ML PO SOLN: 650.0000 mg | Freq: Four times a day (QID) | ORAL | 0 refills | Status: DC | PRN

## 2019-09-03 MED ORDER — ACETAMINOPHEN 160 MG/5ML PO SOLN
650.0000 mg | Freq: Four times a day (QID) | ORAL | Status: DC | PRN
  Administered 2019-09-03 – 2019-09-04 (×3): 650 mg
  Filled 2019-09-03 (×3): qty 20.3

## 2019-09-03 MED ORDER — OXCARBAZEPINE 300 MG/5ML PO SUSP: 600.0000 mg | Freq: Two times a day (BID) | ORAL | 12 refills | Status: DC

## 2019-09-03 MED ORDER — CLONAZEPAM 1 MG PO TABS: 1.0000 mg | ORAL_TABLET | Freq: Every evening | ORAL | 0 refills | Status: DC | PRN

## 2019-09-03 MED ORDER — AMOXICILLIN-POT CLAVULANATE 875-125 MG PO TABS: 1.0000 | ORAL_TABLET | Freq: Two times a day (BID) | ORAL | 0 refills | Status: DC

## 2019-09-03 MED ORDER — OSMOLITE 1.5 CAL PO LIQD: 1000.0000 mL | ORAL | 30 refills | Status: DC

## 2019-09-03 MED ORDER — LEVALBUTEROL HCL 0.63 MG/3ML IN NEBU: 0.6300 mg | INHALATION_SOLUTION | Freq: Three times a day (TID) | RESPIRATORY_TRACT | Status: DC | PRN

## 2019-09-03 NOTE — Plan of Care (Signed)
  Problem: Activity: Goal: Ability to tolerate increased activity will improve Outcome: Progressing   Problem: Respiratory: Goal: Ability to maintain a clear airway and adequate ventilation will improve Outcome: Progressing   Problem: Role Relationship: Goal: Method of communication will improve Outcome: Progressing   Problem: Education: Goal: Knowledge of General Education information will improve Description: Including pain rating scale, medication(s)/side effects and non-pharmacologic comfort measures Outcome: Progressing   Problem: Health Behavior/Discharge Planning: Goal: Ability to manage health-related needs will improve Outcome: Progressing   Problem: Clinical Measurements: Goal: Ability to maintain clinical measurements within normal limits will improve Outcome: Progressing Goal: Will remain free from infection Outcome: Progressing Goal: Diagnostic test results will improve Outcome: Progressing Goal: Respiratory complications will improve Outcome: Progressing Goal: Cardiovascular complication will be avoided Outcome: Progressing   Problem: Activity: Goal: Risk for activity intolerance will decrease Outcome: Progressing   Problem: Nutrition: Goal: Adequate nutrition will be maintained Outcome: Progressing   Problem: Coping: Goal: Level of anxiety will decrease Outcome: Progressing   Problem: Elimination: Goal: Will not experience complications related to bowel motility Outcome: Progressing Goal: Will not experience complications related to urinary retention Outcome: Progressing   Problem: Pain Managment: Goal: General experience of comfort will improve Outcome: Progressing   Problem: Safety: Goal: Ability to remain free from injury will improve Outcome: Progressing   Problem: Skin Integrity: Goal: Risk for impaired skin integrity will decrease Outcome: Progressing   Problem: Education: Goal: Knowledge of the prescribed therapeutic regimen will  improve Outcome: Progressing   Problem: Bowel/Gastric: Goal: Gastrointestinal status for postoperative course will improve Outcome: Progressing   Problem: Nutritional: Goal: Ability to achieve adequate nutritional intake will improve Outcome: Progressing   Problem: Clinical Measurements: Goal: Postoperative complications will be avoided or minimized Outcome: Progressing   Problem: Respiratory: Goal: Ability to maintain a clear airway will improve Outcome: Progressing   Problem: Skin Integrity: Goal: Demonstration of wound healing without infection will improve Outcome: Progressing   

## 2019-09-03 NOTE — Care Management (Addendum)
Verified w Zack from [employer] that patient's tube feed supplies were delivered at 1:45 on Friday and signed by his father. HH services to be provided by [facility], notified them of his DC today.   However when I spoke w the patient's father Chanetta Marshall, he said that no formula was delivered and that the pump did not work. I have reached back out to Zack at [employer] to assess and get the patient feeds and a pump.   Father called me back and said that they have a functional pump now and osmolite.  I have notified Zack w [employer]  Father called again to state that Prostat was not delivered. I notified [employer] who will deliver ProSource (equivelant brand substitution). They are requesting that 3 days worth be sent home with him, I have requested the bedside nurse to provide this to the patient's father.   Julian Gray, Julian Gray Father [phone]  [phone]

## 2019-09-03 NOTE — Progress Notes (Signed)
     [address]       [place] [address]             [phone]       No events  Vitals:   09/03/19 0841 09/03/19 0900  BP: 115/68 99/70  Pulse:    Resp: (!) 23 (!) 24  Temp:    SpO2:      Alert NAD Sinus ClearB abd soft  CXR improved WBC 7  POD 10 following esophageal stent, and PEG tube placement Doing well Afebrile for 24hrs.  CXR improved Ok for discharge with f/u in 1 week Will complete 7 day course of abx for aspiration pneumonia  Jax Abdelrahman O Tamyka Bezio

## 2019-09-04 LAB — GLUCOSE, CAPILLARY
Glucose-Capillary: 132 mg/dL — ABNORMAL HIGH (ref 70–99)
Glucose-Capillary: 129 mg/dL — ABNORMAL HIGH (ref 70–99)
Glucose-Capillary: 136 mg/dL — ABNORMAL HIGH (ref 70–99)

## 2019-09-04 MED ORDER — OLANZAPINE 15 MG PO TABS: 15.0000 mg | ORAL_TABLET | Freq: Every day | ORAL | 0 refills | Status: DC

## 2019-09-04 MED ORDER — AMOXICILLIN-POT CLAVULANATE 875-125 MG PO TABS: 1.0000 | ORAL_TABLET | Freq: Two times a day (BID) | ORAL | 0 refills | Status: DC

## 2019-09-04 NOTE — Plan of Care (Signed)
Problem: Activity: Goal: Ability to tolerate increased activity will improve 09/04/2019 0721 by Shanon Ace, RN Outcome: Progressing 09/04/2019 0721 by Shanon Ace, RN Outcome: Progressing   Problem: Respiratory: Goal: Ability to maintain a clear airway and adequate ventilation will improve 09/04/2019 0721 by Shanon Ace, RN Outcome: Progressing 09/04/2019 0721 by Shanon Ace, RN Outcome: Progressing   Problem: Role Relationship: Goal: Method of communication will improve 09/04/2019 0721 by Shanon Ace, RN Outcome: Progressing 09/04/2019 0721 by Shanon Ace, RN Outcome: Progressing   Problem: Role Relationship: Goal: Method of communication will improve 09/04/2019 0721 by Shanon Ace, RN Outcome: Progressing 09/04/2019 0721 by Shanon Ace, RN Outcome: Progressing   Problem: Education: Goal: Knowledge of General Education information will improve Description: Including pain rating scale, medication(s)/side effects and non-pharmacologic comfort measures 09/04/2019 0721 by Shanon Ace, RN Outcome: Progressing 09/04/2019 0721 by Shanon Ace, RN Outcome: Progressing   Problem: Health Behavior/Discharge Planning: Goal: Ability to manage health-related needs will improve 09/04/2019 0721 by Shanon Ace, RN Outcome: Progressing 09/04/2019 0721 by Shanon Ace, RN Outcome: Progressing   Problem: Clinical Measurements: Goal: Ability to maintain clinical measurements within normal limits will improve 09/04/2019 0721 by Shanon Ace, RN Outcome: Progressing 09/04/2019 0721 by Shanon Ace, RN Outcome: Progressing Goal: Will remain free from infection 09/04/2019 0721 by Shanon Ace, RN Outcome: Progressing 09/04/2019 0721 by Shanon Ace, RN Outcome: Progressing Goal: Diagnostic test results will improve 09/04/2019 0721 by Shanon Ace, RN Outcome: Progressing 09/04/2019 0721 by Shanon Ace, RN Outcome: Progressing Goal: Respiratory complications will improve 09/04/2019 0721  by Shanon Ace, RN Outcome: Progressing 09/04/2019 0721 by Shanon Ace, RN Outcome: Progressing Goal: Cardiovascular complication will be avoided 09/04/2019 0721 by Shanon Ace, RN Outcome: Progressing 09/04/2019 0721 by Shanon Ace, RN Outcome: Progressing   Problem: Activity: Goal: Risk for activity intolerance will decrease 09/04/2019 0721 by Shanon Ace, RN Outcome: Progressing 09/04/2019 0721 by Shanon Ace, RN Outcome: Progressing   Problem: Nutrition: Goal: Adequate nutrition will be maintained 09/04/2019 0721 by Shanon Ace, RN Outcome: Progressing 09/04/2019 0721 by Shanon Ace, RN Outcome: Progressing   Problem: Coping: Goal: Level of anxiety will decrease 09/04/2019 0721 by Shanon Ace, RN Outcome: Progressing 09/04/2019 0721 by Shanon Ace, RN Outcome: Progressing   Problem: Elimination: Goal: Will not experience complications related to bowel motility 09/04/2019 0721 by Shanon Ace, RN Outcome: Progressing 09/04/2019 0721 by Shanon Ace, RN Outcome: Progressing Goal: Will not experience complications related to urinary retention 09/04/2019 0721 by Shanon Ace, RN Outcome: Progressing 09/04/2019 0721 by Shanon Ace, RN Outcome: Progressing   Problem: Pain Managment: Goal: General experience of comfort will improve 09/04/2019 0721 by Shanon Ace, RN Outcome: Progressing 09/04/2019 0721 by Shanon Ace, RN Outcome: Progressing   Problem: Safety: Goal: Ability to remain free from injury will improve 09/04/2019 0721 by Shanon Ace, RN Outcome: Progressing 09/04/2019 0721 by Shanon Ace, RN Outcome: Progressing   Problem: Skin Integrity: Goal: Risk for impaired skin integrity will decrease 09/04/2019 0721 by Shanon Ace, RN Outcome: Progressing 09/04/2019 0721 by Shanon Ace, RN Outcome: Progressing   Problem: Education: Goal: Knowledge of the prescribed therapeutic regimen will improve 09/04/2019 0721 by Shanon Ace, RN Outcome:  Progressing 09/04/2019 0721 by Shanon Ace, RN Outcome: Progressing   Problem: Bowel/Gastric: Goal: Gastrointestinal status for postoperative course will improve 09/04/2019 0721 by Shanon Ace, RN Outcome: Progressing 09/04/2019 0721 by Shanon Ace, RN Outcome: Progressing   Problem: Nutritional: Goal: Ability to achieve adequate nutritional intake will improve 09/04/2019 0721 by Shanon Ace, RN Outcome: Progressing 09/04/2019  [id number] by Luna Kitchens, RN Outcome: Progressing   Problem: Clinical Measurements: Goal: Postoperative complications will be avoided or minimized 09/04/2019 0721 by Luna Kitchens, RN Outcome: Progressing 09/04/2019 0721 by Luna Kitchens, RN Outcome: Progressing   Problem: Respiratory: Goal: Ability to maintain a clear airway will improve 09/04/2019 0721 by Luna Kitchens, RN Outcome: Progressing 09/04/2019 0721 by Luna Kitchens, RN Outcome: Progressing   Problem: Skin Integrity: Goal: Demonstration of wound healing without infection will improve 09/04/2019 0721 by Luna Kitchens, RN Outcome: Progressing 09/04/2019 0721 by Luna Kitchens, RN Outcome: Progressing

## 2019-09-04 NOTE — Plan of Care (Signed)
  Problem: Activity: Goal: Ability to tolerate increased activity will improve Outcome: Progressing   Problem: Respiratory: Goal: Ability to maintain a clear airway and adequate ventilation will improve Outcome: Progressing   Problem: Role Relationship: Goal: Method of communication will improve Outcome: Progressing   Problem: Education: Goal: Knowledge of General Education information will improve Description: Including pain rating scale, medication(s)/side effects and non-pharmacologic comfort measures Outcome: Progressing   Problem: Health Behavior/Discharge Planning: Goal: Ability to manage health-related needs will improve Outcome: Progressing   Problem: Clinical Measurements: Goal: Ability to maintain clinical measurements within normal limits will improve Outcome: Progressing Goal: Will remain free from infection Outcome: Progressing Goal: Diagnostic test results will improve Outcome: Progressing Goal: Respiratory complications will improve Outcome: Progressing Goal: Cardiovascular complication will be avoided Outcome: Progressing   Problem: Activity: Goal: Risk for activity intolerance will decrease Outcome: Progressing   Problem: Nutrition: Goal: Adequate nutrition will be maintained Outcome: Progressing   Problem: Coping: Goal: Level of anxiety will decrease Outcome: Progressing   Problem: Elimination: Goal: Will not experience complications related to bowel motility Outcome: Progressing Goal: Will not experience complications related to urinary retention Outcome: Progressing   Problem: Pain Managment: Goal: General experience of comfort will improve Outcome: Progressing   Problem: Safety: Goal: Ability to remain free from injury will improve Outcome: Progressing   Problem: Skin Integrity: Goal: Risk for impaired skin integrity will decrease Outcome: Progressing   Problem: Education: Goal: Knowledge of the prescribed therapeutic regimen will  improve Outcome: Progressing   Problem: Bowel/Gastric: Goal: Gastrointestinal status for postoperative course will improve Outcome: Progressing   Problem: Nutritional: Goal: Ability to achieve adequate nutritional intake will improve Outcome: Progressing   Problem: Clinical Measurements: Goal: Postoperative complications will be avoided or minimized Outcome: Progressing   Problem: Respiratory: Goal: Ability to maintain a clear airway will improve Outcome: Progressing   Problem: Skin Integrity: Goal: Demonstration of wound healing without infection will improve Outcome: Progressing

## 2019-09-04 NOTE — Progress Notes (Signed)
Discharge instructions and teaching reviewed with his parents.. Pt discharged home with family. Taken front of hospital via wc

## 2019-09-04 NOTE — Discharge Instructions (Signed)
Discharge Instructions:  In regards to Fish Oil and Multivitamin.Marland KitchenMarland Kitchen I would suggest taking a liquid form through his feeding tube   1. Patient is to have nothing by mouth 2. Please run tube feedings continually at 60 ml/hr 3. All medications should be place through the feeding tube, can crush tablets if needed 4. Activity as tolerated 5. Please contact office at [phone] if any problems arise

## 2019-09-04 NOTE — TOC Transition Note (Signed)
Transition of Care [facility]) - CM/SW Discharge Note   Patient Details  Name: Julian Gray MRN: [id number] Date of Birth: 1986-02-08  Transition of [facility]) CM/SW Contact:  Leone Haven, RN Phone Number: 09/04/2019, 12:04 PM   Clinical Narrative:    Patient was accepted by Frances Furbish for [facility], per previous NCM note.  NCM notified Frances Furbish that patient is for dc today.  NCM spoke with father and he states they have the tube feed and the pump, they are all set.     Final next level of care: Home w Home Health Services Barriers to Discharge: No Barriers Identified   Patient Goals and CMS Choice   CMS Medicare.gov Compare Post Acute Care list provided to:: Patient Represenative (must comment) Choice offered to / list presented to : Parent  Discharge Placement                       Discharge Plan and Services   Discharge Planning Services: CM Consult Post Acute Care Choice: Home Health, Durable Medical Equipment          DME Arranged: Tube feeding, Tube feeding pump DME Agency: [facility] Date DME Agency Contacted: 08/31/19 Time DME Agency Contacted: [phone] Representative spoke with at DME Agency: zach HH Arranged: RN HH Agency: [facility] Date [facility] Agency Contacted: 09/03/19 Time HH Agency Contacted: [phone] Representative spoke with at [facility] Agency: Kandee Keen  Social Determinants of Health (SDOH) Interventions     Readmission Risk Interventions No flowsheet data found.

## 2019-09-04 NOTE — Progress Notes (Signed)
      [address]       [place] [address]             [phone]      11 Days Post-Op Procedure(s) (LRB): ESOPHAGOGASTRODUODENOSCOPY (EGD) percutaneous gastrostomy tube placement, esophageal stent placement, right chest tube placement (Right) PERCUTANEOUS INSERTION OF GASTROSTOMY TUBE (N/A) CHEST TUBE INSERTION (Right)   Subjective:  Up watching cartoons.  Denies pain.    Objective: Vital signs in last 24 hours: Temp:  [97 F (36.1 C)-98.5 F (36.9 C)] 98 F (36.7 C) (02/01 0414) Pulse Rate:  [95-104] 104 (02/01 0414) Cardiac Rhythm: Normal sinus rhythm (02/01 0414) Resp:  [18-27] 20 (02/01 0458) BP: (92-146)/(49-90) 110/64 (02/01 0414) SpO2:  [98 %] 98 % (02/01 0414)  Intake/Output from previous day: 01/31 0701 - 02/01 0700 In: 629 [I.V.:3; NG/GT:626] Out: 500 [Urine:500]  General appearance: alert, cooperative and no distress Heart: regular rate and rhythm Lungs: clear to auscultation bilaterally Abdomen: soft, non-tender; bowel sounds normal; no masses,  no organomegaly Extremities: extremities normal, atraumatic, no cyanosis or edema Wound: Feeding tube site, clean  Lab Results: Recent Labs    09/02/19 0225 09/03/19 0242  WBC 7.4 7.2  HGB 12.1* 12.0*  HCT 37.0* 36.6*  PLT 176 195   BMET:  Recent Labs    09/02/19 0225  NA 137  K 4.1  CL 103  CO2 27  GLUCOSE 122*  BUN 20  CREATININE 0.66  CALCIUM 8.4*    PT/INR: No results for input(s): LABPROT, INR in the last 72 hours. ABG    Component Value Date/Time   PHART 7.433 08/25/2019 0508   HCO3 25.0 08/25/2019 0508   TCO2 26 08/25/2019 0508   ACIDBASEDEF 0.2 08/24/2019 2020   O2SAT 100.0 08/25/2019 0508   CBG (last 3)  Recent Labs    09/03/19 1923 09/03/19 2315 09/04/19 0413  GLUCAP 115* 117* 132*    Assessment/Plan: S/P Procedure(s) (LRB): ESOPHAGOGASTRODUODENOSCOPY (EGD) percutaneous gastrostomy tube placement, esophageal stent placement, right chest tube  placement (Right) PERCUTANEOUS INSERTION OF GASTROSTOMY TUBE (N/A) CHEST TUBE INSERTION (Right)  1. S/P EGD with stent placement PEG tube placement- doing well, developed mild aspiration pneumonia and will be kept NPO 2. GI- on tube feedings at goal, continue for now 3. ID- remains afebrile, no leukocytosis developed, will continue Augmentin for 1 week course 4. Dispo- patient is stable, will d/c home today, H/H arrangements have been made   LOS: 11 days    Lowella Dandy, PA-C  09/04/2019

## 2019-09-06 ENCOUNTER — Other Ambulatory Visit: Payer: Self-pay | Admitting: Thoracic Surgery (Cardiothoracic Vascular Surgery)

## 2019-09-06 DIAGNOSIS — J982 Interstitial emphysema: Secondary | ICD-10-CM

## 2019-09-06 LAB — CULTURE, BLOOD (ROUTINE X 2)
Culture: NO GROWTH
Culture: NO GROWTH
Special Requests: ADEQUATE
Special Requests: ADEQUATE

## 2019-09-08 ENCOUNTER — Other Ambulatory Visit: Payer: Self-pay | Admitting: *Deleted

## 2019-09-08 ENCOUNTER — Ambulatory Visit (INDEPENDENT_AMBULATORY_CARE_PROVIDER_SITE_OTHER): Payer: Medicare Other | Admitting: Thoracic Surgery (Cardiothoracic Vascular Surgery)

## 2019-09-08 ENCOUNTER — Telehealth: Payer: Self-pay

## 2019-09-08 ENCOUNTER — Other Ambulatory Visit: Payer: Self-pay

## 2019-09-08 ENCOUNTER — Encounter: Payer: Self-pay | Admitting: Thoracic Surgery (Cardiothoracic Vascular Surgery)

## 2019-09-08 ENCOUNTER — Ambulatory Visit
Admission: RE | Admit: 2019-09-08 | Discharge: 2019-09-08 | Disposition: A | Discharge status: Home / Self Care | Payer: Medicare Other | Source: Ambulatory Visit | Attending: Thoracic Surgery (Cardiothoracic Vascular Surgery) | Admitting: Thoracic Surgery (Cardiothoracic Vascular Surgery)

## 2019-09-08 VITALS — BP 141/89 | HR 105 | Temp 97.7°F | Resp 20 | Ht 72.0 in | Wt 217.0 lb

## 2019-09-08 DIAGNOSIS — K223 Perforation of esophagus: Secondary | ICD-10-CM

## 2019-09-08 DIAGNOSIS — Z09 Encounter for follow-up examination after completed treatment for conditions other than malignant neoplasm: Secondary | ICD-10-CM | POA: Diagnosis not present

## 2019-09-08 DIAGNOSIS — J982 Interstitial emphysema: Secondary | ICD-10-CM

## 2019-09-08 NOTE — Telephone Encounter (Signed)
Pt's mom called to ask about the recommended administration of Metamucil through pt's PEG tube. She states that Dr. Cliffton Asters recommended Metamucil for constipation at today's OV, but she is unsure whether she should mix it with the suggested 8 ounces of water to be given at once. Suggested that she split the dose in half and give four hours apart to prevent reflux. Pt's mom also concerned because there is "a little" leakage around the PEG tube site. Instructed to monitor and report if there is a significant increase; otherwise, to change drain sponge more frequently and keep surrounding area clean and dry to prevent skin irritation. To contact office prn problems/concerns.

## 2019-09-08 NOTE — Progress Notes (Signed)
      [address]       [place] [address]             [phone]        Julian Gray [facility] Medical Record #[id number] Date of Birth: 08/10/1985  Referring: Gaspar Garbe, MD Primary Care: Tisovec, Adelfa Koh, MD Primary Cardiologist:No primary care provider on file.  Reason for visit:   follow-up  History of Present Illness:     Doing well Tolerating tube feeds,  Energy better  Physical Exam: BP (!) 141/89 (BP Location: Left Arm)   Pulse (!) 105   Temp 97.7 F (36.5 C) (Skin)   Resp 20   Ht 6' (1.829 m)   Wt 217 lb (98.4 kg)   SpO2 95% Comment: RA  BMI 29.43 kg/m   Alert NAD PEG tube in position, site clean Abdomen soft, ND       Assessment / Plan:   34 yo male with esophageal perforation, s/p esophageal stent placement, and PEG tube Will take back to the OR on 2/10 for EGD, esophageal stent removal, and esophagram. Will speak with home health about cycling tube feeds.   Corliss Skains 09/08/2019 11:30 AM

## 2019-09-08 NOTE — Telephone Encounter (Signed)
New orders sent to [facility] and [facility] home health to increase Julian Gray's tube feeding times to 10 PM - 8 AM every daily, starting today

## 2019-09-09 ENCOUNTER — Encounter (HOSPITAL_COMMUNITY): Payer: Self-pay

## 2019-09-09 ENCOUNTER — Inpatient Hospital Stay (HOSPITAL_COMMUNITY)
Admission: EM | Admit: 2019-09-09 | Discharge: 2019-09-12 | DRG: 393 | Disposition: A | Discharge status: Home Health | Payer: Medicare Other | Attending: Internal Medicine | Admitting: Internal Medicine

## 2019-09-09 ENCOUNTER — Emergency Department (HOSPITAL_COMMUNITY): Payer: Medicare Other

## 2019-09-09 ENCOUNTER — Other Ambulatory Visit: Payer: Self-pay

## 2019-09-09 DIAGNOSIS — Z20822 Contact with and (suspected) exposure to covid-19: Secondary | ICD-10-CM | POA: Diagnosis present

## 2019-09-09 DIAGNOSIS — G40909 Epilepsy, unspecified, not intractable, without status epilepticus: Secondary | ICD-10-CM

## 2019-09-09 DIAGNOSIS — F79 Unspecified intellectual disabilities: Secondary | ICD-10-CM | POA: Diagnosis present

## 2019-09-09 DIAGNOSIS — Z419 Encounter for procedure for purposes other than remedying health state, unspecified: Secondary | ICD-10-CM

## 2019-09-09 DIAGNOSIS — T85528A Displacement of other gastrointestinal prosthetic devices, implants and grafts, initial encounter: Secondary | ICD-10-CM

## 2019-09-09 DIAGNOSIS — R0902 Hypoxemia: Secondary | ICD-10-CM | POA: Diagnosis not present

## 2019-09-09 DIAGNOSIS — D649 Anemia, unspecified: Secondary | ICD-10-CM | POA: Diagnosis not present

## 2019-09-09 DIAGNOSIS — K223 Perforation of esophagus: Secondary | ICD-10-CM | POA: Diagnosis not present

## 2019-09-09 DIAGNOSIS — G4700 Insomnia, unspecified: Secondary | ICD-10-CM | POA: Diagnosis not present

## 2019-09-09 DIAGNOSIS — J982 Interstitial emphysema: Secondary | ICD-10-CM

## 2019-09-09 DIAGNOSIS — Y848 Other medical procedures as the cause of abnormal reaction of the patient, or of later complication, without mention of misadventure at the time of the procedure: Secondary | ICD-10-CM | POA: Diagnosis present

## 2019-09-09 DIAGNOSIS — K9420 Gastrostomy complication, unspecified: Secondary | ICD-10-CM | POA: Diagnosis not present

## 2019-09-09 DIAGNOSIS — K9423 Gastrostomy malfunction: Secondary | ICD-10-CM

## 2019-09-09 DIAGNOSIS — R000 Tachycardia, unspecified: Secondary | ICD-10-CM | POA: Diagnosis present

## 2019-09-09 DIAGNOSIS — J189 Pneumonia, unspecified organism: Secondary | ICD-10-CM | POA: Diagnosis not present

## 2019-09-09 DIAGNOSIS — Z9281 Personal history of extracorporeal membrane oxygenation (ECMO): Secondary | ICD-10-CM

## 2019-09-09 DIAGNOSIS — S1121XD Laceration without foreign body of pharynx and cervical esophagus, subsequent encounter: Secondary | ICD-10-CM

## 2019-09-09 LAB — CBC WITH DIFFERENTIAL/PLATELET
Abs Immature Granulocytes: 0.02 10*3/uL (ref 0.00–0.07)
Basophils Absolute: 0 10*3/uL (ref 0.0–0.1)
Basophils Relative: 1 %
Eosinophils Absolute: 0.4 10*3/uL (ref 0.0–0.5)
Eosinophils Relative: 6 %
HCT: 37.9 % — ABNORMAL LOW (ref 39.0–52.0)
Hemoglobin: 12.5 g/dL — ABNORMAL LOW (ref 13.0–17.0)
Immature Granulocytes: 0 %
Lymphocytes Relative: 25 %
Lymphs Abs: 1.4 10*3/uL (ref 0.7–4.0)
MCH: 30.3 pg (ref 26.0–34.0)
MCHC: 33 g/dL (ref 30.0–36.0)
MCV: 92 fL (ref 80.0–100.0)
Monocytes Absolute: 0.5 10*3/uL (ref 0.1–1.0)
Monocytes Relative: 9 %
Neutro Abs: 3.5 10*3/uL (ref 1.7–7.7)
Neutrophils Relative %: 59 %
Platelets: 341 10*3/uL (ref 150–400)
RBC: 4.12 MIL/uL — ABNORMAL LOW (ref 4.22–5.81)
RDW: 11.9 % (ref 11.5–15.5)
WBC: 5.9 10*3/uL (ref 4.0–10.5)
nRBC: 0 % (ref 0.0–0.2)

## 2019-09-09 LAB — BASIC METABOLIC PANEL
Anion gap: 9 (ref 5–15)
BUN: 21 mg/dL — ABNORMAL HIGH (ref 6–20)
CO2: 28 mmol/L (ref 22–32)
Calcium: 8.9 mg/dL (ref 8.9–10.3)
Chloride: 108 mmol/L (ref 98–111)
Creatinine, Ser: 0.81 mg/dL (ref 0.61–1.24)
GFR calc Af Amer: >60 mL/min (ref >60)
GFR calc non Af Amer: >60 mL/min (ref >60)
Glucose, Bld: 97 mg/dL (ref 70–99)
Potassium: 3.9 mmol/L (ref 3.5–5.1)
Sodium: 145 mmol/L (ref 135–145)

## 2019-09-09 LAB — RESPIRATORY PANEL BY RT PCR (FLU A&B, COVID)
Influenza A by PCR: NEGATIVE
Influenza B by PCR: NEGATIVE
SARS Coronavirus 2 by RT PCR: NEGATIVE

## 2019-09-09 MED ORDER — METOPROLOL TARTRATE 5 MG/5ML IV SOLN
2.5000 mg | Freq: Four times a day (QID) | INTRAVENOUS | Status: DC
  Administered 2019-09-09 – 2019-09-10 (×3): 2.5 mg via INTRAVENOUS
  Filled 2019-09-09 (×5): qty 5

## 2019-09-09 MED ORDER — ALBUTEROL SULFATE (2.5 MG/3ML) 0.083% IN NEBU: 2.5000 mg | INHALATION_SOLUTION | Freq: Four times a day (QID) | RESPIRATORY_TRACT | Status: DC | PRN

## 2019-09-09 MED ORDER — SORBITOL 70 % SOLN
960.0000 mL | TOPICAL_OIL | Freq: Once | ORAL | Status: AC
  Administered 2019-09-10: 960 mL via RECTAL
  Filled 2019-09-09: qty 473

## 2019-09-09 MED ORDER — BISACODYL 10 MG RE SUPP
10.0000 mg | Freq: Once | RECTAL | Status: AC
  Administered 2019-09-09: 10 mg via RECTAL
  Filled 2019-09-09: qty 1

## 2019-09-09 MED ORDER — LORAZEPAM 2 MG/ML IJ SOLN
1.0000 mg | INTRAMUSCULAR | Status: DC | PRN
  Filled 2019-09-09: qty 1

## 2019-09-09 MED ORDER — IOHEXOL 300 MG/ML  SOLN
100.0000 mL | Freq: Once | INTRAMUSCULAR | Status: AC | PRN
  Administered 2019-09-09: 15:00:00 100 mL via INTRAVENOUS

## 2019-09-09 MED ORDER — ONDANSETRON HCL 4 MG/2ML IJ SOLN: 4.0000 mg | Freq: Four times a day (QID) | INTRAMUSCULAR | Status: DC | PRN

## 2019-09-09 MED ORDER — BISACODYL 10 MG RE SUPP: 10.0000 mg | Freq: Once | RECTAL | Status: DC

## 2019-09-09 MED ORDER — ACETAMINOPHEN 650 MG RE SUPP: 650.0000 mg | Freq: Four times a day (QID) | RECTAL | Status: DC | PRN

## 2019-09-09 MED ORDER — KCL IN DEXTROSE-NACL 20-5-0.45 MEQ/L-%-% IV SOLN
INTRAVENOUS | Status: DC
  Filled 2019-09-09 (×8): qty 1000

## 2019-09-09 MED ORDER — ACETAMINOPHEN 325 MG PO TABS: 650.0000 mg | ORAL_TABLET | Freq: Four times a day (QID) | ORAL | Status: DC | PRN

## 2019-09-09 MED ORDER — LORAZEPAM 2 MG/ML IJ SOLN
0.5000 mg | Freq: Every day | INTRAMUSCULAR | Status: DC
  Administered 2019-09-10 – 2019-09-11 (×3): 1 mg via INTRAVENOUS
  Filled 2019-09-09 (×2): qty 1

## 2019-09-09 MED ORDER — ENOXAPARIN SODIUM 40 MG/0.4ML ~~LOC~~ SOLN
40.0000 mg | SUBCUTANEOUS | Status: DC
  Administered 2019-09-09 – 2019-09-11 (×3): 40 mg via SUBCUTANEOUS
  Filled 2019-09-09 (×3): qty 0.4

## 2019-09-09 MED ORDER — ONDANSETRON HCL 4 MG PO TABS: 4.0000 mg | ORAL_TABLET | Freq: Four times a day (QID) | ORAL | Status: DC | PRN

## 2019-09-09 NOTE — H&P (Addendum)
History and Physical    Julian Gray NGE:[id number] DOB: 03-24-1986 DOA: 09/09/2019  Referring MD/NP/PA: Marciano Sequin, MD (resident) PCP: Haywood Pao, MD  Patient coming from: Home  Chief Complaint: Leaking around G-tube  I have personally briefly reviewed patient's old medical records in [place]   HPI: Julian Gray is a 34 y.o. male with medical history significant of perinatal anoxic brain injury, mental retardation, and seizure disorder.  He presents with his father who provides history with complaints of leaking around G-tube site since yesterday afternoon.  He had just followed up with Dr. Kipp Brood before the issue began.  His father notes that he must have dislodged it somehow.  This morning he did complain of some pain around G-tube site this morning. He is scheduled to have a follow-up EGD on 2/11 with evaluation to see if esophageal stent can be removed from recent hospitalization for esophageal perforation with pneumomediastinum where he had to have the PEG tube placed on 1/21.   Denies any significant signs of redness around the G-tube site or any reports of bleeding.  His father denies any recent seizures, but does note that the patient has been constipated  ED Course: Patient was noted to be afebrile and mildly tachycardic and all other vital signs maintained.  Labs relatively unremarkable from patient baseline. Influenza and COVID-19 screening were negative.  CT scan of the abdomen revealed gastrostomy tube outside of the stomach within the subcutaneous soft tissues.  Dr. Kipp Brood of cardiothoracic surgery was notified, but recommended IR to evaluate.  Plan to keep patient n.p.o. for now and IR we will evaluate in a.m. Patient was placed on D5 -0.45%NaCL with 20 mEq of KCl at 100 mL/h.  TRH called to admit.  Review of Systems  Unable to perform ROS: Patient nonverbal  Neurological: Negative for seizures.    Past Medical History:  Diagnosis Date  . Epilepsy  ([facility])   . Mental retardation   . Personal history of extracorporeal membrane oxygenation (ECMO)     Past Surgical History:  Procedure Laterality Date  . CHEST TUBE INSERTION Right 08/24/2019   Procedure: CHEST TUBE INSERTION;  Surgeon: Lajuana Matte, MD;  Location: [facility];  Service: Thoracic;  Laterality: Right;  . ESOPHAGOGASTRODUODENOSCOPY Right 08/24/2019   Procedure: ESOPHAGOGASTRODUODENOSCOPY (EGD) percutaneous gastrostomy tube placement, esophageal stent placement, right chest tube placement;  Surgeon: Lajuana Matte, MD;  Location: [facility];  Service: Thoracic;  Laterality: Right;  . REMOVAL OF GASTROSTOMY TUBE N/A 08/24/2019   Procedure: PERCUTANEOUS INSERTION OF GASTROSTOMY TUBE;  Surgeon: Lajuana Matte, MD;  Location: [facility];  Service: Thoracic;  Laterality: N/A;     reports that he has never smoked. He has never used smokeless tobacco. He reports that he does not drink alcohol or use drugs.  No Known Allergies  Family History  Problem Relation Age of Onset  . Ataxia Neg Hx   . Chorea Neg Hx   . Dementia Neg Hx   . Mental retardation Neg Hx   . Migraines Neg Hx   . Multiple sclerosis Neg Hx   . Neurofibromatosis Neg Hx   . Neuropathy Neg Hx   . Parkinsonism Neg Hx   . Seizures Neg Hx   . Stroke Neg Hx     Prior to Admission medications   Medication Sig Start Date End Date Taking? Authorizing Provider  acetaminophen (TYLENOL) 160 MG/5ML solution Place 20.3 mLs (650 mg total) into feeding tube every 6 (six) hours  as needed for mild pain or fever. 09/03/19  Yes Barrett, Erin R, PA-C  Amino Acids-Protein Hydrolys (FEEDING SUPPLEMENT, PRO-STAT SUGAR FREE 64,) LIQD Place 30 mLs into feeding tube 2 (two) times daily. 09/03/19  Yes Barrett, Erin R, PA-C  amoxicillin-clavulanate (AUGMENTIN) 875-125 MG tablet Place 1 tablet into feeding tube every 12 (twelve) hours for 7 days. 09/04/19 09/11/19 Yes Barrett, Erin R, PA-C  clonazePAM (KLONOPIN) 1 MG tablet Place 1 tablet (1  mg total) into feeding tube at bedtime as needed (sleep). Patient taking differently: Place 1 mg into feeding tube at bedtime.  09/03/19  Yes Barrett, Erin R, PA-C  cloZAPine (CLOZARIL) 50 MG tablet Place 1 tablet (50 mg total) into feeding tube 3 (three) times daily. 09/03/19  Yes Barrett, Erin R, PA-C  metoprolol tartrate (LOPRESSOR) 25 mg/10 mL SUSP Place 5 mLs (12.5 mg total) into feeding tube 2 (two) times daily. 09/03/19  Yes Barrett, Erin R, PA-C  Nutritional Supplements (FEEDING SUPPLEMENT, OSMOLITE 1.5 CAL,) LIQD Place 1,000 mLs into feeding tube continuous. 09/03/19  Yes Barrett, Erin R, PA-C  OLANZapine (ZYPREXA) 15 MG tablet Place 1 tablet (15 mg total) into feeding tube at bedtime. 09/04/19  Yes Barrett, Erin R, PA-C  OXcarbazepine (TRILEPTAL) 300 MG/5ML suspension Place 10 mLs (600 mg total) into feeding tube 2 (two) times daily. 09/03/19  Yes Barrett, Rae Roam, PA-C    Physical Exam:  Constitutional: NAD, calm, comfortable Vitals:   09/09/19 1227  BP: (!) 124/110  Pulse: (!) 102  Resp: 14  Temp: (!) 97.4 F (36.3 C)  TempSrc: Axillary  SpO2: 97%   Eyes: PERRL, lids and conjunctivae normal ENMT: Mucous membranes are moist. Posterior pharynx clear of any exudate or lesions.  Neck: normal, supple, no masses, no thyromegaly Respiratory: clear to auscultation bilaterally, no wheezing, no crackles. Normal respiratory effort. No accessory muscle use.  Cardiovascular: Regular rate and rhythm, no murmurs / rubs / gallops. No extremity edema. 2+ pedal pulses. No carotid bruits.  Abdomen: no tenderness, G-tube present of the abdomen. Musculoskeletal: no clubbing / cyanosis. No joint deformity upper and lower extremities. Good ROM, no contractures. Normal muscle tone.  Skin: no rashes, lesions, ulcers. No induration Neurologic: CN 2-12 grossly intact. Sensation intact, DTR normal. Strength 5/5 in all 4.  Mild tremor noted of the left hand.   Psychiatric: Alert, but nonverbal    Labs on  Admission: I have personally reviewed following labs and imaging studies  CBC: Recent Labs  Lab 09/03/19 0242 09/09/19 1335  WBC 7.2 5.9  NEUTROABS  --  3.5  HGB 12.0* 12.5*  HCT 36.6* 37.9*  MCV 90.6 92.0  PLT 195 341   Basic Metabolic Panel: Recent Labs  Lab 09/09/19 1335  NA 145  K 3.9  CL 108  CO2 28  GLUCOSE 97  BUN 21*  CREATININE 0.81  CALCIUM 8.9   GFR: Estimated Creatinine Clearance: 160.5 mL/min (by C-G formula based on SCr of 0.81 mg/dL). Liver Function Tests: No results for input(s): AST, ALT, ALKPHOS, BILITOT, PROT, ALBUMIN in the last 168 hours. No results for input(s): LIPASE, AMYLASE in the last 168 hours. No results for input(s): AMMONIA in the last 168 hours. Coagulation Profile: No results for input(s): INR, PROTIME in the last 168 hours. Cardiac Enzymes: No results for input(s): CKTOTAL, CKMB, CKMBINDEX, TROPONINI in the last 168 hours. BNP (last 3 results) No results for input(s): PROBNP in the last 8760 hours. HbA1C: No results for input(s): HGBA1C in the last 72 hours.  CBG: Recent Labs  Lab 09/03/19 1923 09/03/19 2315 09/04/19 0413 09/04/19 0742 09/04/19 1040  GLUCAP 115* 117* 132* 129* 136*   Lipid Profile: No results for input(s): CHOL, HDL, LDLCALC, TRIG, CHOLHDL, LDLDIRECT in the last 72 hours. Thyroid Function Tests: No results for input(s): TSH, T4TOTAL, FREET4, T3FREE, THYROIDAB in the last 72 hours. Anemia Panel: No results for input(s): VITAMINB12, FOLATE, FERRITIN, TIBC, IRON, RETICCTPCT in the last 72 hours. Urine analysis:    Component Value Date/Time   COLORURINE YELLOW 09/01/2019 2054   APPEARANCEUR CLOUDY (A) 09/01/2019 2054   LABSPEC 1.024 09/01/2019 2054   PHURINE 8.0 09/01/2019 2054   GLUCOSEU NEGATIVE 09/01/2019 2054   HGBUR NEGATIVE 09/01/2019 2054   BILIRUBINUR NEGATIVE 09/01/2019 2054   KETONESUR NEGATIVE 09/01/2019 2054   PROTEINUR NEGATIVE 09/01/2019 2054   NITRITE NEGATIVE 09/01/2019 2054    LEUKOCYTESUR NEGATIVE 09/01/2019 2054   Sepsis Labs: Recent Results (from the past 240 hour(s))  Culture, blood (Routine X 2) w Reflex to ID Panel     Status: None   Collection Time: 09/01/19  8:10 PM   Specimen: BLOOD LEFT HAND  Result Value Ref Range Status   Specimen Description BLOOD LEFT HAND  Final   Special Requests   Final    BOTTLES DRAWN AEROBIC AND ANAEROBIC Blood Culture adequate volume   Culture   Final    NO GROWTH 5 DAYS Performed at [facility], [address] [place], [place] [address]    Report Status 09/06/2019 FINAL  Final  Culture, blood (Routine X 2) w Reflex to ID Panel     Status: None   Collection Time: 09/01/19  8:14 PM   Specimen: BLOOD  Result Value Ref Range Status   Specimen Description BLOOD LEFT KNUCKLE  Final   Special Requests   Final    BOTTLES DRAWN AEROBIC AND ANAEROBIC Blood Culture adequate volume   Culture   Final    NO GROWTH 5 DAYS Performed at [facility], [address], [place], [place] [address]    Report Status 09/06/2019 FINAL  Final  Urine Culture     Status: None   Collection Time: 09/01/19  8:54 PM   Specimen: Urine, Random  Result Value Ref Range Status   Specimen Description URINE, RANDOM  Final   Special Requests NONE  Final   Culture   Final    NO GROWTH Performed at [facility], [address] [place], [place] [address]    Report Status 09/03/2019 FINAL  Final     Radiological Exams on Admission: DG Chest 2 View  Result Date: 09/08/2019 CLINICAL DATA:  Pneumomediastinum EXAM: CHEST - 2 VIEW COMPARISON:  09/03/2019 FINDINGS: Lungs are clear. No pleural effusion or pneumothorax. Stable cardiomediastinal contours. No pneumomediastinum. Esophageal stent is noted. Residual contrast within the colon. IMPRESSION: Clear lungs.  No significant abnormality. Electronically Signed   By: Guadlupe Spanish M.D.   On: 09/08/2019 11:05   CT ABDOMEN PELVIS W CONTRAST  Result Date: 09/09/2019 CLINICAL DATA:   Nausea, vomiting.  Concern for G-tube malfunction. EXAM: CT ABDOMEN AND PELVIS WITH CONTRAST TECHNIQUE: Multidetector CT imaging of the abdomen and pelvis was performed using the standard protocol following bolus administration of intravenous contrast. CONTRAST:  OMNIPAQUE IOHEXOL 300 MG/ML  SOLN COMPARISON:  None FINDINGS: Lower chest: Metallic stent within the visualized distal esophagus. No confluent opacities in the lung bases. No effusions. Hepatobiliary: No focal hepatic abnormality. Gallbladder unremarkable. Pancreas: No focal abnormality  or ductal dilatation. Spleen: No focal abnormality.  Normal size. Adrenals/Urinary Tract: No adrenal abnormality. No focal renal abnormality. No stones or hydronephrosis. Urinary bladder is unremarkable. Stomach/Bowel: Gastrostomy tube is outside of the stomach. The retention button is within the subcutaneous soft tissues of the upper abdominal wall along the surface of the left rectus muscle. Stomach, large and small bowel grossly unremarkable. Vascular/Lymphatic: No evidence of aneurysm or adenopathy. Reproductive: No visible focal abnormality. Other: No free fluid or free air. Musculoskeletal: No acute bony abnormality. IMPRESSION: Gastrostomy tube outside of the stomach within the subcutaneous soft tissues as described above. Otherwise no acute findings in the abdomen or pelvis. Electronically Signed   By: Charlett Nose M.D.   On: 09/09/2019 15:11      Assessment/Plan Malfunction of gastrostomy tube: Patient presents with leaking around the gastrostomy tube.  Appears to have been dislodged, and CT scan of the abdomen revealed that it was the subcutaneous soft tissues.  IR plans on evaluating patient in a.m. -Admit to MedSurg bed -N.p.o. -D5-0.45% normal saline IV fluids with 20 mEq of KCl at 100 mL/h overnight -Appreciate IR consultative services  Esophageal perforation with pneumomediastinum: Patient with recent admission for this and currently has  esophageal stent. -Continue outpatient follow-up with cardiothoracic surgery  Normocytic anemia: Hemoglobin 12.5 which appears near patient's baseline.  No reports of bleeding per family. -Continue to monitor  Tachycardia: Acute.  Patient normally metoprolol tartrate 12.5 mg twice daily. 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 -Seizure precautions -Ativan as needed for seizure activity  Insomnia: Patient normally on Klonopin 1 mg nightly. -Substituting Ativan 0.5 -1 mg IV  Constipation -Smog enema  Restart home medications when G-tube noted to be functioning properly  DVT prophylaxis: Lovenox Code Status: Full Family Communication: Discussed plan of care with the patient's father at bedside. Disposition Plan: Likely discharge home in a.m. Consults called: IR Admission status: Observation  Clydie Braun MD [facility] Pager [phone]   If 7PM-7AM, please contact night-coverage [email or web address] Password [facility]  09/09/2019, 4:31 PM

## 2019-09-09 NOTE — ED Notes (Signed)
Transfer to CT

## 2019-09-09 NOTE — ED Provider Notes (Signed)
[facility] EMERGENCY DEPARTMENT Provider Note   CSN: [id number] Arrival date & time: 09/09/19  1222     History Chief Complaint  Patient presents with  . Feeding Tube Leaking    DEMETRE MONACO is a 34 y.o. male.  DEAVION DOBBS is a 34 y.o. male with a history of recent esophageal perforation, perinatal anoxic brain injury, MR, epilepsy, who presents accompanied by his father with leaking around his G-tube site.  G-tube was placed during surgery on 1/21, yesterday evening when the patient's mother was trying to give meds through the feeding tube she noticed that it was leaking around the site and that one of the stitches had come out from the bumper.  Patient did not seem to be complaining of any pain, but did went some this morning when his mother was trying to give medications through the tube again.  Parents called Dr. Lucilla Lame office who did his esophageal perforation repair and placed the G-tube, and they recommended he come into the ED.  He has not had any fevers, they have not noticed any redness around the G-tube site. No emesis, some constipation over the last few days noted, Dr. Cliffton Asters was aware and was recommending Metamucil.  Patient was seen for surgical follow-up by Dr. Cliffton Asters yesterday before these issues began.  He is scheduled for a follow-up EGD on 2/11 to see if patient can have esophageal stent removed.  Prior today patient has not had any issues with G-tube.  No other aggravating or alleviating factors.  Level V Caveat: nonverbal, MR        Past Medical History:  Diagnosis Date  . Epilepsy ([facility])   . Mental retardation   . Personal history of extracorporeal membrane oxygenation (ECMO)     Patient Active Problem List   Diagnosis Date Noted  . Pneumomediastinum ([facility]) 08/24/2019  . 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 Intellectual disability 02/27/2015  . Autism spectrum 02/27/2015  . Perinatal anoxic-ischemic brain injury 10/26/2012      Past Surgical History:  Procedure Laterality Date  . CHEST TUBE INSERTION Right 08/24/2019   Procedure: CHEST TUBE INSERTION;  Surgeon: Corliss Skains, MD;  [facility];  Service: Thoracic;  Laterality: Right;  . ESOPHAGOGASTRODUODENOSCOPY Right 08/24/2019   Procedure: ESOPHAGOGASTRODUODENOSCOPY (EGD) percutaneous gastrostomy tube placement, esophageal stent placement, right chest tube placement;  Surgeon: Corliss Skains, MD;  [facility];  Service: Thoracic;  Laterality: Right;  . REMOVAL OF GASTROSTOMY TUBE N/A 08/24/2019   Procedure: PERCUTANEOUS INSERTION OF GASTROSTOMY TUBE;  Surgeon: Corliss Skains, MD;  [facility];  Service: Thoracic;  Laterality: N/A;       Family History  Problem Relation Age of Onset  . Ataxia Neg Hx   . Chorea Neg Hx   . Dementia Neg Hx   . Mental retardation Neg Hx   . Migraines Neg Hx   . Multiple sclerosis Neg Hx   . Neurofibromatosis Neg Hx   . Neuropathy Neg Hx   . Parkinsonism Neg Hx   . Seizures Neg Hx   . Stroke Neg Hx     Social History   Tobacco Use  . Smoking status: Never Smoker  . Smokeless tobacco: Never Used  Substance Use Topics  . Alcohol use: No  . Drug use: No    Home Medications Prior to Admission medications   Medication Sig Start Date End Date Taking? Authorizing Provider  acetaminophen (TYLENOL) 160 MG/5ML solution Place 20.3 mLs (650 mg  total) into feeding tube every 6 (six) hours as needed for mild pain or fever. 09/03/19   Barrett, Erin R, PA-C  Amino Acids-Protein Hydrolys (FEEDING SUPPLEMENT, PRO-STAT SUGAR FREE 64,) LIQD Place 30 mLs into feeding tube 2 (two) times daily. 09/03/19   Barrett, Erin R, PA-C  amoxicillin-clavulanate (AUGMENTIN) 875-125 MG tablet Place 1 tablet into feeding tube every 12 (twelve) hours for 7 days. 09/04/19 09/11/19  Barrett, Erin R, PA-C  clonazePAM (KLONOPIN) 1 MG tablet Place 1 tablet (1 mg total) into feeding tube at bedtime as needed (sleep). 09/03/19    Barrett, Erin R, PA-C  cloZAPine (CLOZARIL) 50 MG tablet Place 1 tablet (50 mg total) into feeding tube 3 (three) times daily. 09/03/19   Barrett, Erin R, PA-C  metoprolol tartrate (LOPRESSOR) 25 mg/10 mL SUSP Place 5 mLs (12.5 mg total) into feeding tube 2 (two) times daily. 09/03/19   Barrett, Erin R, PA-C  Nutritional Supplements (FEEDING SUPPLEMENT, OSMOLITE 1.5 CAL,) LIQD Place 1,000 mLs into feeding tube continuous. 09/03/19   Barrett, Erin R, PA-C  OLANZapine (ZYPREXA) 15 MG tablet Place 1 tablet (15 mg total) into feeding tube at bedtime. 09/04/19   Barrett, Erin R, PA-C  OXcarbazepine (TRILEPTAL) 300 MG/5ML suspension Place 10 mLs (600 mg total) into feeding tube 2 (two) times daily. 09/03/19   Barrett, Rae Roam, PA-C    Allergies    Patient has no known allergies.  Review of Systems   Review of Systems  Unable to perform ROS: Patient nonverbal    Physical Exam Updated Vital Signs BP (!) 124/110   Pulse (!) 102   Temp (!) 97.4 F (36.3 C) (Axillary)   Resp 14   SpO2 97%   Physical Exam Vitals and nursing note reviewed.  Constitutional:      General: He is not in acute distress.    Appearance: He is well-developed and normal weight. He is not ill-appearing or diaphoretic.  HENT:     Head: Normocephalic and atraumatic.  Eyes:     General:        Right eye: No discharge.        Left eye: No discharge.  Cardiovascular:     Rate and Rhythm: Normal rate and regular rhythm.     Heart sounds: Normal heart sounds. No murmur. No friction rub.  Pulmonary:     Effort: Pulmonary effort is normal. No respiratory distress.     Breath sounds: Normal breath sounds. No wheezing or rales.  Abdominal:     General: Bowel sounds are normal. There is no distension.     Palpations: Abdomen is soft. There is no mass.     Tenderness: There is no abdominal tenderness. There is no guarding.     Comments: G-tube present with 1 stitch missing from bumper, no surrounding erythema or skin breakdown,  small amount of leaking gastric fluid noted under dressing.  Abdomen is soft, nondistended, nontender to palpation throughout  Musculoskeletal:        General: No deformity.     Cervical back: Neck supple.  Skin:    General: Skin is warm and dry.     Capillary Refill: Capillary refill takes less than 2 seconds.  Neurological:     Mental Status: He is alert.     Coordination: Coordination normal.     Comments: Patient nonverbal at baseline, able to follow commands Moves extremities without ataxia, coordination intact  Psychiatric:        Mood and Affect: Mood normal.  Behavior: Behavior normal.     ED Results / Procedures / Treatments   Labs (all labs ordered are listed, but only abnormal results are displayed) Labs Reviewed  CBC WITH DIFFERENTIAL/PLATELET - Abnormal; Notable for the following components:      Result Value   RBC 4.12 (*)    Hemoglobin 12.5 (*)    HCT 37.9 (*)    All other components within normal limits  BASIC METABOLIC PANEL    EKG None  Radiology DG Chest 2 View  Result Date: 09/08/2019 CLINICAL DATA:  Pneumomediastinum EXAM: CHEST - 2 VIEW COMPARISON:  09/03/2019 FINDINGS: Lungs are clear. No pleural effusion or pneumothorax. Stable cardiomediastinal contours. No pneumomediastinum. Esophageal stent is noted. Residual contrast within the colon. IMPRESSION: Clear lungs.  No significant abnormality. Electronically Signed   By: Macy Mis M.D.   On: 09/08/2019 11:05    Procedures Procedures (including critical care time)  Medications Ordered in ED Medications - No data to display  ED Course  I have reviewed the triage vital signs and the nursing notes.  Pertinent labs & imaging results that were available during my care of the patient were reviewed by me and considered in my medical decision making (see chart for details).  Clinical Course as of Sep 09 1347  Sat Sep 09, 2019  1339 Patient seen by myself as well as PA provider.  Is a  34 year old male with history of intellectual delay, recent esophageal rupture now status post PEG tube placement, pending CT surgery EGD next week for possible esophageal stent removal, presenting to the ED with PEG tube malfunction.  Father reports they haven't been able to flush his PEG tube for the past 24 hours, and that fluid is extravasating around the tube site.  Patient otherwise stable here.  No evidence of overt infection around tube site.  PEG tube was placed approx 2 weeks ago.  Discussed case with CT surgeon who recommends CT abdomen/pelvis to evaluate tube placement and for possible abscess formation.    [MT]    Clinical Course User Index [MT] Wyvonnia Dusky, MD   MDM Rules/Calculators/A&P                     Patient presents with leaking around his G-tube, G-tube was placed just a little over 2 weeks ago on 1/21 in the setting of recent esophageal perforation.  Surgery done with Dr. Kipp Brood.  Patient has not had any vomiting, but has noted some constipation this week.  No redness or skin breakdown around the G-tube site, family did notice that the patient had one of the stitches from the bumper come out.  On arrival he is mildly tachycardic but vitals otherwise normal.  Nursing staff attempted to flush saline and it easily flushes through the tube but then immediately comes out around the bumper, concerning for tube dislodgment.  Given that this is a fairly new G-tube will discuss with on-call CT surgery.  Case discussed with Dr. Orvan Seen with CT surgery who recommends getting a CT scan of the abdomen and pelvis to better assess the G-tube location and to look for any abscess or surrounding issues.  Basic labs and CT ordered and I discussed plan with patient and father who are in agreement.  Lab work is unremarkable, no leukocytosis or electrolyte derangements.  At shift change care signed out to Dr. Yong Channel who will follow up on CT results and discuss them with on-call CT surgeon,  given that tube is  only a few weeks old if this is dislodged may need further evaluation by the CT surgeon.  Final Clinical Impression(s) / ED Diagnoses Final diagnoses:  Malfunction of gastrostomy tube [facility])    Rx / DC Orders ED Discharge Orders    None       Legrand Rams 09/09/19 1522    Terald Sleeper, MD 09/09/19 [id number]

## 2019-09-09 NOTE — ED Notes (Signed)
Dad states patient was doing fine until yest. His feeding tube started leaking  States the home health nurse took the dressing off and one of the sutures had come out. Dad states last feeding was 10pm. States they attempted to give him medication this am states he is pretty sure he got it.

## 2019-09-09 NOTE — ED Provider Notes (Cosign Needed)
Transfer of Care Note I assumed care of Julian Gray on 09/09/2019  Briefly, RINALDO MACQUEEN is a 34 y.o. male who:  Presents with G tube issue  Flushes easily  Leaks around button  Pending CT  CT surgery placed g tube 2 weeks ago, hx of esophageal stricture/perf  The plan includes:  F/u CT scan  Talk with Ct surgery regarding results   Please refer to the original provider's note for additional information regarding the care of [name].  ### Reassessment: I personally reassessed the patient:  Vital Signs:  The most current vitals were  Vitals:   09/09/19 1227  BP: (!) 124/110  Pulse: (!) 102  Resp: 14  Temp: (!) 97.4 F (36.3 C)  SpO2: 97%    Hemodynamics:  The patient is hemodynamically stable. Mental Status:  The patient is Alert  Additional MDM: CT scan was reviewed, the G-tube is currently outside of the stomach, and the soft tissue, could be in the muscle as well.  I spoke with the CT surgeon, Dr. Renaldo Fiddler, who recommends possible IR involvement in the case.  I also spoke with IR who states that they will try to use a wire to place the G tube back in place. Hospitalist will admit. Made NPO, mIVF started. Admitted in stable condition. I explained the course to patient's father who agreed.  The attending physician was present and available for all medical decision making and procedures related to this patient's care.    Jamey Reas, MD 09/09/19 [phone]

## 2019-09-09 NOTE — ED Notes (Signed)
Attempted to flush  G-tube with NS no resistance met however fluid leaks under the button on his abd.

## 2019-09-09 NOTE — ED Triage Notes (Signed)
Per family at bedside, since this morning pts feeding tube has been leaking around the incision site. Family tried flushing it and administering tube feedings this morning but liquid comes out around it. Pt nonverbal at baseline hx of MR. PEG tube placed last month.

## 2019-09-10 ENCOUNTER — Observation Stay (HOSPITAL_COMMUNITY): Payer: Medicare Other

## 2019-09-10 DIAGNOSIS — D649 Anemia, unspecified: Secondary | ICD-10-CM | POA: Diagnosis present

## 2019-09-10 DIAGNOSIS — R000 Tachycardia, unspecified: Secondary | ICD-10-CM | POA: Diagnosis present

## 2019-09-10 DIAGNOSIS — K223 Perforation of esophagus: Secondary | ICD-10-CM | POA: Diagnosis not present

## 2019-09-10 DIAGNOSIS — K9423 Gastrostomy malfunction: Secondary | ICD-10-CM | POA: Diagnosis present

## 2019-09-10 DIAGNOSIS — Z9281 Personal history of extracorporeal membrane oxygenation (ECMO): Secondary | ICD-10-CM | POA: Diagnosis not present

## 2019-09-10 DIAGNOSIS — F79 Unspecified intellectual disabilities: Secondary | ICD-10-CM | POA: Diagnosis present

## 2019-09-10 DIAGNOSIS — R0902 Hypoxemia: Secondary | ICD-10-CM | POA: Diagnosis not present

## 2019-09-10 DIAGNOSIS — Z20822 Contact with and (suspected) exposure to covid-19: Secondary | ICD-10-CM | POA: Diagnosis present

## 2019-09-10 DIAGNOSIS — J189 Pneumonia, unspecified organism: Secondary | ICD-10-CM | POA: Diagnosis not present

## 2019-09-10 DIAGNOSIS — G40909 Epilepsy, unspecified, not intractable, without status epilepticus: Secondary | ICD-10-CM | POA: Diagnosis present

## 2019-09-10 DIAGNOSIS — Y848 Other medical procedures as the cause of abnormal reaction of the patient, or of later complication, without mention of misadventure at the time of the procedure: Secondary | ICD-10-CM | POA: Diagnosis present

## 2019-09-10 DIAGNOSIS — G4700 Insomnia, unspecified: Secondary | ICD-10-CM | POA: Diagnosis present

## 2019-09-10 DIAGNOSIS — K9420 Gastrostomy complication, unspecified: Secondary | ICD-10-CM | POA: Diagnosis not present

## 2019-09-10 HISTORY — PX: IR REPLC GASTRO/COLONIC TUBE PERCUT W/FLUORO: IMG2333

## 2019-09-10 LAB — CBC
HCT: 37.6 % — ABNORMAL LOW (ref 39.0–52.0)
Hemoglobin: 12.3 g/dL — ABNORMAL LOW (ref 13.0–17.0)
MCH: 29.7 pg (ref 26.0–34.0)
MCHC: 32.7 g/dL (ref 30.0–36.0)
MCV: 90.8 fL (ref 80.0–100.0)
Platelets: 391 10*3/uL (ref 150–400)
RBC: 4.14 MIL/uL — ABNORMAL LOW (ref 4.22–5.81)
RDW: 11.9 % (ref 11.5–15.5)
WBC: 6.6 10*3/uL (ref 4.0–10.5)
nRBC: 0 % (ref 0.0–0.2)

## 2019-09-10 LAB — BASIC METABOLIC PANEL
Anion gap: 9 (ref 5–15)
BUN: 20 mg/dL (ref 6–20)
CO2: 27 mmol/L (ref 22–32)
Calcium: 8.7 mg/dL — ABNORMAL LOW (ref 8.9–10.3)
Chloride: 107 mmol/L (ref 98–111)
Creatinine, Ser: 0.86 mg/dL (ref 0.61–1.24)
GFR calc Af Amer: >60 mL/min (ref >60)
GFR calc non Af Amer: >60 mL/min (ref >60)
Glucose, Bld: 116 mg/dL — ABNORMAL HIGH (ref 70–99)
Potassium: 3.6 mmol/L (ref 3.5–5.1)
Sodium: 143 mmol/L (ref 135–145)

## 2019-09-10 LAB — MAGNESIUM: Magnesium: 2.4 mg/dL (ref 1.7–2.4)

## 2019-09-10 MED ORDER — IOHEXOL 300 MG/ML  SOLN
50.0000 mL | Freq: Once | INTRAMUSCULAR | Status: AC | PRN
  Administered 2019-09-10: 5 mL

## 2019-09-10 MED ORDER — LIDOCAINE VISCOUS HCL 2 % MT SOLN
OROMUCOSAL | Status: AC
  Filled 2019-09-10: qty 15

## 2019-09-10 NOTE — Procedures (Signed)
Interventional Radiology Procedure Note  Procedure: Attempt at rescuing a displaced percutaneous gtube.   Findings: There is no tract identified into the stomach with contrast injection.  The contrast collects in subcu space, and exits through the skin defect.    Unable to replace.  The indwelling g tube was removed, with dry dressing placed.   Complications: None  Recommendations:  - dry dressing changes to the skin  Signed,  Yvone Neu. Loreta Ave, DO

## 2019-09-10 NOTE — Progress Notes (Signed)
PROGRESS NOTE    OSIRUS BARDIN  MDY:[id number] DOB: [birth year] DOA: 09/09/2019 PCP: Gaspar Garbe, MD   Brief Narrative:  HPI: Julian Gray is a 34 y.o. male with medical history significant of perinatal anoxic brain injury, mental retardation, and seizure disorder.  He presents with his father who provides history with complaints of leaking around G-tube site since yesterday afternoon.  He had just followed up with Dr. Cliffton Asters before the issue began.  His father notes that he must have dislodged it somehow.  This morning he did complain of some pain around G-tube site this morning. He is scheduled to have a follow-up EGD on 2/11 with evaluation to see if esophageal stent can be removed from recent hospitalization for esophageal perforation with pneumomediastinum where he had to have the PEG tube placed on 1/21.   Denies any significant signs of redness around the G-tube site or any reports of bleeding.  His father denies any recent seizures, but does note that the patient has been constipated  ED Course: Patient was noted to be afebrile and mildly tachycardic and all other vital signs maintained.  Labs relatively unremarkable from patient baseline. Influenza and COVID-19 screening were negative.  CT scan of the abdomen revealed gastrostomy tube outside of the stomach within the subcutaneous soft tissues.  Dr. Cliffton Asters of cardiothoracic surgery was notified, but recommended IR to evaluate.  Plan to keep patient n.p.o. for now and IR we will evaluate in a.m. Patient was placed on D5 -0.45%NaCL with 20 mEq of KCl at 100 mL/h.  TRH called to admit.  Assessment & Plan:   Principal Problem:   Complication of gastrostomy tube (HCC) Active Problems:   Pneumomediastinum (HCC)   Esophageal perforation   Normocytic anemia   Seizure disorder (HCC)   Insomnia   Malfunctioning/dislodged G-tube: ED physician had consulted CT surgery and spoke to Dr. Cliffton Asters who had recommended consulting IR  and admitting under hospital service.  IR was consulted.  They were unable to replace the G-tube and they recommended to reconsult CT surgery.  Spoke to Dr. Vickey Sages, on-call CT surgeon.  I was told that they are getting ready to do emergency CABG and that Dr. Cliffton Asters will take care of this tomorrow morning.  I updated patient's father Hurshell Blocher over the phone.  Esophageal perforation with pneumomediastinum: Patient with recent admission for this and currently has esophageal stent. -Continue outpatient follow-up with cardiothoracic surgery  Normocytic anemia: Hemoglobin 12.5 which appears near patient's baseline.  No reports of bleeding per family. -Continue to monitor  Tachycardia: Acute.    Now resolved.  Continue IV Lopressor.  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  Seizure disorder: Patient on oxcarbazepine 600 mg through G-tube at home.  Unfortunately, this will need to be held until G-tube is fixed.  Insomnia: Patient normally on Klonopin 1 mg nightly. -Substituting Ativan 0.5 -1 mg IV   DVT prophylaxis: Lovenox Code Status: Full code Family Communication: Father present at bedside.  Long discussion about patient's care. Patient is from: Home Disposition Plan: Home Barriers to discharge: G-tube replacement   Estimated body mass index is 29.43 kg/m as calculated from the following:   Height as of 09/08/19: 6' (1.829 m).   Weight as of 09/08/19: 98.4 kg.      Nutritional status:               Consultants:   IR and CT surgery  Procedures:   Failed repositioning of G-tube  Antimicrobials:   None   Subjective: Seen and examined.  Patient with anoxic brain injury.  Father at the bedside.  Patient looks comfortable.  Objective: Vitals:   09/09/19 1655 09/09/19 1802 09/09/19 2117 09/10/19 0448  BP: 138/81 (!) 129/99 134/77 128/82  Pulse: 70 78 72 65  Resp: 14 14 19  19   Temp:  98.4 F (36.9 C) 99.7 F (37.6 C) 98.2 F (36.8 C)  TempSrc:  Axillary Oral Oral  SpO2: 93% 95% 94% 96%    Intake/Output Summary (Last 24 hours) at 09/10/2019 1259 Last data filed at 09/10/2019 0507 Gross per 24 hour  Intake 1161.68 ml  Output --  Net 1161.68 ml   There were no vitals filed for this visit.  Examination:  General exam: Appears calm and comfortable  Respiratory system: Clear to auscultation. Respiratory effort normal. Cardiovascular system: S1 & S2 heard, RRR. No JVD, murmurs, rubs, gallops or clicks. No pedal edema. Gastrointestinal system: Abdomen is nondistended, soft and nontender. No organomegaly or masses felt. Normal bowel sounds heard.  G-tube site clean. Central nervous system: Alert  Extremities: Symmetric 5 x 5 power. Skin: No rashes, lesions or ulcers    Data Reviewed: I have personally reviewed following labs and imaging studies  CBC: Recent Labs  Lab 09/09/19 1335 09/10/19 0316  WBC 5.9 6.6  NEUTROABS 3.5  --   HGB 12.5* 12.3*  HCT 37.9* 37.6*  MCV 92.0 90.8  PLT 341 391   Basic Metabolic Panel: Recent Labs  Lab 09/09/19 1335 09/10/19 0316  NA 145 143  K 3.9 3.6  CL 108 107  CO2 28 27  GLUCOSE 97 116*  BUN 21* 20  CREATININE 0.81 0.86  CALCIUM 8.9 8.7*  MG  --  2.4   GFR: Estimated Creatinine Clearance: 151.2 mL/min (by C-G formula based on SCr of 0.86 mg/dL). Liver Function Tests: No results for input(s): AST, ALT, ALKPHOS, BILITOT, PROT, ALBUMIN in the last 168 hours. No results for input(s): LIPASE, AMYLASE in the last 168 hours. No results for input(s): AMMONIA in the last 168 hours. Coagulation Profile: No results for input(s): INR, PROTIME in the last 168 hours. Cardiac Enzymes: No results for input(s): CKTOTAL, CKMB, CKMBINDEX, TROPONINI in the last 168 hours. BNP (last 3 results) No results for input(s): PROBNP in the last 8760 hours. HbA1C: No results for input(s): HGBA1C in the last 72  hours. CBG: Recent Labs  Lab 09/03/19 1923 09/03/19 2315 09/04/19 0413 09/04/19 0742 09/04/19 1040  GLUCAP 115* 117* 132* 129* 136*   Lipid Profile: No results for input(s): CHOL, HDL, LDLCALC, TRIG, CHOLHDL, LDLDIRECT in the last 72 hours. Thyroid Function Tests: No results for input(s): TSH, T4TOTAL, FREET4, T3FREE, THYROIDAB in the last 72 hours. Anemia Panel: No results for input(s): VITAMINB12, FOLATE, FERRITIN, TIBC, IRON, RETICCTPCT in the last 72 hours. Sepsis Labs: No results for input(s): PROCALCITON, LATICACIDVEN in the last 168 hours.  Recent Results (from the past 240 hour(s))  Culture, blood (Routine X 2) w Reflex to ID Panel     Status: None   Collection Time: 09/01/19  8:10 PM   Specimen: BLOOD LEFT HAND  Result Value Ref Range Status   Specimen Description BLOOD LEFT HAND  Final   Special Requests   Final    BOTTLES DRAWN AEROBIC AND ANAEROBIC Blood Culture adequate volume   Culture   Final    NO GROWTH 5 DAYS Performed at [facility], [address] [place], [place] [address]    Report Status 09/06/2019 FINAL  Final  Culture, blood (Routine X 2) w Reflex to ID Panel     Status: None   Collection Time: 09/01/19  8:14 PM   Specimen: BLOOD  Result Value Ref Range Status   Specimen Description BLOOD LEFT KNUCKLE  Final   Special Requests   Final    BOTTLES DRAWN AEROBIC AND ANAEROBIC Blood Culture adequate volume   Culture   Final    NO GROWTH 5 DAYS Performed at [facility], [address], [place], [place] [address]    Report Status 09/06/2019 FINAL  Final  Urine Culture     Status: None   Collection Time: 09/01/19  8:54 PM   Specimen: Urine, Random  Result Value Ref Range Status   Specimen Description URINE, RANDOM  Final   Special Requests NONE  Final   Culture   Final    NO GROWTH Performed at [facility], [address], [place], [place] [address]    Report Status 09/03/2019 FINAL  Final  Respiratory Panel by RT PCR  (Flu A&B, Covid) - Nasopharyngeal Swab     Status: None   Collection Time: 09/09/19  3:57 PM   Specimen: Nasopharyngeal Swab  Result Value Ref Range Status   SARS Coronavirus 2 by RT PCR NEGATIVE NEGATIVE Final    Comment: (NOTE) SARS-CoV-2 target nucleic acids are NOT DETECTED. The SARS-CoV-2 RNA is generally detectable in upper respiratoy specimens during the acute phase of infection. The lowest concentration of SARS-CoV-2 viral copies this assay can detect is 131 copies/mL. A negative result does not preclude SARS-Cov-2 infection and should not be used as the sole basis for treatment or other patient management decisions. A negative result may occur with  improper specimen collection/handling, submission of specimen other than nasopharyngeal swab, presence of viral mutation(s) within the areas targeted by this assay, and inadequate number of viral copies (<131 copies/mL). A negative result must be combined with clinical observations, patient history, and epidemiological information. The expected result is Negative. Fact Sheet for Patients:  https://www.moore.com/ Fact Sheet for Healthcare Providers:  https://www.young.biz/ This test is not yet ap proved or cleared by the [place] FDA and  has been authorized for detection and/or diagnosis of SARS-CoV-2 by FDA under an Emergency Use Authorization (EUA). This EUA will remain  in effect (meaning this test can be used) for the duration of the COVID-19 declaration under Section 564(b)(1) of the Act, 21 U.S.C. section 360bbb-3(b)(1), unless the authorization is terminated or revoked sooner.    Influenza A by PCR NEGATIVE NEGATIVE Final   Influenza B by PCR NEGATIVE NEGATIVE Final    Comment: (NOTE) The Xpert Xpress SARS-CoV-2/FLU/RSV assay is intended as an aid in  the diagnosis of influenza from Nasopharyngeal swab specimens and  should not be used as a sole basis for treatment. Nasal  washings and  aspirates are unacceptable for Xpert Xpress SARS-CoV-2/FLU/RSV  testing. Fact Sheet for Patients: https://www.moore.com/ Fact Sheet for Healthcare Providers: https://www.young.biz/ This test is not yet approved or cleared by the [place] FDA and  has been authorized for detection and/or diagnosis of SARS-CoV-2 by  FDA under an Emergency Use Authorization (EUA). This EUA will remain  in effect (meaning this test can be used) for the duration of the  Covid-19 declaration under Section 564(b)(1) of the Act, 21  U.S.C. section 360bbb-3(b)(1), unless the authorization is  terminated or revoked. Performed at [facility], [address], [place], [place] [address]       Radiology Studies: CT ABDOMEN PELVIS W CONTRAST  Result Date: 09/09/2019 CLINICAL DATA:  Nausea, vomiting.  Concern for G-tube malfunction. EXAM: CT ABDOMEN AND PELVIS WITH CONTRAST TECHNIQUE: Multidetector CT imaging of the abdomen and pelvis was performed using the standard protocol following bolus administration of intravenous contrast. CONTRAST:  OMNIPAQUE IOHEXOL 300 MG/ML  SOLN COMPARISON:  None FINDINGS: Lower chest: Metallic stent within the visualized distal esophagus. No confluent opacities in the lung bases. No effusions. Hepatobiliary: No focal hepatic abnormality. Gallbladder unremarkable. Pancreas: No focal abnormality or ductal dilatation. Spleen: No focal abnormality.  Normal size. Adrenals/Urinary Tract: No adrenal abnormality. No focal renal abnormality. No stones or hydronephrosis. Urinary bladder is unremarkable. Stomach/Bowel: Gastrostomy tube is outside of the stomach. The retention button is within the subcutaneous soft tissues of the upper abdominal wall along the surface of the left rectus muscle. Stomach, large and small bowel grossly unremarkable. Vascular/Lymphatic: No evidence of aneurysm or adenopathy. Reproductive: No visible focal  abnormality. Other: No free fluid or free air. Musculoskeletal: No acute bony abnormality. IMPRESSION: Gastrostomy tube outside of the stomach within the subcutaneous soft tissues as described above. Otherwise no acute findings in the abdomen or pelvis. Electronically Signed   By: Charlett Nose M.D.   On: 09/09/2019 15:11   IR Replc Gastro/Colonic Tube Percut W/Fluoro  Result Date: 09/10/2019 INDICATION: 34 year old male with a history of percutaneous gastrostomy placement. The gastric tube has become displaced, within the superficial soft tissues on CT performed 09/09/2019 EXAM: GASTROSTOMY CATHETER REPLACEMENT MEDICATIONS: None ANESTHESIA/SEDATION: None CONTRAST:  15mL OMNIPAQUE IOHEXOL 300 MG/ML SOLN - administered into the gastric lumen. FLUOROSCOPY TIME:  Fluoroscopy Time: 0 minutes 54 seconds (16 mGy). COMPLICATIONS: None PROCEDURE: Informed written consent was obtained from the patient and the patient's family after a thorough discussion of the procedural risks, benefits and alternatives. All questions were addressed. Maximal Sterile Barrier Technique was utilized including caps, mask, sterile gowns, sterile gloves, sterile drape, hand hygiene and skin antiseptic. A timeout was performed prior to the initiation of the procedure. The epigastrium was prepped with Betadine in a sterile fashion, and a sterile drape was applied covering the operative field. A sterile gown and sterile gloves were used for the procedure. The indwelling gastric tube was gently injected with contrast, with no contrast visualized to enter the stomach lumen. All the contrast exited immediately through the skin site. We then removed the indwelling gastric tube after cutting the stay sutures. A Christmas tree adapter was then used to attempt to identify a tract into the stomach, gently injecting contrast. Ultimately no tract was identified through the abdominal wall musculature into the stomach. Patient tolerated the procedure well and  remained hemodynamically stable throughout. No complications were encountered and no significant blood loss encountered. IMPRESSION: Unsuccessful attempt at rescue of a percutaneous gastrostomy that has been displaced into the abdominal wall. No tract to the stomach was identified. The nonfunctional gastric tube was removed. Signed, Yvone Neu. Loreta Ave, DO [facility] Radiology Electronically Signed   By: Gilmer Mor D.O.   On: 09/10/2019 12:36    Scheduled Meds: . enoxaparin (LOVENOX) injection  40 mg Subcutaneous Q24H  . lidocaine      . LORazepam  0.5-1 mg Intravenous QHS  . metoprolol tartrate  2.5 mg Intravenous Q6H   Continuous Infusions: . dextrose 5 % and 0.45 % NaCl with KCl 20 mEq/L 100 mL/hr  at 09/10/19 1107     LOS: 0 days   Time spent: 35 minutes   Darliss Cheney, MD [facility]  09/10/2019, 12:59 PM   To contact the attending provider between 7A-7P or the covering provider during after hours 7P-7A, please log into the web site [email or web address].

## 2019-09-10 NOTE — Plan of Care (Signed)

## 2019-09-11 ENCOUNTER — Inpatient Hospital Stay (HOSPITAL_COMMUNITY): Payer: Medicare Other

## 2019-09-11 ENCOUNTER — Inpatient Hospital Stay (HOSPITAL_COMMUNITY): Admission: RE | Admit: 2019-09-11 | Payer: Medicare Other | Source: Ambulatory Visit

## 2019-09-11 ENCOUNTER — Encounter (HOSPITAL_COMMUNITY): Payer: Self-pay | Admitting: Internal Medicine

## 2019-09-11 ENCOUNTER — Encounter (HOSPITAL_COMMUNITY)
Admission: EM | Disposition: A | Discharge status: Home Health | Payer: Self-pay | Source: Home / Self Care | Attending: Family Medicine

## 2019-09-11 ENCOUNTER — Other Ambulatory Visit: Payer: Self-pay | Admitting: *Deleted

## 2019-09-11 ENCOUNTER — Other Ambulatory Visit (HOSPITAL_COMMUNITY): Payer: Medicare Other

## 2019-09-11 DIAGNOSIS — K223 Perforation of esophagus: Secondary | ICD-10-CM

## 2019-09-11 HISTORY — PX: ESOPHAGOGASTRODUODENOSCOPY: SHX5428

## 2019-09-11 HISTORY — PX: VIDEO BRONCHOSCOPY: SHX5072

## 2019-09-11 LAB — PROCALCITONIN: Procalcitonin: 0.45 ng/mL

## 2019-09-11 SURGERY — EGD (ESOPHAGOGASTRODUODENOSCOPY)
Anesthesia: General

## 2019-09-11 MED ORDER — 0.9 % SODIUM CHLORIDE (POUR BTL) OPTIME
TOPICAL | Status: DC | PRN
  Administered 2019-09-11: 11:00:00 1000 mL

## 2019-09-11 MED ORDER — PROMETHAZINE HCL 25 MG/ML IJ SOLN: 6.2500 mg | Freq: Four times a day (QID) | INTRAMUSCULAR | Status: DC | PRN

## 2019-09-11 MED ORDER — ALBUTEROL SULFATE HFA 108 (90 BASE) MCG/ACT IN AERS
INHALATION_SPRAY | RESPIRATORY_TRACT | Status: DC | PRN
  Administered 2019-09-11 (×2): 4 via RESPIRATORY_TRACT

## 2019-09-11 MED ORDER — ONDANSETRON HCL 4 MG/2ML IJ SOLN
INTRAMUSCULAR | Status: AC
  Filled 2019-09-11: qty 2

## 2019-09-11 MED ORDER — OLANZAPINE 5 MG PO TABS
15.0000 mg | ORAL_TABLET | Freq: Every day | ORAL | Status: DC
  Administered 2019-09-11: 22:00:00 15 mg
  Filled 2019-09-11: qty 3
  Filled 2019-09-11: qty 2
  Filled 2019-09-11: qty 3

## 2019-09-11 MED ORDER — OXCARBAZEPINE 300 MG/5ML PO SUSP
600.0000 mg | Freq: Two times a day (BID) | ORAL | Status: DC
  Administered 2019-09-11 – 2019-09-12 (×2): 600 mg
  Filled 2019-09-11 (×3): qty 10

## 2019-09-11 MED ORDER — ACETAMINOPHEN 160 MG/5ML PO SOLN: 1000.0000 mg | Freq: Once | ORAL | Status: DC | PRN

## 2019-09-11 MED ORDER — PROMETHAZINE HCL 25 MG/ML IJ SOLN
6.2500 mg | Freq: Four times a day (QID) | INTRAMUSCULAR | Status: DC | PRN
  Administered 2019-09-11: 14:00:00 6.25 mg via INTRAVENOUS

## 2019-09-11 MED ORDER — METOPROLOL TARTRATE 25 MG/10 ML ORAL SUSPENSION: 12.5000 mg | Freq: Two times a day (BID) | ORAL | Status: DC

## 2019-09-11 MED ORDER — ACETAMINOPHEN 500 MG PO TABS: 1000.0000 mg | ORAL_TABLET | Freq: Once | ORAL | Status: DC | PRN

## 2019-09-11 MED ORDER — CLONAZEPAM 0.5 MG PO TABS: 1.0000 mg | ORAL_TABLET | Freq: Every evening | ORAL | Status: DC | PRN

## 2019-09-11 MED ORDER — PROMETHAZINE HCL 25 MG/ML IJ SOLN
INTRAMUSCULAR | Status: AC
  Filled 2019-09-11: qty 1

## 2019-09-11 MED ORDER — DEXAMETHASONE SODIUM PHOSPHATE 10 MG/ML IJ SOLN
INTRAMUSCULAR | Status: DC | PRN
  Administered 2019-09-11: 10 mg via INTRAVENOUS

## 2019-09-11 MED ORDER — LIDOCAINE 2% (20 MG/ML) 5 ML SYRINGE
INTRAMUSCULAR | Status: DC | PRN
  Administered 2019-09-11: 100 mg via INTRAVENOUS

## 2019-09-11 MED ORDER — CLOZAPINE 25 MG PO TABS
50.0000 mg | ORAL_TABLET | Freq: Three times a day (TID) | ORAL | Status: DC
  Administered 2019-09-11 – 2019-09-12 (×3): 50 mg
  Filled 2019-09-11 (×3): qty 2

## 2019-09-11 MED ORDER — SODIUM CHLORIDE 0.9 % IV SOLN
1.0000 g | INTRAVENOUS | Status: DC
  Administered 2019-09-11: 17:00:00 1 g via INTRAVENOUS
  Filled 2019-09-11: qty 10

## 2019-09-11 MED ORDER — ONDANSETRON HCL 4 MG/2ML IJ SOLN
INTRAMUSCULAR | Status: DC | PRN
  Administered 2019-09-11: 4 mg via INTRAVENOUS

## 2019-09-11 MED ORDER — FENTANYL CITRATE (PF) 250 MCG/5ML IJ SOLN
INTRAMUSCULAR | Status: AC
  Filled 2019-09-11: qty 5

## 2019-09-11 MED ORDER — MIDAZOLAM HCL 2 MG/2ML IJ SOLN
INTRAMUSCULAR | Status: AC
  Filled 2019-09-11: qty 2

## 2019-09-11 MED ORDER — PROPOFOL 10 MG/ML IV BOLUS
INTRAVENOUS | Status: DC | PRN
  Administered 2019-09-11: 200 mg via INTRAVENOUS

## 2019-09-11 MED ORDER — FENTANYL CITRATE (PF) 250 MCG/5ML IJ SOLN
INTRAMUSCULAR | Status: DC | PRN
  Administered 2019-09-11: 100 ug via INTRAVENOUS
  Administered 2019-09-11: 50 ug via INTRAVENOUS

## 2019-09-11 MED ORDER — IOHEXOL 300 MG/ML  SOLN
INTRAMUSCULAR | Status: DC | PRN
  Administered 2019-09-11 (×2): 50 mL via ORAL

## 2019-09-11 MED ORDER — LABETALOL HCL 5 MG/ML IV SOLN
INTRAVENOUS | Status: AC
  Filled 2019-09-11: qty 4

## 2019-09-11 MED ORDER — OXYCODONE HCL 5 MG PO TABS: 5.0000 mg | ORAL_TABLET | Freq: Once | ORAL | Status: DC | PRN

## 2019-09-11 MED ORDER — SUGAMMADEX SODIUM 200 MG/2ML IV SOLN
INTRAVENOUS | Status: DC | PRN
  Administered 2019-09-11: 200 mg via INTRAVENOUS

## 2019-09-11 MED ORDER — ACETAMINOPHEN 10 MG/ML IV SOLN: 1000.0000 mg | Freq: Once | INTRAVENOUS | Status: DC | PRN

## 2019-09-11 MED ORDER — FENTANYL CITRATE (PF) 100 MCG/2ML IJ SOLN: 25.0000 ug | INTRAMUSCULAR | Status: DC | PRN

## 2019-09-11 MED ORDER — LACTATED RINGERS IV SOLN: INTRAVENOUS | Status: DC | PRN

## 2019-09-11 MED ORDER — DEXAMETHASONE SODIUM PHOSPHATE 10 MG/ML IJ SOLN
INTRAMUSCULAR | Status: AC
  Filled 2019-09-11: qty 1

## 2019-09-11 MED ORDER — PROMETHAZINE HCL 25 MG/ML IJ SOLN
6.2500 mg | INTRAMUSCULAR | Status: DC | PRN
  Administered 2019-09-11: 14:00:00 6.25 mg via INTRAVENOUS

## 2019-09-11 MED ORDER — ROCURONIUM BROMIDE 10 MG/ML (PF) SYRINGE
PREFILLED_SYRINGE | INTRAVENOUS | Status: DC | PRN
  Administered 2019-09-11: 60 mg via INTRAVENOUS

## 2019-09-11 MED ORDER — SODIUM CHLORIDE 0.9 % IV SOLN
500.0000 mg | INTRAVENOUS | Status: DC
  Administered 2019-09-11: 19:00:00 500 mg via INTRAVENOUS
  Filled 2019-09-11 (×3): qty 500

## 2019-09-11 MED ORDER — OXYCODONE HCL 5 MG/5ML PO SOLN: 5.0000 mg | Freq: Once | ORAL | Status: DC | PRN

## 2019-09-11 SURGICAL SUPPLY — 32 items
BLADE CLIPPER SURG (BLADE) ×4 IMPLANT
BUTTON OLYMPUS DEFENDO 5 PIECE (MISCELLANEOUS) ×4 IMPLANT
CANISTER SUCT 3000ML PPV (MISCELLANEOUS) ×4 IMPLANT
CNTNR URN SCR LID CUP LEK RST (MISCELLANEOUS) ×2 IMPLANT
CONT SPEC 4OZ STRL OR WHT (MISCELLANEOUS) ×2
COVER BACK TABLE 60X90IN (DRAPES) ×8 IMPLANT
FORCEP RAT TOOTH COOK (INSTRUMENTS) IMPLANT
FORCEPS GRASP COMBO 8X230 (FORCEP) ×4 IMPLANT
GAUZE SPONGE 4X4 12PLY STRL (GAUZE/BANDAGES/DRESSINGS) ×4 IMPLANT
GLOVE BIO SURGEON STRL SZ7 (GLOVE) ×4 IMPLANT
GLOVE BIO SURGEON STRL SZ7.5 (GLOVE) ×4 IMPLANT
GLOVE BIOGEL PI IND STRL 6 (GLOVE) ×2 IMPLANT
GLOVE BIOGEL PI INDICATOR 6 (GLOVE) ×2
GOWN STRL REUS W/ TWL XL LVL3 (GOWN DISPOSABLE) ×2 IMPLANT
GOWN STRL REUS W/TWL XL LVL3 (GOWN DISPOSABLE) ×2
KIT TURNOVER KIT B (KITS) ×4 IMPLANT
MARKER SKIN DUAL TIP RULER LAB (MISCELLANEOUS) ×4 IMPLANT
NS IRRIG 1000ML POUR BTL (IV SOLUTION) ×4 IMPLANT
OIL SILICONE PENTAX (PARTS (SERVICE/REPAIRS)) IMPLANT
PAD ARMBOARD 7.5X6 YLW CONV (MISCELLANEOUS) ×8 IMPLANT
SYR 20ML ECCENTRIC (SYRINGE) ×8 IMPLANT
SYR 30ML SLIP (SYRINGE) ×8 IMPLANT
TOWEL GREEN STERILE (TOWEL DISPOSABLE) ×4 IMPLANT
TOWEL GREEN STERILE FF (TOWEL DISPOSABLE) ×4 IMPLANT
TUBE CONNECTING 20'X1/4 (TUBING) ×1
TUBE CONNECTING 20X1/4 (TUBING) ×3 IMPLANT
TUBING ENDO SMARTCAP (MISCELLANEOUS) ×8 IMPLANT
UNDERPAD 30X30 (UNDERPADS AND DIAPERS) ×4 IMPLANT
VALVE BIOPSY  SINGLE USE (MISCELLANEOUS) ×2
VALVE BIOPSY SINGLE USE (MISCELLANEOUS) ×2 IMPLANT
VALVE SUCTION BRONCHIO DISP (MISCELLANEOUS) ×4 IMPLANT
WATER STERILE IRR 1000ML POUR (IV SOLUTION) ×4 IMPLANT

## 2019-09-11 NOTE — TOC Initial Note (Signed)
Transition of Care [facility]) - Initial/Assessment Note    Patient Details  Name: Julian Gray MRN: [id number] Date of Birth: [birth year]  Transition of Care [facility] CM/SW Contact:    Kingsley Plan, RN Phone Number: 09/11/2019, 10:28 AM  Clinical Narrative:                  Spoke to patient and father Raeqwon Lux at bedside. From home with [facility], and [facility] for tube feedings.   Entered resumption of care orders for [facility], Cory with [facility] aware patient has been admitted to hospital . And Zack with [facility] also aware. Expected Discharge Plan: Home w Home Health Services Barriers to Discharge: Continued Medical Work up(swallow study and G tube placement)   Patient Goals and CMS Choice Patient states their goals for this hospitalization and ongoing recovery are:: to return home CMS Medicare.gov Compare Post Acute Care list provided to:: Legal Guardian(father Leander Rams) Choice offered to / list presented to : Parent  Expected Discharge Plan and Services Expected Discharge Plan: Home w Home Health Services   Discharge Planning Services: CM Consult Post Acute Care Choice: Home Health Living arrangements for the past 2 months: Single Family Home                 DME Arranged: N/A         HH Arranged: RN HH Agency: [facility] Date [facility] Agency Contacted: 09/11/19 Time HH Agency Contacted: 1028 Representative spoke with at [facility] Agency: Kandee Keen  Prior Living Arrangements/Services Living arrangements for the past 2 months: Single Family Home Lives with:: Parents Patient language and need for interpreter reviewed:: Yes Do you feel safe going back to the place where you live?: Yes      Need for Family Participation in Patient Care: Yes (Comment) Care giver support system in place?: Yes (comment)   Criminal Activity/Legal Involvement Pertinent to Current Situation/Hospitalization: No - Comment as needed  Activities of Daily Living      Permission  Sought/Granted      Share Information with NAME: Demetri Goshert     Permission granted to share info w Relationship: father     Emotional Assessment Appearance:: Appears stated age            Admission diagnosis:  Malfunction of gastrostomy tube (HCC) [K94.23] Complication of gastrostomy tube [facility]) [K94.20] Patient Active Problem List   Diagnosis Date Noted  . Complication of gastrostomy tube (HCC) 09/09/2019  . Normocytic anemia 09/09/2019  . Seizure disorder (HCC) 09/09/2019  . Insomnia 09/09/2019  . Pneumomediastinum (HCC) 08/24/2019  . 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 Intellectual disability 02/27/2015  . Autism spectrum 02/27/2015  . Perinatal anoxic-ischemic brain injury 10/26/2012   PCP:  Gaspar Garbe, MD Pharmacy:   [phone] - [place], [place] - [address] AT [address] OF [address] [place] [address] Phone: [phone] Fax: [phone]     Social Determinants of Health (SDOH) Interventions    Readmission Risk Interventions No flowsheet data found.

## 2019-09-11 NOTE — Progress Notes (Addendum)
      [address]       [name] [address]             [phone]      Subjective:  Patient admits to pain.  Points to where is J Tube was.  No family at bedside.  IR was unable to replace G Tube over the weekend.  Objective: Vital signs in last 24 hours: Temp:  [97.6 F (36.4 C)-99.1 F (37.3 C)] 97.6 F (36.4 C) (02/08 0531) Pulse Rate:  [46-64] 46 (02/08 0531) Cardiac Rhythm: Sinus bradycardia;Bundle branch block (02/07 1900) Resp:  [18] 18 (02/08 0531) BP: (112-126)/(74-78) 112/74 (02/08 0531) SpO2:  [94 %-96 %] 96 % (02/08 0531)  Intake/Output from previous day: 02/07 0701 - 02/08 0700 In: 0  Out: 800 [Urine:800]  General appearance: alert, cooperative and no distress Heart: regular rate and rhythm Lungs: clear to auscultation bilaterally Abdomen: soft, + tenderness around J tube site Extremities: extremities normal, atraumatic, no cyanosis or edema Wound: clean, some drainage from J Tube site  Lab Results: Recent Labs    09/09/19 1335 09/10/19 0316  WBC 5.9 6.6  HGB 12.5* 12.3*  HCT 37.9* 37.6*  PLT 341 391   BMET:  Recent Labs    09/09/19 1335 09/10/19 0316  NA 145 143  K 3.9 3.6  CL 108 107  CO2 28 27  GLUCOSE 97 116*  BUN 21* 20  CREATININE 0.81 0.86  CALCIUM 8.9 8.7*    PT/INR: No results for input(s): LABPROT, INR in the last 72 hours. ABG    Component Value Date/Time   PHART 7.433 08/25/2019 0508   HCO3 25.0 08/25/2019 0508   TCO2 26 08/25/2019 0508   ACIDBASEDEF 0.2 08/24/2019 2020   O2SAT 100.0 08/25/2019 0508   CBG (last 3)  No results for input(s): GLUCAP in the last 72 hours.  Assessment/Plan:  1. S/P Esophageal stent placement for esophageal perforation.. Readmitted due to G Tube falling out 2. NPO- continue to not allow oral intake 3. Dispo- patient last seen in our office on 2/5.. he was scheduled to return to hospital on 2/10 for repeat EGD with possible removal of esophageal stent.  G tube has fallen  out unable to replace by IR.Marland Kitchen suspect patient will need repeat G tube placement in OR, can be done at some time as EGD.   Dr. Cliffton Asters will follow up with further recommendations   LOS: 1 day    Lowella Dandy, PA-C  09/11/2019   Agree with above OR today for esophagoscopy, esophageal stent removal, esophagram, and possible PEG tube placement.  Marlee Armenteros Keane Scrape

## 2019-09-11 NOTE — Anesthesia Procedure Notes (Signed)
Procedure Name: Intubation Date/Time: 09/11/2019 12:06 PM Performed by: Val Eagle, MD Pre-anesthesia Checklist: Patient identified, Emergency Drugs available, Suction available and Patient being monitored Patient Re-evaluated:Patient Re-evaluated prior to induction Oxygen Delivery Method: Circle system utilized Preoxygenation: Pre-oxygenation with 100% oxygen Induction Type: IV induction Ventilation: Two handed mask ventilation required, Mask ventilation with difficulty and Oral airway inserted - appropriate to patient size Laryngoscope Size: Glidescope and 4 Grade View: Grade I Tube type: Oral Tube size: 7.5 mm Number of attempts: 1 Airway Equipment and Method: Stylet and Video-laryngoscopy Placement Confirmation: ETT inserted through vocal cords under direct vision,  positive ETCO2 and breath sounds checked- equal and bilateral Secured at: 22 cm Tube secured with: Tape Dental Injury: Teeth and Oropharynx as per pre-operative assessment

## 2019-09-11 NOTE — Anesthesia Preprocedure Evaluation (Signed)
Anesthesia Evaluation  Patient identified by MRN, date of birth, ID band Patient awake    Reviewed: Allergy & Precautions, NPO status , Patient's Chart, lab work & pertinent test results  History of Anesthesia Complications Negative for: history of anesthetic complications  Airway Mallampati: III  TM Distance: >3 FB Neck ROM: Full    Dental  (+) Dental Advisory Given   Pulmonary neg pulmonary ROS,    breath sounds clear to auscultation       Cardiovascular negative cardio ROS   Rhythm:Regular Rate:Normal     Neuro/Psych Seizures -,  PSYCHIATRIC DISORDERS Severe nonverbal autism    GI/Hepatic Neg liver ROS, Esophageal perforation   Endo/Other  negative endocrine ROS  Renal/GU negative Renal ROS     Musculoskeletal   Abdominal   Peds  Hematology  (+) Blood dyscrasia, anemia ,   Anesthesia Other Findings   Reproductive/Obstetrics                             Anesthesia Physical Anesthesia Plan  ASA: III  Anesthesia Plan: General   Post-op Pain Management:    Induction: Intravenous  PONV Risk Score and Plan: 2 and Ondansetron and Dexamethasone  Airway Management Planned: Oral ETT  Additional Equipment: None  Intra-op Plan:   Post-operative Plan: Extubation in OR  Informed Consent: I have reviewed the patients History and Physical, chart, labs and discussed the procedure including the risks, benefits and alternatives for the proposed anesthesia with the patient or authorized representative who has indicated his/her understanding and acceptance.     Dental advisory given  Plan Discussed with: CRNA and Surgeon  Anesthesia Plan Comments:         Anesthesia Quick Evaluation

## 2019-09-11 NOTE — Progress Notes (Signed)
PROGRESS NOTE    Julian Gray  IRC:[id number] DOB: 08-14-1985 DOA: 09/09/2019 PCP: Gaspar Garbe, MD   Brief Narrative:  HPI: Julian Gray is a 34 y.o. male with medical history significant of perinatal anoxic brain injury, mental retardation, and seizure disorder.  He presents with his father who provides history with complaints of leaking around G-tube site since yesterday afternoon.  He had just followed up with Dr. Cliffton Asters before the issue began.  His father notes that he must have dislodged it somehow.  This morning he did complain of some pain around G-tube site this morning. He is scheduled to have a follow-up EGD on 2/11 with evaluation to see if esophageal stent can be removed from recent hospitalization for esophageal perforation with pneumomediastinum where he had to have the PEG tube placed on 1/21.   Denies any significant signs of redness around the G-tube site or any reports of bleeding.  His father denies any recent seizures, but does note that the patient has been constipated  ED Course: Patient was noted to be afebrile and mildly tachycardic and all other vital signs maintained.  Labs relatively unremarkable from patient baseline. Influenza and COVID-19 screening were negative.  CT scan of the abdomen revealed gastrostomy tube outside of the stomach within the subcutaneous soft tissues.  Dr. Cliffton Asters of cardiothoracic surgery was notified, but recommended IR to evaluate.  Plan to keep patient n.p.o. for now and IR we will evaluate in a.m. Patient was placed on D5 -0.45%NaCL with 20 mEq of KCl at 100 mL/h.  TR[facility] called to admit.  Assessment & Plan:   Principal Problem:   Complication of gastrostomy tube (HCC) Active Problems:   Pneumomediastinum (HCC)   Esophageal perforation   Normocytic anemia   Seizure disorder (HCC)   Insomnia   Malfunctioning/dislodged G-tube: ED physician had consulted CT surgery and spoke to Dr. Cliffton Asters who had recommended consulting IR  and admitting under hospital service.  IR was consulted.  They were unable to replace the G-tube and they recommended to reconsult CT surgery.  CT surgery was reconsulted.  Since there was already a plan for him to have esophageal stent removed on Wednesday so they decided to do it while he is here instead of putting another G-tube.  Patient underwent esophageal stent removal in the OR today and esophagogram done which did not show any extravasation.  Initial plan was to hopefully discharge patient home after the procedure but unfortunately patient became hypoxic and the oxygen saturation did not improve so he was sent to ICU.  Hypoxia/possible aspiration pneumonia: Chest x-ray shows bilateral infiltrates raising suspicion for possible bilateral pneumonia.  We will start him on Rocephin and Zithromax and check procalcitonin.  Repeat CBC and chest x-ray tomorrow.  Esophageal perforation with pneumomediastinum: Patient with recent admission for this and currently has esophageal stent.  Esophageal stent was removed today.  Normocytic anemia:  Hemoglobin at baseline.  Monitor.  Tachycardia: Acute.    Now resolved.  Continue IV Lopressor.  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  Seizure disorder: Resume home medications.  Insomnia: Resume home medications.   DVT prophylaxis: Lovenox Code Status: Full code Family Communication: Father present at bedside.   Patient is from: Home Disposition Plan: Home Barriers to discharge: Hypoxia/clinical deterioration.   Estimated body mass index is 29.43 kg/m as calculated from the following:   Height as of 09/08/19: 6' (1.829 m).   Weight as of 09/08/19: 98.4 kg.  Nutritional status:               Consultants:   IR and CT surgery  Procedures:   Failed repositioning of G-tube  Antimicrobials:   None   Subjective: Seen and examined prior to having the procedure done.  Father  was at the bedside.  Patient was comfortable.  Objective: Vitals:   09/11/19 1402 09/11/19 1417 09/11/19 1506 09/11/19 1526  BP: 125/82 134/82  138/80  Pulse: (!) 105 (!) 107 (!) 101 (!) 114  Resp: (!) 27 (!) 24 (!) 21 (!) 24  Temp:  97.7 F (36.5 C) 97.8 F (36.6 C)   TempSrc:      SpO2: 90% 94% 98% (!) 84%    Intake/Output Summary (Last 24 hours) at 09/11/2019 1539 Last data filed at 09/11/2019 1322 Gross per 24 hour  Intake 400 ml  Output 1270 ml  Net -870 ml   There were no vitals filed for this visit.  Examination:  General exam: Appears calm and comfortable  Respiratory system: Clear to auscultation. Respiratory effort normal. Cardiovascular system: S1 & S2 heard, RRR. No JVD, murmurs, rubs, gallops or clicks. No pedal edema. Gastrointestinal system: Abdomen is nondistended, soft and nontender. No organomegaly or masses felt. Normal bowel sounds heard. Central nervous system: Alert and oriented. No focal neurological deficits. Extremities: Symmetric 5 x 5 power. Skin: No rashes, lesions or ulcers.    Data Reviewed: I have personally reviewed following labs and imaging studies  CBC: Recent Labs  Lab 09/09/19 1335 09/10/19 0316  WBC 5.9 6.6  NEUTROABS 3.5  --   HGB 12.5* 12.3*  HCT 37.9* 37.6*  MCV 92.0 90.8  PLT 341 391   Basic Metabolic Panel: Recent Labs  Lab 09/09/19 1335 09/10/19 0316  NA 145 143  K 3.9 3.6  CL 108 107  CO2 28 27  GLUCOSE 97 116*  BUN 21* 20  CREATININE 0.81 0.86  CALCIUM 8.9 8.7*  MG  --  2.4   GFR: Estimated Creatinine Clearance: 151.2 mL/min (by C-G formula based on SCr of 0.86 mg/dL). Liver Function Tests: No results for input(s): AST, ALT, ALKPHOS, BILITOT, PROT, ALBUMIN in the last 168 hours. No results for input(s): LIPASE, AMYLASE in the last 168 hours. No results for input(s): AMMONIA in the last 168 hours. Coagulation Profile: No results for input(s): INR, PROTIME in the last 168 hours. Cardiac Enzymes: No  results for input(s): CKTOTAL, CKMB, CKMBINDEX, TROPONINI in the last 168 hours. BNP (last 3 results) No results for input(s): PROBNP in the last 8760 hours. HbA1C: No results for input(s): HGBA1C in the last 72 hours. CBG: No results for input(s): GLUCAP in the last 168 hours. Lipid Profile: No results for input(s): CHOL, HDL, LDLCALC, TRIG, CHOLHDL, LDLDIRECT in the last 72 hours. Thyroid Function Tests: No results for input(s): TSH, T4TOTAL, FREET4, T3FREE, THYROIDAB in the last 72 hours. Anemia Panel: No results for input(s): VITAMINB12, FOLATE, FERRITIN, TIBC, IRON, RETICCTPCT in the last 72 hours. Sepsis Labs: No results for input(s): PROCALCITON, LATICACIDVEN in the last 168 hours.  Recent Results (from the past 240 hour(s))  Culture, blood (Routine X 2) w Reflex to ID Panel     Status: None   Collection Time: 09/01/19  8:10 PM   Specimen: BLOOD LEFT HAND  Result Value Ref Range Status   Specimen Description BLOOD LEFT HAND  Final   Special Requests   Final    BOTTLES DRAWN AEROBIC AND ANAEROBIC Blood Culture adequate volume  Culture   Final    NO GROWTH 5 DAYS Performed at [facility], [address] [place], [place] [address]    Report Status 09/06/2019 FINAL  Final  Culture, blood (Routine X 2) w Reflex to ID Panel     Status: None   Collection Time: 09/01/19  8:14 PM   Specimen: BLOOD  Result Value Ref Range Status   Specimen Description BLOOD LEFT KNUCKLE  Final   Special Requests   Final    BOTTLES DRAWN AEROBIC AND ANAEROBIC Blood Culture adequate volume   Culture   Final    NO GROWTH 5 DAYS Performed at [facility], [address] [place], [place] [address]    Report Status 09/06/2019 FINAL  Final  Urine Culture     Status: None   Collection Time: 09/01/19  8:54 PM   Specimen: Urine, Random  Result Value Ref Range Status   Specimen Description URINE, RANDOM  Final   Special Requests NONE  Final   Culture   Final    NO GROWTH Performed  at [facility], [address] [place], [place] [address]    Report Status 09/03/2019 FINAL  Final  Respiratory Panel by RT PCR (Flu A&B, Covid) - Nasopharyngeal Swab     Status: None   Collection Time: 09/09/19  3:57 PM   Specimen: Nasopharyngeal Swab  Result Value Ref Range Status   SARS Coronavirus 2 by RT PCR NEGATIVE NEGATIVE Final    Comment: (NOTE) SARS-CoV-2 target nucleic acids are NOT DETECTED. The SARS-CoV-2 RNA is generally detectable in upper respiratoy specimens during the acute phase of infection. The lowest concentration of SARS-CoV-2 viral copies this assay can detect is 131 copies/mL. A negative result does not preclude SARS-Cov-2 infection and should not be used as the sole basis for treatment or other patient management decisions. A negative result may occur with  improper specimen collection/handling, submission of specimen other than nasopharyngeal swab, presence of viral mutation(s) within the areas targeted by this assay, and inadequate number of viral copies (<131 copies/mL). A negative result must be combined with clinical observations, patient history, and epidemiological information. The expected result is Negative. Fact Sheet for Patients:  https://www.moore.com/ Fact Sheet for Healthcare Providers:  https://www.young.biz/ This test is not yet ap proved or cleared by the [place] FDA and  has been authorized for detection and/or diagnosis of SARS-CoV-2 by FDA under an Emergency Use Authorization (EUA). This EUA will remain  in effect (meaning this test can be used) for the duration of the COVID-19 declaration under Section 564(b)(1) of the Act, 21 U.S.C. section 360bbb-3(b)(1), unless the authorization is terminated or revoked sooner.    Influenza A by PCR NEGATIVE NEGATIVE Final   Influenza B by PCR NEGATIVE NEGATIVE Final    Comment: (NOTE) The Xpert Xpress SARS-CoV-2/FLU/RSV assay is intended as an  aid in  the diagnosis of influenza from Nasopharyngeal swab specimens and  should not be used as a sole basis for treatment. Nasal washings and  aspirates are unacceptable for Xpert Xpress SARS-CoV-2/FLU/RSV  testing. Fact Sheet for Patients: https://www.moore.com/ Fact Sheet for Healthcare Providers: https://www.young.biz/ This test is not yet approved or cleared by the [place] FDA and  has been authorized for detection and/or diagnosis of SARS-CoV-2 by  FDA under an Emergency Use Authorization (EUA). This EUA will remain  in effect (meaning this test can be used) for the duration of the  Covid-19 declaration under Section 564(b)(1) of  the Act, 21  U.S.C. section 360bbb-3(b)(1), unless the authorization is  terminated or revoked. Performed at [facility], [place] [address], [place], [place] [address]       Radiology Studies: IR Replc Gastro/Colonic Tube Percut W/Fluoro  Result Date: 09/10/2019 INDICATION: 34 year old male with a history of percutaneous gastrostomy placement. The gastric tube has become displaced, within the superficial soft tissues on CT performed 09/09/2019 EXAM: GASTROSTOMY CATHETER REPLACEMENT MEDICATIONS: None ANESTHESIA/SEDATION: None CONTRAST:  5mL OMNIPAQUE IOHEXOL 300 MG/ML SOLN - administered into the gastric lumen. FLUOROSCOPY TIME:  Fluoroscopy Time: 0 minutes 54 seconds (16 mGy). COMPLICATIONS: None PROCEDURE: Informed written consent was obtained from the patient and the patient's family after a thorough discussion of the procedural risks, benefits and alternatives. All questions were addressed. Maximal Sterile Barrier Technique was utilized including caps, mask, sterile gowns, sterile gloves, sterile drape, hand hygiene and skin antiseptic. A timeout was performed prior to the initiation of the procedure. The epigastrium was prepped with Betadine in a sterile fashion, and a sterile drape was applied covering the  operative field. A sterile gown and sterile gloves were used for the procedure. The indwelling gastric tube was gently injected with contrast, with no contrast visualized to enter the stomach lumen. All the contrast exited immediately through the skin site. We then removed the indwelling gastric tube after cutting the stay sutures. A Christmas tree adapter was then used to attempt to identify a tract into the stomach, gently injecting contrast. Ultimately no tract was identified through the abdominal wall musculature into the stomach. Patient tolerated the procedure well and remained hemodynamically stable throughout. No complications were encountered and no significant blood loss encountered. IMPRESSION: Unsuccessful attempt at rescue of a percutaneous gastrostomy that has been displaced into the abdominal wall. No tract to the stomach was identified. The nonfunctional gastric tube was removed. Signed, Dulcy Fanny. Earleen Newport, DO [facility] Radiology Electronically Signed   By: Corrie Mckusick D.O.   On: 09/10/2019 12:36   DG Chest Portable 1 View  Result Date: 09/11/2019 CLINICAL DATA:  Esophageal leak and repair EXAM: PORTABLE CHEST 1 VIEW COMPARISON:  09/08/2019 FINDINGS: There is an endotracheal tube with the tip projecting 2.1 cm above the carina. There is severe gaseous distension of the stomach which can be seen with esophageal intubation. There is an esophageal stent in unchanged position. There is increased bilateral perihilar, right upper lobe and left lower lobe airspace disease which may reflect atelectasis versus pneumonia. There is no pleural effusion or pneumothorax. The heart and mediastinal contours are unremarkable. There is no acute osseous abnormality. IMPRESSION: 1. Endotracheal tube with the tip projecting 2.1 cm above the carina. Severe gaseous distension of the stomach which can be seen with esophageal intubation. 2. Esophageal stent in unchanged  position. Increased bilateral perihilar, right upper lobe and left lower lobe airspace disease which may reflect atelectasis versus pneumonia. Electronically Signed   By: Kathreen Devoid   On: 09/11/2019 12:47   DG ESOPHAGUS W SINGLE CM (SOL OR THIN BA)  Result Date: 09/11/2019 CLINICAL DATA:  Esophageal perforation due to food impaction treated with placement of a partially covered esophageal stent on 08/25/2019. Esophageal stent removal today. EXAM: ESOPHOGRAM/BARIUM SWALLOW TECHNIQUE: Single contrast examination was performed using 90 cc of Omnipaque 300 diluted with saline. Study was performed in the operating room under the direction of Dr. Kipp Brood. FLUOROSCOPY TIME:  Fluoroscopy Time:  1 minutes and 52 seconds Radiation Exposure Index (if provided by the  fluoroscopic device): NA Number of Acquired Spot Images: 0 COMPARISON:  Portable chest 09/11/2019. Chest CT 08/24/2019 and esophagram 08/28/2019. FINDINGS: Study was performed after removal of the esophageal stent. Endoscope and endotracheal tube are in place. There is a filling defect in the midesophagus involving the right lateral wall. This persisted throughout the examination, although no contrast extravasation into the mediastinum or pleural space was identified. The distal esophagus is patent. No evidence of aspiration. IMPRESSION: 1. Indeterminate mucosal irregularity along the right wall of the midesophagus following esophageal stent removal. Correlate with endoscopic impression. 2. No evidence of esophageal leak. Electronically Signed   By: Carey Bullocks M.D.   On: 09/11/2019 14:16    Scheduled Meds:  clozapine  50 mg Per Tube TID   enoxaparin (LOVENOX) injection  40 mg Subcutaneous Q24H   LORazepam  0.5-1 mg Intravenous QHS   metoprolol tartrate  12.5 mg Per Tube BID   metoprolol tartrate  2.5 mg Intravenous Q6H   OLANZapine  15 mg Per Tube QHS   OXcarbazepine  600 mg Per Tube BID   promethazine       Continuous Infusions:   azithromycin     cefTRIAXone (ROCEPHIN)  IV     dextrose 5 % and 0.45 % NaCl with KCl 20 mEq/L 100 mL/hr at 09/10/19 2040     LOS: 1 day   Time spent: 30-minute   Hughie Closs, MD [facility]  09/11/2019, 3:39 PM   To contact the attending provider between 7A-7P or the covering provider during after hours 7P-7A, please log into the web site [email or web address].

## 2019-09-11 NOTE — Op Note (Signed)
      [address]       [name] [address]             [phone]        09/11/2019  Patient:  Julian Gray Pre-Op Dx: Hx of esophageal perforation   Post-op Dx:  same Procedure: - Esophagogastroscopy - Removal of esophageal stent - Esophagram with Omnipaque   Surgeon and Role:      * Anarie Kalish, Eliezer Lofts, MD - Primary  Anesthesia  general EBL:  Minimal  Blood Administration: none Specimen:  none   Counts: correct   Indications: 34 yo male who presented almost 3 weeks ago with an esophageal perforation due to food impaction.  An esophageal stent was placed at that time.  He was scheduled to have his stent removed later this week, but dislodged his PEG tube.  I elected to evaluate the healing of his esophagus today  Findings: Esophageal stent was removed without difficulty.  There was a mucosal defect present at 10mm from the incisors. Contrast was injected without evidence of extravasation.  Operative Technique: After the risks, benefits and alternatives were thoroughly discussed, the patient was brought to the operative theatre.  Anesthesia was induced. The patient was prepped and draped in normal sterile fashion.  An appropriate surgical pause was performed, and pre-operative antibiotics were dosed accordingly.  The gastroscope was advanced through the oropharynx into the cervical esophagus under direct visualization.  The scope was passed into the stomach.  The scope was then pulled back, and the esophageal stent was grasped and removed.  A mucosal defect was noted at 25 cm from the incisors.  Contrast was injected through the scope.  There was no evidence of extravasation.  Several shots were taken, and the leak appeared sealed.  The contrast was then aspirated, and the gastroscope was then removed.    The patient tolerated the procedure without any immediate complications, and was transferred to the PACU in stable condition.  [name]

## 2019-09-11 NOTE — Progress Notes (Signed)
     [address]       Julian [place] [address]             [phone]       Esophagram showed no leak on my read Ok to start clear liquids. Pt can be discharged home on a full liquid diet, and all meds crushed.  Jayda White Keane Scrape

## 2019-09-11 NOTE — Transfer of Care (Signed)
Immediate Anesthesia Transfer of Care Note  Patient: Julian Gray  Procedure(s) Performed: ESOPHAGOGASTRODUODENOSCOPY (EGD) esophageal stent removal, esophagram.  will need C-arm, and omniview oral contrast (N/A ) Video Bronchoscopy  Patient Location: PACU  Anesthesia Type:General  Level of Consciousness: alert   Airway & Oxygen Therapy: Patient Spontanous Breathing and Patient connected to face mask oxygen  Post-op Assessment: Report given to RN, Post -op Vital signs reviewed and stable and Patient moving all extremities  Post vital signs: Reviewed and stable  Last Vitals:  Vitals Value Taken Time  BP 140/91 09/11/19 1317  Temp 36.3 C 09/11/19 1317  Pulse 115 09/11/19 1319  Resp 36 09/11/19 1324  SpO2 96 % 09/11/19 1319  Vitals shown include unvalidated device data.  Last Pain:  Vitals:   09/11/19 1317  TempSrc:   PainSc: 0-No pain         Complications: No apparent anesthesia complications

## 2019-09-11 NOTE — Plan of Care (Signed)

## 2019-09-12 DIAGNOSIS — K9420 Gastrostomy complication, unspecified: Secondary | ICD-10-CM

## 2019-09-12 LAB — BASIC METABOLIC PANEL
Anion gap: 9 (ref 5–15)
BUN: 12 mg/dL (ref 6–20)
CO2: 27 mmol/L (ref 22–32)
Calcium: 8.6 mg/dL — ABNORMAL LOW (ref 8.9–10.3)
Chloride: 103 mmol/L (ref 98–111)
Creatinine, Ser: 0.72 mg/dL (ref 0.61–1.24)
GFR calc Af Amer: >60 mL/min (ref >60)
GFR calc non Af Amer: >60 mL/min (ref >60)
Glucose, Bld: 127 mg/dL — ABNORMAL HIGH (ref 70–99)
Potassium: 3.9 mmol/L (ref 3.5–5.1)
Sodium: 139 mmol/L (ref 135–145)

## 2019-09-12 LAB — CBC
HCT: 35.7 % — ABNORMAL LOW (ref 39.0–52.0)
Hemoglobin: 11.8 g/dL — ABNORMAL LOW (ref 13.0–17.0)
MCH: 29.7 pg (ref 26.0–34.0)
MCHC: 33.1 g/dL (ref 30.0–36.0)
MCV: 89.9 fL (ref 80.0–100.0)
Platelets: 347 10*3/uL (ref 150–400)
RBC: 3.97 MIL/uL — ABNORMAL LOW (ref 4.22–5.81)
RDW: 11.6 % (ref 11.5–15.5)
WBC: 14.6 10*3/uL — ABNORMAL HIGH (ref 4.0–10.5)
nRBC: 0 % (ref 0.0–0.2)

## 2019-09-12 LAB — GLUCOSE, CAPILLARY: Glucose-Capillary: 101 mg/dL — ABNORMAL HIGH (ref 70–99)

## 2019-09-12 MED ORDER — OXCARBAZEPINE 600 MG PO TABS: 600.0000 mg | ORAL_TABLET | Freq: Two times a day (BID) | ORAL | Status: AC

## 2019-09-12 MED ORDER — METOPROLOL TARTRATE 25 MG/10 ML ORAL SUSPENSION: 12.5000 mg | Freq: Two times a day (BID) | ORAL | 3 refills | Status: DC

## 2019-09-12 MED ORDER — OLANZAPINE 15 MG PO TABS: 15.0000 mg | ORAL_TABLET | Freq: Every day | ORAL | 0 refills | Status: DC

## 2019-09-12 MED ORDER — AMOXICILLIN-POT CLAVULANATE 875-125 MG PO TABS: 1.0000 | ORAL_TABLET | Freq: Two times a day (BID) | ORAL | 0 refills | Status: AC

## 2019-09-12 MED ORDER — CLONAZEPAM 1 MG PO TABS: 1.0000 mg | ORAL_TABLET | Freq: Every evening | ORAL | 0 refills | Status: DC | PRN

## 2019-09-12 MED ORDER — ACETAMINOPHEN 160 MG/5ML PO SOLN: 650.0000 mg | Freq: Four times a day (QID) | ORAL | 0 refills | Status: DC | PRN

## 2019-09-12 MED ORDER — CLOZAPINE 50 MG PO TABS: 50.0000 mg | ORAL_TABLET | Freq: Three times a day (TID) | ORAL | Status: AC

## 2019-09-12 NOTE — Discharge Summary (Signed)
Physician Discharge Summary  Julian Gray RCV:[id number] DOB: [birth year] DOA: 09/09/2019  PCP: Haywood Pao, MD  Admit date: 09/09/2019 Discharge date: 09/12/2019  Admitted From: Home Disposition:  home  Recommendations for Outpatient Follow-up:  1. Outpatient follow-up with cardiothoracic surgery.  Gastrostomy tube removed by IR and esophageal stent removed by cardiothoracic surgery this admission.  Discharged on full liquid diet, pured diet if tolerated. 2. Short course of antibiotics prescribed for possible pneumonia given questionable infiltrate on chest x-ray and mild leukocytosis which could be reactive.    Home Health: No Equipment/Devices: None  Discharge Condition: Stable CODE STATUS: Full code Diet recommendation: Full liquid diet, pured if tolerated  Brief/Interim Summary: 34 year old Caucasian male with PMH of perinatal anoxic brain injury, mental retardation, seizure disorder who presented with complains of leak around his G-tube site.  CT scan of abdomen revealed gastrostomy tube was outside the stomach within the subcutaneous soft tissues and therefore removed by IR.  He was scheduled to follow-up with Dr. Kipp Brood with cardiothoracic surgery on 2/11 to see if his esophageal stent that was placed for esophageal perforation with pneumomediastinum could be removed.  While he was in the hospital, his stent was removed and started on a liquid diet which he tolerated well.  Initial plan was to discharge him after stent removal but following the procedure he became hypoxic requiring oxygen via nasal cannula.  Chest x-ray showed bilateral infiltrates suspicious for atelectasis versus pneumonia.  He was started on azithromycin and Rocephin.  Procalcitonin 0.45.  Noted to have leukocytosis on the subsequent day which could be reactive versus infectious.  He remained hemodynamically stable, was weaned to room air with oxygen saturation 94%.  He was cleared for discharge from CT surgery  standpoint if he tolerated a liquid diet well.  Discussed this plan with patient's father who is in agreement with discharge and CT surgery follow-up.  Discharge Diagnoses:  Principal Problem:   Complication of gastrostomy tube [facility]) Active Problems:   Pneumomediastinum ([place])   Esophageal perforation   Normocytic anemia   Seizure disorder [facility])   Insomnia    Discharge Instructions:  Discharge Instructions    Increase activity slowly   Complete by: As directed      Allergies as of 09/12/2019   No Known Allergies     Medication List    STOP taking these medications   feeding supplement (OSMOLITE 1.5 CAL) Liqd   feeding supplement (PRO-STAT SUGAR FREE 64) Liqd     TAKE these medications   acetaminophen 160 MG/5ML solution Commonly known as: TYLENOL Take 20.3 mLs (650 mg total) by mouth every 6 (six) hours as needed for mild pain or fever. What changed: how to take this   amoxicillin-clavulanate 875-125 MG tablet Commonly known as: AUGMENTIN Place 1 tablet into feeding tube every 12 (twelve) hours for 4 days.   clonazePAM 1 MG tablet Commonly known as: KLONOPIN Take 1 tablet (1 mg total) by mouth at bedtime as needed (sleep). What changed: how to take this   clozapine 50 MG tablet Commonly known as: CLOZARIL Take 1 tablet (50 mg total) by mouth 3 (three) times daily. What changed: how to take this   metoprolol tartrate 25 mg/10 mL Susp Commonly known as: LOPRESSOR Take 5 mLs (12.5 mg total) by mouth 2 (two) times daily. What changed: how to take this   OLANZapine 15 MG tablet Commonly known as: ZYPREXA Take 1 tablet (15 mg total) by mouth at bedtime. What changed: how to take  this   oxcarbazepine 600 MG tablet Commonly known as: TRILEPTAL Take 1 tablet (600 mg total) by mouth 2 (two) times daily. What changed: Another medication with the same name was removed. Continue taking this medication, and follow the directions you see here.       No Known  Allergies  Consultations:  IR, CT surgery   Procedures/Studies: DG Chest 2 View  Result Date: 09/08/2019 CLINICAL DATA:  Pneumomediastinum EXAM: CHEST - 2 VIEW COMPARISON:  09/03/2019 FINDINGS: Lungs are clear. No pleural effusion or pneumothorax. Stable cardiomediastinal contours. No pneumomediastinum. Esophageal stent is noted. Residual contrast within the colon. IMPRESSION: Clear lungs.  No significant abnormality. Electronically Signed   By: Guadlupe SpanishPraneil  Kayleana Waites M.D.   On: 09/08/2019 11:05   CT Chest W Contrast  Result Date: 08/24/2019 CLINICAL DATA:  Choking on food. Hematemesis. Aspiration. Concern for esophageal perforation. EXAM: CT CHEST WITH CONTRAST TECHNIQUE: Multidetector CT imaging of the chest was performed during intravenous contrast administration. CONTRAST:  75mL OMNIPAQUE IOHEXOL 300 MG/ML  SOLN COMPARISON:  Chest radiograph from earlier today. FINDINGS: Cardiovascular: Normal heart size. No significant pericardial effusion/thickening. Great vessels are normal in course and caliber. No central pulmonary emboli. Mediastinum/Nodes: No discrete thyroid nodules. There is severe right eccentric wall thickening throughout the thoracic esophagus with pneumatosis in the right upper thoracic esophageal wall (series 2/image 27). There is pneumomediastinum throughout the high right paraesophageal mediastinum (series 2/image 32). There is ill-defined fluid and fat stranding throughout paraesophageal mediastinum, right greater than left. No pathologically enlarged axillary, mediastinal or hilar lymph nodes. Lungs/Pleura: No pneumothorax. Small dependent right pleural effusion. No left pleural effusion. Endotracheal tube tip is 0.2 cm above the carina. Patchy perihilar consolidation throughout the right lung, most prominent in the right upper lobe, with some associated volume loss. No lung masses or significant pulmonary nodules. Upper abdomen: No acute abnormality. Musculoskeletal: No aggressive  appearing focal osseous lesions. Symmetric mild bilateral gynecomastia. IMPRESSION: 1. Low lying endotracheal tube with tip 0.2 cm above the carina, recommend retracting 1.5-2 cm. 2. Spectrum of findings compatible with acute esophageal perforation in the right upper thoracic esophagus with high right paraesophageal pneumomediastinum, high right esophageal wall pneumatosis, severe right eccentric esophageal wall thickening and evidence of paraesophageal mediastinitis. 3. Small dependent right pleural effusion. 4. Patchy right perihilar lung consolidation with some volume loss, favor a combination of atelectasis and aspiration. These results were called by telephone at the time of interpretation on 08/24/2019 at 7:35 pm to provider PA [facility] ClinicBRANDON MORELLI , who verbally acknowledged these results. Electronically Signed   By: Delbert PhenixJason A Poff M.D.   On: 08/24/2019 19:36   CT ABDOMEN PELVIS W CONTRAST  Result Date: 09/09/2019 CLINICAL DATA:  Nausea, vomiting.  Concern for G-tube malfunction. EXAM: CT ABDOMEN AND PELVIS WITH CONTRAST TECHNIQUE: Multidetector CT imaging of the abdomen and pelvis was performed using the standard protocol following bolus administration of intravenous contrast. CONTRAST:  100mL OMNIPAQUE IOHEXOL 300 MG/ML  SOLN COMPARISON:  None FINDINGS: Lower chest: Metallic stent within the visualized distal esophagus. No confluent opacities in the lung bases. No effusions. Hepatobiliary: No focal hepatic abnormality. Gallbladder unremarkable. Pancreas: No focal abnormality or ductal dilatation. Spleen: No focal abnormality.  Normal size. Adrenals/Urinary Tract: No adrenal abnormality. No focal renal abnormality. No stones or hydronephrosis. Urinary bladder is unremarkable. Stomach/Bowel: Gastrostomy tube is outside of the stomach. The retention button is within the subcutaneous soft tissues of the upper abdominal wall along the surface of the left rectus muscle. Stomach, large and small  bowel grossly  unremarkable. Vascular/Lymphatic: No evidence of aneurysm or adenopathy. Reproductive: No visible focal abnormality. Other: No free fluid or free air. Musculoskeletal: No acute bony abnormality. IMPRESSION: Gastrostomy tube outside of the stomach within the subcutaneous soft tissues as described above. Otherwise no acute findings in the abdomen or pelvis. Electronically Signed   By: Charlett Nose M.D.   On: 09/09/2019 15:11   IR Replc Gastro/Colonic Tube Percut W/Fluoro  Result Date: 09/10/2019 INDICATION: 34 year old male with a history of percutaneous gastrostomy placement. The gastric tube has become displaced, within the superficial soft tissues on CT performed 09/09/2019 EXAM: GASTROSTOMY CATHETER REPLACEMENT MEDICATIONS: None ANESTHESIA/SEDATION: None CONTRAST:  5mL OMNIPAQUE IOHEXOL 300 MG/ML SOLN - administered into the gastric lumen. FLUOROSCOPY TIME:  Fluoroscopy Time: 0 minutes 54 seconds (16 mGy). COMPLICATIONS: None PROCEDURE: Informed written consent was obtained from the patient and the patient's family after a thorough discussion of the procedural risks, benefits and alternatives. All questions were addressed. Maximal Sterile Barrier Technique was utilized including caps, mask, sterile gowns, sterile gloves, sterile drape, hand hygiene and skin antiseptic. A timeout was performed prior to the initiation of the procedure. The epigastrium was prepped with Betadine in a sterile fashion, and a sterile drape was applied covering the operative field. A sterile gown and sterile gloves were used for the procedure. The indwelling gastric tube was gently injected with contrast, with no contrast visualized to enter the stomach lumen. All the contrast exited immediately through the skin site. We then removed the indwelling gastric tube after cutting the stay sutures. A Christmas tree adapter was then used to attempt to identify a tract into the stomach, gently injecting contrast. Ultimately no tract was  identified through the abdominal wall musculature into the stomach. Patient tolerated the procedure well and remained hemodynamically stable throughout. No complications were encountered and no significant blood loss encountered. IMPRESSION: Unsuccessful attempt at rescue of a percutaneous gastrostomy that has been displaced into the abdominal wall. No tract to the stomach was identified. The nonfunctional gastric tube was removed. Signed, Yvone Neu. Loreta Ave, DO [facility] Electronically Signed   By: Gilmer Mor D.O.   On: 09/10/2019 12:36   DG Chest Portable 1 View  Result Date: 09/11/2019 CLINICAL DATA:  Esophageal leak and repair EXAM: PORTABLE CHEST 1 VIEW COMPARISON:  09/08/2019 FINDINGS: There is an endotracheal tube with the tip projecting 2.1 cm above the carina. There is severe gaseous distension of the stomach which can be seen with esophageal intubation. There is an esophageal stent in unchanged position. There is increased bilateral perihilar, right upper lobe and left lower lobe airspace disease which may reflect atelectasis versus pneumonia. There is no pleural effusion or pneumothorax. The heart and mediastinal contours are unremarkable. There is no acute osseous abnormality. IMPRESSION: 1. Endotracheal tube with the tip projecting 2.1 cm above the carina. Severe gaseous distension of the stomach which can be seen with esophageal intubation. 2. Esophageal stent in unchanged position. Increased bilateral perihilar, right upper lobe and left lower lobe airspace disease which may reflect atelectasis versus pneumonia. Electronically Signed   By: Elige Ko   On: 09/11/2019 12:47   DG CHEST PORT 1 VIEW  Result Date: 09/03/2019 CLINICAL DATA:  Choking, esophageal perforation.  Pneumonia. EXAM: PORTABLE CHEST 1 VIEW COMPARISON:  09/01/2019 FINDINGS: Patient is rotated to the right. Lungs are adequately inflated with near resolution of the  previously seen hazy airspace process over the right midlung. Findings suggest resolved/improved interstitial  edema. No evidence of effusion. Cardiomediastinal silhouette and remainder of the exam is unchanged. IMPRESSION: Significant interval improvement/resolution of previously seen right midlung airspace process likely resolved interstitial edema. Electronically Signed   By: Elberta Fortis M.D.   On: 09/03/2019 09:27   DG Chest Port 1 View  Result Date: 09/01/2019 CLINICAL DATA:  Pleural effusion EXAM: PORTABLE CHEST 1 VIEW COMPARISON:  Radiograph 09/01/2019 FINDINGS: Esophageal stent noted in place. There are bilateral mixed interstitial and airspace opacities most focally confluent in the right perihilar region. Lung volumes are low likely with some superimposed atelectatic change. Cardiomediastinal contours are stable from prior accounting for differences in patient rotation. No acute osseous or soft tissue abnormality. Degenerative changes are present in the imaged spine and shoulders. IMPRESSION: Bilateral mixed interstitial and airspace opacities most focally confluent in the right perihilar region. Findings may represent pneumonia or aspiration versus pulmonary edema. No visible effusion although portion of the left costophrenic sulcus is collimated. Electronically Signed   By: Kreg Shropshire M.D.   On: 09/01/2019 20:01   DG CHEST PORT 1 VIEW  Result Date: 09/01/2019 CLINICAL DATA:  Fever. EXAM: PORTABLE CHEST 1 VIEW COMPARISON:  August 29, 2019 FINDINGS: No pneumothorax. Stable cardiomegaly. The hila and mediastinum are normal. And esophageal stent remains in place. No focal infiltrate, nodule, or mass. IMPRESSION: No cause for fever identified. Electronically Signed   By: Gerome Sam III M.D   On: 09/01/2019 17:15   DG CHEST PORT 1 VIEW  Result Date: 08/29/2019 CLINICAL DATA:  Patient status post right chest tube removal. History of esophageal perforation. EXAM: PORTABLE CHEST 1 VIEW  COMPARISON:  Single-view of the chest 08/27/2019. FINDINGS: Right chest tube seen on the prior examination has been removed. No pneumothorax. There is a small right pleural effusions and basilar atelectasis. The left lung is clear. Soft tissue thickening along the right paratracheal space is unchanged. Heart size is normal. Esophageal stent is in place. IMPRESSION: Negative for pneumothorax after right chest tube removal. Small right pleural effusion and basilar atelectasis. Electronically Signed   By: Drusilla Kanner M.D.   On: 08/29/2019 08:43   DG Chest Port 1 View  Result Date: 08/27/2019 CLINICAL DATA:  Encounter for chest tube removal. Esophageal perforation EXAM: PORTABLE CHEST 1 VIEW COMPARISON:  08/24/2019 FINDINGS: Right chest tube remains in place. No pneumothorax. Probable minimal right pleural effusion unchanged. Right paratracheal soft tissue thickening unchanged. Esophageal stent unchanged in position. Mild atelectasis in the lung bases. IMPRESSION: Right chest tube remains in place.  No pneumothorax. Soft tissue thickening right paratracheal region unchanged. Bibasilar atelectasis also unchanged. Electronically Signed   By: Marlan Palau M.D.   On: 08/27/2019 07:16   DG Chest Port 1 View  Result Date: 08/25/2019 CLINICAL DATA:  Pleural effusion. Status post esophageal perforation repaired with a covered stent. EXAM: PORTABLE CHEST 1 VIEW COMPARISON:  One-view chest x-ray 08/24/2019 FINDINGS: Patient remains intubated. Esophageal stent is in place. The heart size is normal. Aeration of both lungs is improving. Right-sided chest tube in place without pneumothorax. Right pleural effusion is again noted. Right-sided airspace opacities are improving. IMPRESSION: 1. Improving aeration of both lungs. 2. Stable right pleural effusion. Electronically Signed   By: Marin Roberts M.D.   On: 08/25/2019 06:56   DG Chest Portable 1 View  Result Date: 08/24/2019 CLINICAL DATA:  Rule out  pneumothorax. EXAM: PORTABLE CHEST 1 VIEW COMPARISON:  August 24, 2019 FINDINGS: There is stable endotracheal tube positioning. A right-sided chest tube is  seen with its distal tip overlying the medial aspect of the upper right lung. This represents a new finding when compared to the prior exam. A radiopaque stent is seen overlying the midline and is likely located throughout the length of the esophagus. Moderate to marked severity infiltrate is seen throughout the right apex and medial aspect of the right lung. No pneumothorax is identified. The cardiac silhouette is mildly enlarged. The visualized skeletal structures are unremarkable. IMPRESSION: 1. Interval right sided chest tube placement and positioning, as described above, when compared to the prior chest plain film dated August 24, 2019. 2. No evidence of residual pneumothorax. 3. Interval esophageal stent placement since the prior study. 4. Interval development of moderate to marked severity right-sided infiltrate since the prior exam. Electronically Signed   By: Aram Candela M.D.   On: 08/24/2019 23:59   DG Chest Portable 1 View  Result Date: 08/24/2019 CLINICAL DATA:  Endotracheal tube placement. EXAM: PORTABLE CHEST 1 VIEW COMPARISON:  08/24/2019 at 5:51 p.m. FINDINGS: Endotracheal tube tip projects 1.5 cm above the carina. Cardiac silhouette is normal in size. No mediastinal or hilar masses. Possible pneumomediastinum suggested on the earlier exam is not evident on the current study. Hazy perihilar opacities noted on the prior study of are less evident. No new lung abnormalities. No pneumothorax. IMPRESSION: 1. Well-positioned endotracheal tube. 2. Mild improvement in perihilar airspace opacities noted on the prior study. There is no current evidence of pneumomediastinum. Electronically Signed   By: Amie Portland M.D.   On: 08/24/2019 19:20   DG Chest Portable 1 View  Result Date: 08/24/2019 CLINICAL DATA:  Choked on apple.  Hypoxia. EXAM:  PORTABLE CHEST 1 VIEW COMPARISON:  None. FINDINGS: Low lung volumes. Normal heart size. Possible pneumomediastinum in the high left mediastinum. No pneumothorax. No pleural effusion. Diffuse hazy parahilar opacity in both lungs. IMPRESSION: 1. Low lung volumes. Diffuse hazy parahilar opacity in both lungs, which could represent aspiration or atelectasis. 2. Possible pneumomediastinum in the high left mediastinum. Consider chest CT with IV and oral contrast for further evaluation. Electronically Signed   By: Delbert Phenix M.D.   On: 08/24/2019 18:16   DG C-Arm 1-60 Min-No Report  Result Date: 08/24/2019 Fluoroscopy was utilized by the requesting physician.  No radiographic interpretation.   VAS [place] UPPER EXTREMITY VENOUS DUPLEX  Result Date: 08/30/2019 UPPER VENOUS STUDY  Indications: Swelling Limitations: Body habitus and poor patient position. History of anoxic brain injury. Comparison Study: No prior study. Performing Technologist: Gertie Fey MHA, RDMS, RVT, RDCS  Examination Guidelines: A complete evaluation includes B-mode imaging, spectral Doppler, color Doppler, and power Doppler as needed of all accessible portions of each vessel. Bilateral testing is considered an integral part of a complete examination. Limited examinations for reoccurring indications may be performed as noted.  Right Findings: +----------+------------+---------+-----------+----------+--------------+ RIGHT     CompressiblePhasicitySpontaneousProperties   Summary     +----------+------------+---------+-----------+----------+--------------+ IJV                                                 Not visualized +----------+------------+---------+-----------+----------+--------------+ Subclavian    Full       Yes       Yes                             +----------+------------+---------+-----------+----------+--------------+ Axillary  Full       Yes       Yes                              +----------+------------+---------+-----------+----------+--------------+ Brachial      Full       Yes       Yes                             +----------+------------+---------+-----------+----------+--------------+ Radial        Full                                                 +----------+------------+---------+-----------+----------+--------------+ Ulnar         Full                                                 +----------+------------+---------+-----------+----------+--------------+ Cephalic      None                                      Acute      +----------+------------+---------+-----------+----------+--------------+ Basilic       Full                                                 +----------+------------+---------+-----------+----------+--------------+ Right cephalic vein exhibits thrombus from mid upper arm to below AC fossa.  Left Findings: +----------+------------+---------+-----------+----------+--------------+ LEFT      CompressiblePhasicitySpontaneousProperties   Summary     +----------+------------+---------+-----------+----------+--------------+ Subclavian                                          Not visualized +----------+------------+---------+-----------+----------+--------------+  Summary:  Right: No evidence of deep vein thrombosis in the upper extremity. Findings consistent with acute superficial vein thrombosis involving the right cephalic vein.  Left: No evidence of thrombosis in the subclavian.  *See table(s) above for measurements and observations.  Diagnosing physician: Coral Else MD Electronically signed by Coral Else MD on 08/30/2019 at 2:58:39 PM.    Final    DG ESOPHAGUS W SINGLE CM (SOL OR THIN BA)  Result Date: 09/11/2019 CLINICAL DATA:  Esophageal perforation due to food impaction treated with placement of a partially covered esophageal stent on 08/25/2019. Esophageal stent removal today. EXAM: ESOPHOGRAM/BARIUM  SWALLOW TECHNIQUE: Single contrast examination was performed using 90 cc of Omnipaque 300 diluted with saline. Study was performed in the operating room under the direction of Dr. Cliffton Asters. FLUOROSCOPY TIME:  Fluoroscopy Time:  1 minutes and 52 seconds Radiation Exposure Index (if provided by the fluoroscopic device): NA Number of Acquired Spot Images: 0 COMPARISON:  Portable chest 09/11/2019. Chest CT 08/24/2019 and esophagram 08/28/2019. FINDINGS: Study was performed after removal of the esophageal stent. Endoscope and endotracheal tube are in place. There is a filling defect in the midesophagus involving the right lateral  wall. This persisted throughout the examination, although no contrast extravasation into the mediastinum or pleural space was identified. The distal esophagus is patent. No evidence of aspiration. IMPRESSION: 1. Indeterminate mucosal irregularity along the right wall of the midesophagus following esophageal stent removal. Correlate with endoscopic impression. 2. No evidence of esophageal leak. Electronically Signed   By: Carey BullocksWilliam  Veazey M.D.   On: 09/11/2019 14:16   DG ESOPHAGUS W SINGLE CM (SOL OR THIN BA)  Result Date: 08/28/2019 CLINICAL DATA:  Esophageal perforation due to food impaction treated with placement of a partially covered esophageal stent on 08/25/2019 EXAM: ESOPHOGRAM/BARIUM SWALLOW TECHNIQUE: Single contrast examination was performed using water-soluble contrast (Omnipaque 300). FLUOROSCOPY TIME:  Fluoroscopy Time: 1 minutes and 12 seconds of low-dose pulsed fluoroscopy Radiation Exposure Index (if provided by the fluoroscopic device): 19.2 mGy Number of Acquired Spot Images: 2 COMPARISON:  Chest CT 08/24/2019. Chest radiographs 08/27/2019 and 08/24/2019. FINDINGS: The patient has limited mobility, in part secondary to a right-sided chest tube. Study was performed in the supine, semi erect and semi oblique positions. Before administration of contrast, fluoroscopy  demonstrates the esophageal stent in stable position compared with yesterday's radiographs. Compared with the radiographs of 08/24/2019, the proximal end of the stent has slightly migrated distally. The distal end of the stent is at the GE junction, not significantly changed. The patient swallowed the contrast without difficulty. Contrast flows through and around the left side of the esophageal stent. The esophageal lumen at the level of the stent is not excluded by the stent. However, no mucosal irregularity or esophageal leak is identified. No contrast material is seen within the pleural space. IMPRESSION: 1. Distal migration of the proximal end of the esophageal stent compared with the original post stent placement radiographs, likely related to stent expansion. The esophageal lumen is not excluded by the stent proximally. 2. No evidence of residual esophageal leak. Electronically Signed   By: Carey BullocksWilliam  Veazey M.D.   On: 08/28/2019 10:38      Subjective: Awake, alert.  Not in acute distress.  Nonverbal at baseline.  Plan of care with father.  Discharge Exam: Vitals:   09/12/19 0430 09/12/19 1145  BP: 93/60 105/74  Pulse: 83 96  Resp:  18  Temp:  98.7 F (37.1 C)  SpO2:  95%   Vitals:   09/11/19 2328 09/12/19 0335 09/12/19 0430 09/12/19 1145  BP: 116/64 99/64 93/60  105/74  Pulse: 89 65 83 96  Resp: 14 20  18   Temp: 99.4 F (37.4 C)   98.7 F (37.1 C)  TempSrc: Oral Oral  Axillary  SpO2: 98% 96%  95%  Weight:      Height:        General: Pt is alert, awake, not in acute distress, nonverbal at baseline Cardiovascular: RRR, S1/S2 + Respiratory:  no wheezing, no rhonchi, occasional crackles present Abdominal: Soft, NT, ND, bowel sounds + Extremities: no edema, no cyanosis   The results of significant diagnostics from this hospitalization (including imaging, microbiology, ancillary and laboratory) are listed below for reference.     Microbiology: Recent Results (from the past  240 hour(s))  Respiratory Panel by RT PCR (Flu A&B, Covid) - Nasopharyngeal Swab     Status: None   Collection Time: 09/09/19  3:57 PM   Specimen: Nasopharyngeal Swab  Result Value Ref Range Status   SARS Coronavirus 2 by RT PCR NEGATIVE NEGATIVE Final    Comment: (NOTE) SARS-CoV-2 target nucleic acids are NOT DETECTED. The SARS-CoV-2 RNA is generally  detectable in upper respiratoy specimens during the acute phase of infection. The lowest concentration of SARS-CoV-2 viral copies this assay can detect is 131 copies/mL. A negative result does not preclude SARS-Cov-2 infection and should not be used as the sole basis for treatment or other patient management decisions. A negative result may occur with  improper specimen collection/handling, submission of specimen other than nasopharyngeal swab, presence of viral mutation(s) within the areas targeted by this assay, and inadequate number of viral copies (<131 copies/mL). A negative result must be combined with clinical observations, patient history, and epidemiological information. The expected result is Negative. Fact Sheet for Patients:  https://www.moore.com/ Fact Sheet for Healthcare Providers:  https://www.young.biz/ This test is not yet ap proved or cleared by the [place] FDA and  has been authorized for detection and/or diagnosis of SARS-CoV-2 by FDA under an Emergency Use Authorization (EUA). This EUA will remain  in effect (meaning this test can be used) for the duration of the COVID-19 declaration under Section 564(b)(1) of the Act, 21 U.S.C. section 360bbb-3(b)(1), unless the authorization is terminated or revoked sooner.    Influenza A by PCR NEGATIVE NEGATIVE Final   Influenza B by PCR NEGATIVE NEGATIVE Final    Comment: (NOTE) The Xpert Xpress SARS-CoV-2/FLU/RSV assay is intended as an aid in  the diagnosis of influenza from Nasopharyngeal swab specimens and  should not be used  as a sole basis for treatment. Nasal washings and  aspirates are unacceptable for Xpert Xpress SARS-CoV-2/FLU/RSV  testing. Fact Sheet for Patients: https://www.moore.com/ Fact Sheet for Healthcare Providers: https://www.young.biz/ This test is not yet approved or cleared by the [place] FDA and  has been authorized for detection and/or diagnosis of SARS-CoV-2 by  FDA under an Emergency Use Authorization (EUA). This EUA will remain  in effect (meaning this test can be used) for the duration of the  Covid-19 declaration under Section 564(b)(1) of the Act, 21  U.S.C. section 360bbb-3(b)(1), unless the authorization is  terminated or revoked. Performed at [facility], [address], [place], [place] [address]      Labs: BNP (last 3 results) No results for input(s): BNP in the last 8760 hours. Basic Metabolic Panel: Recent Labs  Lab 09/09/19 1335 09/10/19 0316 09/12/19 0235  NA 145 143 139  K 3.9 3.6 3.9  CL 108 107 103  CO2 28 27 27   GLUCOSE 97 116* 127*  BUN 21* 20 12  CREATININE 0.81 0.86 0.72  CALCIUM 8.9 8.7* 8.6*  MG  --  2.4  --    Liver Function Tests: No results for input(s): AST, ALT, ALKPHOS, BILITOT, PROT, ALBUMIN in the last 168 hours. No results for input(s): LIPASE, AMYLASE in the last 168 hours. No results for input(s): AMMONIA in the last 168 hours. CBC: Recent Labs  Lab 09/09/19 1335 09/10/19 0316 09/12/19 0235  WBC 5.9 6.6 14.6*  NEUTROABS 3.5  --   --   HGB 12.5* 12.3* 11.8*  HCT 37.9* 37.6* 35.7*  MCV 92.0 90.8 89.9  PLT 341 391 347   Cardiac Enzymes: No results for input(s): CKTOTAL, CKMB, CKMBINDEX, TROPONINI in the last 168 hours. BNP: Invalid input(s): POCBNP CBG: Recent Labs  Lab 09/12/19 0636  GLUCAP 101*   D-Dimer No results for input(s): DDIMER in the last 72 hours. Hgb A1c No results for input(s): HGBA1C in the last 72 hours. Lipid Profile No results for input(s): CHOL,  HDL, LDLCALC, TRIG, CHOLHDL, LDLDIRECT in the last 72 hours. Thyroid function studies  No results for input(s): TSH, T4TOTAL, T3FREE, THYROIDAB in the last 72 hours.  Invalid input(s): FREET3 Anemia work up No results for input(s): VITAMINB12, FOLATE, FERRITIN, TIBC, IRON, RETICCTPCT in the last 72 hours. Urinalysis    Component Value Date/Time   COLORURINE YELLOW 09/01/2019 2054   APPEARANCEUR CLOUDY (A) 09/01/2019 2054   LABSPEC 1.024 09/01/2019 2054   PHURINE 8.0 09/01/2019 2054   GLUCOSEU NEGATIVE 09/01/2019 2054   HGBUR NEGATIVE 09/01/2019 2054   BILIRUBINUR NEGATIVE 09/01/2019 2054   KETONESUR NEGATIVE 09/01/2019 2054   PROTEINUR NEGATIVE 09/01/2019 2054   NITRITE NEGATIVE 09/01/2019 2054   LEUKOCYTESUR NEGATIVE 09/01/2019 2054   Sepsis Labs Invalid input(s): PROCALCITONIN,  WBC,  LACTICIDVEN Microbiology Recent Results (from the past 240 hour(s))  Respiratory Panel by RT PCR (Flu A&B, Covid) - Nasopharyngeal Swab     Status: None   Collection Time: 09/09/19  3:57 PM   Specimen: Nasopharyngeal Swab  Result Value Ref Range Status   SARS Coronavirus 2 by RT PCR NEGATIVE NEGATIVE Final    Comment: (NOTE) SARS-CoV-2 target nucleic acids are NOT DETECTED. The SARS-CoV-2 RNA is generally detectable in upper respiratoy specimens during the acute phase of infection. The lowest concentration of SARS-CoV-2 viral copies this assay can detect is 131 copies/mL. A negative result does not preclude SARS-Cov-2 infection and should not be used as the sole basis for treatment or other patient management decisions. A negative result may occur with  improper specimen collection/handling, submission of specimen other than nasopharyngeal swab, presence of viral mutation(s) within the areas targeted by this assay, and inadequate number of viral copies (<131 copies/mL). A negative result must be combined with clinical observations, patient history, and epidemiological information.  The expected result is Negative. Fact Sheet for Patients:  https://www.moore.com/ Fact Sheet for Healthcare Providers:  https://www.young.biz/ This test is not yet ap proved or cleared by the [place] FDA and  has been authorized for detection and/or diagnosis of SARS-CoV-2 by FDA under an Emergency Use Authorization (EUA). This EUA will remain  in effect (meaning this test can be used) for the duration of the COVID-19 declaration under Section 564(b)(1) of the Act, 21 U.S.C. section 360bbb-3(b)(1), unless the authorization is terminated or revoked sooner.    Influenza A by PCR NEGATIVE NEGATIVE Final   Influenza B by PCR NEGATIVE NEGATIVE Final    Comment: (NOTE) The Xpert Xpress SARS-CoV-2/FLU/RSV assay is intended as an aid in  the diagnosis of influenza from Nasopharyngeal swab specimens and  should not be used as a sole basis for treatment. Nasal washings and  aspirates are unacceptable for Xpert Xpress SARS-CoV-2/FLU/RSV  testing. Fact Sheet for Patients: https://www.moore.com/ Fact Sheet for Healthcare Providers: https://www.young.biz/ This test is not yet approved or cleared by the [place] FDA and  has been authorized for detection and/or diagnosis of SARS-CoV-2 by  FDA under an Emergency Use Authorization (EUA). This EUA will remain  in effect (meaning this test can be used) for the duration of the  Covid-19 declaration under Section 564(b)(1) of the Act, 21  U.S.C. section 360bbb-3(b)(1), unless the authorization is  terminated or revoked. Performed at [facility], [address] [place], [place] [address]      Time coordinating discharge: Over 30 minutes  SIGNED:   Liborio Nixon, MD  [facility] 09/12/2019, 2:37 PM  If 7PM-7AM, please contact night-coverage

## 2019-09-12 NOTE — Progress Notes (Signed)
RN provided verbal d/c instructions to father Yassen Kinnett. Father at bedside during d/c. Patient is non-verbal. Provided copy of d/c instructions. Pt VSS at discharge. IV removed per orders. Pt dressed by tech. RN and tech d/c patient via wheelchair to [employer] entrance to private vehicle.

## 2019-09-12 NOTE — Progress Notes (Addendum)
      [address]       [name] [address]             [phone]      1 Day Post-Op Procedure(s) (LRB): ESOPHAGOGASTRODUODENOSCOPY (EGD) esophageal stent removal, esophagram.  will need C-arm, and omniview oral contrast (N/A) Video Bronchoscopy   Subjective:  Patient awoken from sleep.  Denies pain.  States he didn't eat yesterday.  No family at bedside.  Objective: Vital signs in last 24 hours: Temp:  [97.3 F (36.3 C)-99.4 F (37.4 C)] 99.4 F (37.4 C) (02/08 2328) Pulse Rate:  [65-121] 83 (02/09 0430) Cardiac Rhythm: Normal sinus rhythm;Bundle branch block (02/09 0430) Resp:  [14-35] 20 (02/09 0335) BP: (93-141)/(60-94) 93/60 (02/09 0430) SpO2:  [84 %-100 %] 96 % (02/09 0335) Weight:  [98 kg] 98 kg (02/08 1526)  Intake/Output from previous day: 02/08 0701 - 02/09 0700 In: 2868.7 [P.O.:360; I.V.:2158.7; IV Piggyback:350] Out: 595 [Urine:575; Blood:20]  General appearance: alert, cooperative and no distress Heart: regular rate and rhythm Lungs: clear to auscultation bilaterally Abdomen: soft, non-tender; bowel sounds normal; no masses,  no organomegaly Extremities: extremities normal, atraumatic, no cyanosis or edema Wound: clean and dry  Lab Results: Recent Labs    09/10/19 0316 09/12/19 0235  WBC 6.6 14.6*  HGB 12.3* 11.8*  HCT 37.6* 35.7*  PLT 391 347   BMET:  Recent Labs    09/10/19 0316 09/12/19 0235  NA 143 139  K 3.6 3.9  CL 107 103  CO2 27 27  GLUCOSE 116* 127*  BUN 20 12  CREATININE 0.86 0.72  CALCIUM 8.7* 8.6*    PT/INR: No results for input(s): LABPROT, INR in the last 72 hours. ABG    Component Value Date/Time   PHART 7.433 08/25/2019 0508   HCO3 25.0 08/25/2019 0508   TCO2 26 08/25/2019 0508   ACIDBASEDEF 0.2 08/24/2019 2020   O2SAT 100.0 08/25/2019 0508   CBG (last 3)  Recent Labs    09/12/19 0636  GLUCAP 101*    Assessment/Plan: S/P Procedure(s) (LRB): ESOPHAGOGASTRODUODENOSCOPY (EGD) esophageal  stent removal, esophagram.  will need C-arm, and omniview oral contrast (N/A) Video Bronchoscopy   1. EGD yesterday showed no leak-- continue liquid diet, can progress to full liquids as tolerated... Once tolerating liquids, okay to discharge home from our standpoint... will arrange office follow-up     LOS: 2 days    Lowella Dandy, PA-C  09/12/2019   Agree with above Afebrile WBC up slightly Ok for full liquids Can discharge on pureed diet  Will follow-up in clinic in 2 weeks  Minta Fair Keane Scrape

## 2019-09-12 NOTE — TOC Transition Note (Signed)
Transition of Care [facility]) - CM/SW Discharge Note   Patient Details  Name: Julian Gray MRN: [id number] Date of Birth: [birth year]  Transition of Care [facility]) CM/SW Contact:  Leone Haven, RN Phone Number: 09/12/2019, 3:24 PM   Clinical Narrative:    Patient is for dc today, notified Cory with [facility] that patient is for dc today , he is active with HHR[facility] .   Final next level of care: Home w Home Health Services Barriers to Discharge: No Barriers Identified   Patient Goals and CMS Choice Patient states their goals for this hospitalization and ongoing recovery are:: to return home CMS Medicare.gov Compare Post Acute Care list provided to:: Legal Guardian(father Leander Rams) Choice offered to / list presented to : Parent  Discharge Placement                       Discharge Plan and Services   Discharge Planning Services: CM Consult Post Acute Care Choice: Home Health          DME Arranged: N/A         HH Arranged: RN HH Agency: [facility] Date [facility] Agency Contacted: 09/12/19 Time HH Agency Contacted: 1524 Representative spoke with at [facility] Agency: Kandee Keen  Social Determinants of Health (SDOH) Interventions     Readmission Risk Interventions No flowsheet data found.

## 2019-09-13 ENCOUNTER — Inpatient Hospital Stay: Admit: 2019-09-13 | Payer: Medicare Other | Admitting: Thoracic Surgery (Cardiothoracic Vascular Surgery)

## 2019-09-13 SURGERY — EGD (ESOPHAGOGASTRODUODENOSCOPY)
Anesthesia: General

## 2019-09-14 NOTE — Anesthesia Postprocedure Evaluation (Signed)
Anesthesia Post Note  Patient: Julian Gray  Procedure(s) Performed: ESOPHAGOGASTRODUODENOSCOPY (EGD) esophageal stent removal, esophagram.  will need C-arm, and omniview oral contrast (N/A ) Video Bronchoscopy     Anesthesia Post Evaluation  Last Vitals:  Vitals:   09/12/19 0430 09/12/19 1145  BP: 93/60 105/74  Pulse: 83 96  Resp:  18  Temp:  37.1 C  SpO2:  95%    Last Pain:  Vitals:   09/12/19 1145  TempSrc: Axillary  PainSc:                  Ivyana Locey

## 2019-09-29 ENCOUNTER — Ambulatory Visit (INDEPENDENT_AMBULATORY_CARE_PROVIDER_SITE_OTHER): Payer: Medicare Other | Admitting: Thoracic Surgery (Cardiothoracic Vascular Surgery)

## 2019-09-29 ENCOUNTER — Encounter: Payer: Self-pay | Admitting: Thoracic Surgery (Cardiothoracic Vascular Surgery)

## 2019-09-29 ENCOUNTER — Other Ambulatory Visit: Payer: Self-pay

## 2019-09-29 VITALS — BP 145/98 | HR 100 | Temp 97.6°F | Resp 20 | Ht 72.0 in | Wt 215.0 lb

## 2019-09-29 DIAGNOSIS — T17908D Unspecified foreign body in respiratory tract, part unspecified causing other injury, subsequent encounter: Secondary | ICD-10-CM

## 2019-09-29 DIAGNOSIS — K223 Perforation of esophagus: Secondary | ICD-10-CM | POA: Diagnosis not present

## 2019-09-29 DIAGNOSIS — Z09 Encounter for follow-up examination after completed treatment for conditions other than malignant neoplasm: Secondary | ICD-10-CM | POA: Diagnosis not present

## 2019-09-29 NOTE — Progress Notes (Signed)
      [address]       [place] [address]             [phone]        [name] [facility] Medical Record #[id number] Date of Birth: 12-07-1985  Referring: Gaspar Garbe, MD Primary Care: Tisovec, Adelfa Koh, MD Primary Cardiologist:No primary care provider on file.  Reason for visit:   follow-up  History of Present Illness:     Julian Gray comes in today for his follow-up appointment.  He has done well with diet although he does appear to still be aspirating.  When he drinks too fast he has short coughing spells.  He denies any fevers, and it does not appear to be painful with swallowing.  Physical Exam: BP (!) 145/98   Pulse 100   Temp 97.6 F (36.4 C) (Skin)   Resp 20   Ht 6' (1.829 m)   Wt 215 lb (97.5 kg)   SpO2 97% Comment: RA  BMI 29.16 kg/m   Alert NAD PEG tube site is completely healed   \    Assessment / Plan:   34 year old male who presented with esophageal perforation due to food impaction, will also has evidence of aspiration that was present at his original admission.  I have ordered a modified barium swallow for evaluation and will speak with his primary care physician to follow-up on results.  From a surgical standpoint, there are no issues that need to be addressed.  He will follow-up as needed   Julian Gray 09/29/2019 4:40 PM

## 2019-10-05 ENCOUNTER — Other Ambulatory Visit: Payer: Self-pay | Admitting: Thoracic Surgery (Cardiothoracic Vascular Surgery)

## 2019-10-05 ENCOUNTER — Other Ambulatory Visit (HOSPITAL_COMMUNITY): Payer: Self-pay

## 2019-10-05 DIAGNOSIS — I639 Cerebral infarction, unspecified: Secondary | ICD-10-CM

## 2019-10-05 DIAGNOSIS — R05 Cough: Secondary | ICD-10-CM

## 2019-10-05 DIAGNOSIS — R059 Cough, unspecified: Secondary | ICD-10-CM

## 2019-10-05 DIAGNOSIS — T17908D Unspecified foreign body in respiratory tract, part unspecified causing other injury, subsequent encounter: Secondary | ICD-10-CM

## 2019-10-05 DIAGNOSIS — K223 Perforation of esophagus: Secondary | ICD-10-CM

## 2019-10-05 DIAGNOSIS — R1310 Dysphagia, unspecified: Secondary | ICD-10-CM

## 2019-10-09 ENCOUNTER — Ambulatory Visit (HOSPITAL_COMMUNITY)
Admission: RE | Admit: 2019-10-09 | Discharge: 2019-10-09 | Disposition: A | Discharge status: Home / Self Care | Payer: Medicare Other | Source: Ambulatory Visit | Attending: Thoracic Surgery (Cardiothoracic Vascular Surgery) | Admitting: Thoracic Surgery (Cardiothoracic Vascular Surgery)

## 2019-10-09 ENCOUNTER — Other Ambulatory Visit: Payer: Self-pay

## 2019-10-09 ENCOUNTER — Other Ambulatory Visit (HOSPITAL_COMMUNITY): Payer: Self-pay | Admitting: Thoracic Surgery (Cardiothoracic Vascular Surgery)

## 2019-10-09 DIAGNOSIS — R1310 Dysphagia, unspecified: Secondary | ICD-10-CM | POA: Insufficient documentation

## 2019-10-09 DIAGNOSIS — R05 Cough: Secondary | ICD-10-CM | POA: Insufficient documentation

## 2019-10-09 DIAGNOSIS — R059 Cough, unspecified: Secondary | ICD-10-CM

## 2019-10-09 NOTE — Evaluation (Signed)
Modified Barium Swallow Progress Note  Patient Details  Name: Julian Gray MRN: [id number] Date of Birth: 31-Jul-1986  Today's Date: 10/09/2019  Modified Barium Swallow completed.  Full report located under Chart Review in the Imaging Section.  Brief recommendations include the following:  Clinical Impression  Pt was seen for a modified barium swallow study and he presents with mild oropharyngeal dysphagia with resultant transient laryngeal penetration of thin liquids on today's study.    No aspiration was observed with any trials and no laryngeal penetration was observed with trials of nectar-thick liquid, puree, or ground solids. Laryngeal penetration was related to delayed swallow initiation at the level of the pyriform sinuses and delayed laryngeal closure.  Oral phase was remarkable for mildly reduced lingual strength resulting in trace oral residue and reduced lingual control resulting in premature spillage to the valleculae with solids and to the pyriform sinuses with liquids.  Pharyngeal phase was remarkable for reduced BOT retraction and reduced pharyngeal constriction.  Trace to mild pharyngeal residue was observed intermittently in the valleculae, the pyriform sinuses, and on the posterior pharyngeal wall.  Esophageal phase was remarkable for suspected minimally reduced esophageal peristalsis in the lower 1/3 of the esophagus following the barium pill + puree trial; however, the barium pill cleared the esophagus within 30 seconds of swallow initiation while fluoro was on (radiologist was not present to confirm).   Recommend Dysphagia 1 (puree) or Dysphagia 2 (ground) solids per pt/family preference and thin liquids with medications administered whole in puree (cut large pills).  Pt would benefit from the following compensatory strategies: 1) Small bites/sips 2) Slow rate of intake 3) Sit upright 90 degrees 4) Limit distractions. Pt's father was present for this evaluation and he was  thoroughly educated regarding all results and recommendations.  He verbalized understanding.     Swallow Evaluation Recommendations       SLP Diet Recommendations: Dysphagia 1 (Puree) solids;Dysphagia 2 (Fine chop) solids;Thin liquid   Liquid Administration via: Cup;Straw   Medication Administration: Whole meds with puree   Supervision: Full supervision/cueing for compensatory strategies   Compensations: Minimize environmental distractions;Slow rate;Small sips/bites   Postural Changes: Seated upright at 90 degrees   Oral Care Recommendations: Oral care BID       Villa Herb M.S., CCC-SLP Acute Rehabilitation Services Office: [phone]  Shanon Rosser Eleno Weimar 10/09/2019,1:44 PM

## 2020-04-02 ENCOUNTER — Other Ambulatory Visit: Payer: Self-pay | Admitting: Physician Assistant

## 2021-01-23 LAB — LAB REPORT - SCANNED: EGFR: 111.3

## 2021-09-03 ENCOUNTER — Other Ambulatory Visit: Payer: Self-pay

## 2021-09-03 ENCOUNTER — Encounter (HOSPITAL_COMMUNITY): Payer: Self-pay

## 2021-09-03 ENCOUNTER — Emergency Department (HOSPITAL_COMMUNITY): Payer: Medicare Other

## 2021-09-03 ENCOUNTER — Inpatient Hospital Stay (HOSPITAL_COMMUNITY)
Admission: EM | Admit: 2021-09-03 | Discharge: 2021-09-10 | DRG: 492 | Disposition: A | Discharge status: Home / Self Care | Payer: Medicare Other | Attending: Internal Medicine | Admitting: Internal Medicine

## 2021-09-03 DIAGNOSIS — S82832A Other fracture of upper and lower end of left fibula, initial encounter for closed fracture: Secondary | ICD-10-CM | POA: Diagnosis present

## 2021-09-03 DIAGNOSIS — F79 Unspecified intellectual disabilities: Secondary | ICD-10-CM | POA: Diagnosis present

## 2021-09-03 DIAGNOSIS — A419 Sepsis, unspecified organism: Secondary | ICD-10-CM | POA: Diagnosis not present

## 2021-09-03 DIAGNOSIS — Z20822 Contact with and (suspected) exposure to covid-19: Secondary | ICD-10-CM | POA: Diagnosis present

## 2021-09-03 DIAGNOSIS — W010XXA Fall on same level from slipping, tripping and stumbling without subsequent striking against object, initial encounter: Secondary | ICD-10-CM | POA: Diagnosis present

## 2021-09-03 DIAGNOSIS — J9601 Acute respiratory failure with hypoxia: Secondary | ICD-10-CM | POA: Diagnosis not present

## 2021-09-03 DIAGNOSIS — K5900 Constipation, unspecified: Secondary | ICD-10-CM

## 2021-09-03 DIAGNOSIS — S82302A Unspecified fracture of lower end of left tibia, initial encounter for closed fracture: Principal | ICD-10-CM | POA: Diagnosis present

## 2021-09-03 DIAGNOSIS — Z9281 Personal history of extracorporeal membrane oxygenation (ECMO): Secondary | ICD-10-CM | POA: Diagnosis not present

## 2021-09-03 DIAGNOSIS — Z419 Encounter for procedure for purposes other than remedying health state, unspecified: Secondary | ICD-10-CM

## 2021-09-03 DIAGNOSIS — S8292XA Unspecified fracture of left lower leg, initial encounter for closed fracture: Secondary | ICD-10-CM | POA: Diagnosis not present

## 2021-09-03 DIAGNOSIS — R112 Nausea with vomiting, unspecified: Secondary | ICD-10-CM

## 2021-09-03 DIAGNOSIS — Z79899 Other long term (current) drug therapy: Secondary | ICD-10-CM | POA: Diagnosis not present

## 2021-09-03 DIAGNOSIS — R509 Fever, unspecified: Secondary | ICD-10-CM

## 2021-09-03 DIAGNOSIS — J189 Pneumonia, unspecified organism: Secondary | ICD-10-CM | POA: Diagnosis not present

## 2021-09-03 DIAGNOSIS — Y9301 Activity, walking, marching and hiking: Secondary | ICD-10-CM | POA: Diagnosis present

## 2021-09-03 DIAGNOSIS — F840 Autistic disorder: Secondary | ICD-10-CM | POA: Diagnosis present

## 2021-09-03 DIAGNOSIS — G40909 Epilepsy, unspecified, not intractable, without status epilepticus: Secondary | ICD-10-CM | POA: Diagnosis present

## 2021-09-03 DIAGNOSIS — R000 Tachycardia, unspecified: Secondary | ICD-10-CM | POA: Diagnosis not present

## 2021-09-03 DIAGNOSIS — M79605 Pain in left leg: Secondary | ICD-10-CM | POA: Diagnosis present

## 2021-09-03 DIAGNOSIS — T148XXA Other injury of unspecified body region, initial encounter: Secondary | ICD-10-CM

## 2021-09-03 LAB — COMPREHENSIVE METABOLIC PANEL
ALT: 22 U/L (ref 0–44)
AST: 18 U/L (ref 15–41)
Albumin: 3.8 g/dL (ref 3.5–5.0)
Alkaline Phosphatase: 87 U/L (ref 38–126)
Anion gap: 8 (ref 5–15)
BUN: 18 mg/dL (ref 6–20)
CO2: 22 mmol/L (ref 22–32)
Calcium: 8.1 mg/dL — ABNORMAL LOW (ref 8.9–10.3)
Chloride: 108 mmol/L (ref 98–111)
Creatinine, Ser: 0.75 mg/dL (ref 0.61–1.24)
GFR, Estimated: >60 mL/min (ref >60)
Glucose, Bld: 97 mg/dL (ref 70–99)
Potassium: 3.9 mmol/L (ref 3.5–5.1)
Sodium: 138 mmol/L (ref 135–145)
Total Bilirubin: 0.5 mg/dL (ref 0.3–1.2)
Total Protein: 6.7 g/dL (ref 6.5–8.1)

## 2021-09-03 LAB — CBC WITH DIFFERENTIAL/PLATELET
Abs Immature Granulocytes: 0.05 10*3/uL (ref 0.00–0.07)
Basophils Absolute: 0 10*3/uL (ref 0.0–0.1)
Basophils Relative: 0 %
Eosinophils Absolute: 0.3 10*3/uL (ref 0.0–0.5)
Eosinophils Relative: 3 %
HCT: 39.8 % (ref 39.0–52.0)
Hemoglobin: 13.5 g/dL (ref 13.0–17.0)
Immature Granulocytes: 1 %
Lymphocytes Relative: 15 %
Lymphs Abs: 1.4 10*3/uL (ref 0.7–4.0)
MCH: 30.8 pg (ref 26.0–34.0)
MCHC: 33.9 g/dL (ref 30.0–36.0)
MCV: 90.7 fL (ref 80.0–100.0)
Monocytes Absolute: 0.6 10*3/uL (ref 0.1–1.0)
Monocytes Relative: 7 %
Neutro Abs: 7.1 10*3/uL (ref 1.7–7.7)
Neutrophils Relative %: 74 %
Platelets: 124 10*3/uL — ABNORMAL LOW (ref 150–400)
RBC: 4.39 MIL/uL (ref 4.22–5.81)
RDW: 12.6 % (ref 11.5–15.5)
WBC: 9.5 10*3/uL (ref 4.0–10.5)
nRBC: 0 % (ref 0.0–0.2)

## 2021-09-03 LAB — RESP PANEL BY RT-PCR (FLU A&B, COVID) ARPGX2
Influenza A by PCR: NEGATIVE
Influenza B by PCR: NEGATIVE
SARS Coronavirus 2 by RT PCR: NEGATIVE

## 2021-09-03 MED ORDER — FENTANYL CITRATE PF 50 MCG/ML IJ SOSY
50.0000 ug | PREFILLED_SYRINGE | Freq: Once | INTRAMUSCULAR | Status: AC
  Administered 2021-09-03: 50 ug via INTRAVENOUS
  Filled 2021-09-03: qty 1

## 2021-09-03 MED ORDER — ONDANSETRON HCL 4 MG/2ML IJ SOLN
4.0000 mg | Freq: Four times a day (QID) | INTRAMUSCULAR | Status: DC | PRN
  Administered 2021-09-04: 4 mg via INTRAVENOUS

## 2021-09-03 MED ORDER — ONDANSETRON HCL 4 MG PO TABS: 4.0000 mg | ORAL_TABLET | Freq: Four times a day (QID) | ORAL | Status: DC | PRN

## 2021-09-03 MED ORDER — ACETAMINOPHEN 325 MG PO TABS: 650.0000 mg | ORAL_TABLET | Freq: Four times a day (QID) | ORAL | Status: DC | PRN

## 2021-09-03 MED ORDER — SENNOSIDES-DOCUSATE SODIUM 8.6-50 MG PO TABS: 1.0000 | ORAL_TABLET | Freq: Every evening | ORAL | Status: DC | PRN

## 2021-09-03 MED ORDER — MORPHINE SULFATE (PF) 2 MG/ML IV SOLN
1.0000 mg | INTRAVENOUS | Status: DC | PRN
  Administered 2021-09-04 (×3): 1 mg via INTRAVENOUS
  Filled 2021-09-03 (×3): qty 1

## 2021-09-03 MED ORDER — CLOZAPINE 25 MG PO TABS
50.0000 mg | ORAL_TABLET | Freq: Three times a day (TID) | ORAL | Status: DC
  Administered 2021-09-03 – 2021-09-10 (×20): 50 mg via ORAL
  Filled 2021-09-03 (×24): qty 2

## 2021-09-03 MED ORDER — HYDROXYZINE HCL 25 MG PO TABS
25.0000 mg | ORAL_TABLET | Freq: Four times a day (QID) | ORAL | Status: DC | PRN
  Administered 2021-09-04: 25 mg via ORAL
  Filled 2021-09-03: qty 1

## 2021-09-03 MED ORDER — ACETAMINOPHEN 650 MG RE SUPP: 650.0000 mg | Freq: Four times a day (QID) | RECTAL | Status: DC | PRN

## 2021-09-03 MED ORDER — OXCARBAZEPINE 300 MG PO TABS
600.0000 mg | ORAL_TABLET | Freq: Two times a day (BID) | ORAL | Status: DC
  Administered 2021-09-03 – 2021-09-10 (×13): 600 mg via ORAL
  Filled 2021-09-03 (×16): qty 2

## 2021-09-03 NOTE — ED Triage Notes (Signed)
EMS reports fall on sidewalk, obvious injury to left leg. Pt Hx of IDD.  BP 130/72 HR 100 Sp02 99 RA RR 22  22 RAC 150 Fentanyl enroute

## 2021-09-03 NOTE — Hospital Course (Signed)
Julian Gray is a 36 y.o. 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

## 2021-09-03 NOTE — ED Provider Notes (Signed)
[facility] Provider Note   CSN: [id number] Arrival date & time: 09/03/21  1748     History  Chief Complaint  Patient presents with   Fall   Leg Injury    Julian FlakesKyle R Jain is a 36 y.o. male.  The history is provided by a relative and medical records. No language interpreter was used.  Fall This is a new problem. The current episode started 1 to 2 hours ago. The problem occurs constantly. The problem has not changed since onset.Pertinent negatives include no chest pain, no abdominal pain, no headaches and no shortness of breath. The symptoms are aggravated by twisting. Nothing relieves the symptoms. He has tried nothing for the symptoms. The treatment provided no relief.      Home Medications Prior to Admission medications   Medication Sig Start Date End Date Taking? Authorizing Provider  acetaminophen (TYLENOL) 160 MG/5ML solution Take 20.3 mLs (650 mg total) by mouth every 6 (six) hours as needed for mild pain or fever. 09/12/19   Liborio NixonPatel, Janki Y, MD  clonazePAM (KLONOPIN) 1 MG tablet Take 1 tablet (1 mg total) by mouth at bedtime as needed (sleep). 09/12/19   Liborio NixonPatel, Janki Y, MD  clozapine (CLOZARIL) 50 MG tablet Take 1 tablet (50 mg total) by mouth 3 (three) times daily. 09/12/19   Liborio NixonPatel, Janki Y, MD  metoprolol tartrate (LOPRESSOR) 25 mg/10 mL SUSP Take 5 mLs (12.5 mg total) by mouth 2 (two) times daily. Patient not taking: Reported on 09/29/2019 09/12/19   Liborio NixonPatel, Janki Y, MD  OLANZapine (ZYPREXA) 15 MG tablet Take 1 tablet (15 mg total) by mouth at bedtime. 09/12/19   Liborio NixonPatel, Janki Y, MD  oxcarbazepine (TRILEPTAL) 600 MG tablet Take 1 tablet (600 mg total) by mouth 2 (two) times daily. 09/12/19   Liborio NixonPatel, Janki Y, MD      Allergies    Patient has no known allergies.    Review of Systems   Review of Systems  Unable to perform ROS: Patient nonverbal (minimally verbal at baseline)  Constitutional:  Negative for chills, fatigue and fever.  HENT:  Negative for  congestion.   Respiratory:  Negative for cough, chest tightness and shortness of breath.   Cardiovascular:  Negative for chest pain.  Gastrointestinal:  Negative for abdominal pain, constipation, diarrhea, nausea and vomiting.  Genitourinary:  Negative for dysuria and flank pain.  Musculoskeletal:  Negative for back pain, neck pain and neck stiffness.  Skin:  Positive for wound (abrasion). Negative for rash.  Neurological:  Negative for headaches.  Psychiatric/Behavioral:  Negative for agitation.   All other systems reviewed and are negative.  Physical Exam Updated Vital Signs BP (!) 153/116    Pulse 65    Resp 18    SpO2 100%  Physical Exam Vitals and nursing note reviewed.  Constitutional:      General: He is not in acute distress.    Appearance: He is well-developed. He is not ill-appearing, toxic-appearing or diaphoretic.  HENT:     Head: Normocephalic and atraumatic.     Nose: No congestion or rhinorrhea.     Mouth/Throat:     Mouth: Mucous membranes are moist.  Eyes:     Extraocular Movements: Extraocular movements intact.     Conjunctiva/sclera: Conjunctivae normal.     Pupils: Pupils are equal, round, and reactive to light.  Cardiovascular:     Rate and Rhythm: Normal rate and regular rhythm.     Heart sounds: No murmur heard. Pulmonary:  Effort: Pulmonary effort is normal. No respiratory distress.     Breath sounds: Normal breath sounds. No wheezing, rhonchi or rales.  Chest:     Chest wall: No tenderness.  Abdominal:     Palpations: Abdomen is soft.     Tenderness: There is no abdominal tenderness.  Musculoskeletal:        General: Tenderness and signs of injury present. No swelling.     Cervical back: Neck supple. No tenderness.     Left knee: No erythema (abrasion) or lacerations.     Left ankle: Swelling present. Tenderness present.       Legs:     Comments: No hip tenderness on exam.  No back tenderness on exam.  Normal strength and sensation in upper  extremities.  Symmetric smile.  Pupils symmetric and reactive.  Skin:    General: Skin is warm and dry.     Capillary Refill: Capillary refill takes less than 2 seconds.     Findings: Bruising: abrasion.  Neurological:     Mental Status: He is alert. Mental status is at baseline.     Sensory: No sensory deficit.     Motor: No weakness.  Psychiatric:        Mood and Affect: Mood normal.    ED Results / Procedures / Treatments   Labs (all labs ordered are listed, but only abnormal results are displayed) Labs Reviewed  CBC WITH DIFFERENTIAL/PLATELET - Abnormal; Notable for the following components:      Result Value   Platelets 124 (*)    All other components within normal limits  COMPREHENSIVE METABOLIC PANEL - Abnormal; Notable for the following components:   Calcium 8.1 (*)    All other components within normal limits  RESP PANEL BY RT-PCR (FLU A&B, COVID) [id number]    EKG None  Radiology DG Tibia/Fibula Left  Result Date: 09/03/2021 CLINICAL DATA:  Fall, left leg deformity EXAM: LEFT TIBIA AND FIBULA - 2 VIEW COMPARISON:  None. FINDINGS: Mid/distal fibular shaft fracture, with 1 shaft width lateral displacement. Distal tibial shaft fracture, with 1 shaft width lateral displacement. Associated soft tissue swelling/deformity. IMPRESSION: Distal tibial and fibular shaft fractures, mildly displaced. Electronically Signed   By: Charline Bills M.D.   On: 09/03/2021 19:31   DG Ankle Complete Left  Result Date: 09/03/2021 CLINICAL DATA:  Fall, leg injury EXAM: LEFT ANKLE COMPLETE - 3+ VIEW COMPARISON:  None. FINDINGS: Mid fibular and distal tibial shaft fractures, incompletely visualized. The ankle mortise is intact. The base of the fifth metatarsal is unremarkable. Soft tissue swelling. IMPRESSION: Mid fibular and distal tibial shaft fractures, incompletely visualized. Electronically Signed   By: Charline Bills M.D.   On: 09/03/2021 19:30   DG Knee Complete 4 Views Left  Result  Date: 09/03/2021 CLINICAL DATA:  Fall, leg injury EXAM: LEFT KNEE - COMPLETE 4+ VIEW COMPARISON:  None. FINDINGS: No fracture or dislocation is seen. The joint spaces are preserved. Visualized soft tissues are within normal limits. No suprapatellar knee joint effusion. IMPRESSION: Negative. Electronically Signed   By: Charline Bills M.D.   On: 09/03/2021 19:29    Procedures Procedures    Medications Ordered in ED Medications  morphine (PF) 2 MG/ML injection 1 mg (has no administration in time range)  cloZAPine (CLOZARIL) tablet 50 mg (has no administration in time range)  Oxcarbazepine (TRILEPTAL) tablet 600 mg (has no administration in time range)  fentaNYL (SUBLIMAZE) injection 50 mcg (50 mcg Intravenous Given 09/03/21 1837)  fentaNYL (SUBLIMAZE) injection  50 mcg (50 mcg Intravenous Given 09/03/21 2014)    ED Course/ Medical Decision Making/ A&P                           Medical Decision Making Amount and/or Complexity of Data Reviewed Labs: ordered. Radiology: ordered.  Risk Prescription drug management. Decision regarding hospitalization.    Julian Gray is a 36 y.o. male with a past medical history significant for perinatal anoxic ischemic brain injury, intellectual disability, MR, epilepsy, autism, and chronic gait troubles who presents with fall and leg injury.  According to family, patient was going to his dance class when he tripped in the parking lot and heard a loud snap to his left lower leg.  The patient reportedly fell to the ground but did not lose consciousness per bystanders report to family.  Patient is able to say some yes and no answers and is only describing pain in the left leg.  He is saying no to pain in his head, neck, chest, abdomen, pelvis, or back.  Per EMS there was visible deformity and a temporary splint was applied during transport.  Patient is here with family who is able to help communicate with the patient and mental status seems at his  baseline.  On my exam, lungs clear and chest nontender.  Abdomen nontender, hips nontender.  Back nontender.  Pupils are symmetric and reactive normal extraocular movements.  Patient is unable to speak but this is at his baseline by report.  He was logrolled and removed from the board without any back tenderness on exam initially.  Splint was removed and he has abrasion to the left knee but no deep laceration.  He has tenderness and swelling of the distal left tib-fib area/ankle.  Intact DP and PT pulse, intact cap refill, intact sensation, and is able to wiggle his toes.  Ankle range of motion limited by pain it seems.  Patient was given more pain medicine and will get x-rays.  We will also get some screening labs and a likely preadmit/preprocedural COVID test.  Family reports that he always has some difficulty with ambulation and due to his MR is prone to falling.  He is unable to realize when he is injured and is at high risk to fall again if there is any injury discovered today.  They are very concerned about him going home depending on what his injuries reveal as he is not someone who will be able to likely safely use crutches and a splint if fractures are discovered.  If injuries are discovered, anticipate admission for further management.  7:42 PM X-ray shows distal midshaft tib-fib fractures.  We will call orthopedic I discussed the plan.  I feel patient will need admission.  7:50 PM Spoke to Dr. Eulah PontMurphy with orthopedic surgery who agrees with the plan for medicine [facility] and he will get surgery tomorrow to fix the fracture.  He reports that Dr. Jena GaussHaddix will operate and if the patient does not get a [facility] overnight, he will need to be transferred at around 7 AM so that he could go to the preop area to have his procedure.  He recommended a posterior short leg splint and medicine admission given his complex medical history otherwise.  Patient will be  admitted.        Final Clinical Impression(s) / ED Diagnoses Final diagnoses:  Closed fracture of left lower extremity, initial encounter  Clinical Impression: 1. Closed fracture of left lower extremity, initial encounter     Disposition: Admit  This note was prepared with assistance of Dragon voice recognition software. Occasional wrong-word or sound-a-like substitutions may have occurred due to the inherent limitations of voice recognition software.     Thuy Atilano, Canary Brim, MD 09/03/21 2111

## 2021-09-03 NOTE — Assessment & Plan Note (Signed)
Continue home oxcarbazepine and clozapine for mood and hydroxyzine as needed for anxiety.

## 2021-09-03 NOTE — H&P (Signed)
History and Physical    Julian Gray [id number] DOB: [birth year] DOA: 09/03/2021  PCP: Haywood Pao, MD  Patient coming from: Home  I have personally briefly reviewed patient's old medical records in [place]  Chief Complaint: Left leg injury  HPI: Julian Gray is a 36 y.o. 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  Patient is unable to provide history due to largely nonverbal status and is otherwise obtained by EDP, chart review, and patient's mother at bedside.  Patient's mother states that patient has chronic difficulty with gait.  He does not use cane or walker with ambulation.  He was walking to his dance class when he tripped in the parking lot.  His sister was with him and reported to family that patient immediately began to cry and they assumed he suffered a injury to his left lower leg.  EMS were called and there was a reported visible deformity.  A temporary splint was applied during transport to the ED.  ED Course:  Initial vitals showed BP 153/116, pulse 65, RR 18, temp 97.7 F, SPO2 100% on room air.  Labs show WBC 9.5, hemoglobin 13.5, platelets 124,000, sodium 138, potassium 3.9, bicarb 22, BUN 18, creatinine 0.75, serum glucose 97, LFTs within normal limits.  SARS-CoV-2 and influenza PCR negative.  Left knee x-ray negative for acute fracture or dislocation.  Left tibia/fibula x-ray shows distal tibial and fibular shaft fractures, mildly displaced.  Patient was given IV fentanyl for pain.  EDP discussed with on-call orthopedic surgery, Dr. Percell Miller, who recommended admission to [facility] for surgical fixation by Dr. Doreatha Martin in the morning.  The hospitalist service was consulted to admit for further evaluation and management.  Review of  Systems:  Unable to obtain full review of systems due to nonverbal status.  Past Medical History:  Diagnosis Date   Epilepsy ([place])    Mental retardation    Personal history of extracorporeal membrane oxygenation (ECMO)     Past Surgical History:  Procedure Laterality Date   CHEST TUBE INSERTION Right 08/24/2019   Procedure: CHEST TUBE INSERTION;  Surgeon: Lajuana Matte, MD;  Location: [facility] OR;  Service: Thoracic;  Laterality: Right;   ESOPHAGOGASTRODUODENOSCOPY Right 08/24/2019   Procedure: ESOPHAGOGASTRODUODENOSCOPY (EGD) percutaneous gastrostomy tube placement, esophageal stent placement, right chest tube placement;  Surgeon: Lajuana Matte, MD;  Location: [facility] OR;  Service: Thoracic;  Laterality: Right;   ESOPHAGOGASTRODUODENOSCOPY N/A 09/11/2019   Procedure: ESOPHAGOGASTRODUODENOSCOPY (EGD) esophageal stent removal, esophagram.  will need C-arm, and omniview oral contrast;  Surgeon: Lajuana Matte, MD;  Location: [place];  Service: Thoracic;  Laterality: N/A;   IR REPLC GASTRO/COLONIC TUBE PERCUT W/FLUORO  09/10/2019   REMOVAL OF GASTROSTOMY TUBE N/A 08/24/2019   Procedure: PERCUTANEOUS INSERTION OF GASTROSTOMY TUBE;  Surgeon: Lajuana Matte, MD;  Location: [place];  Service: Thoracic;  Laterality: N/A;   VIDEO BRONCHOSCOPY  09/11/2019   Procedure: Video Bronchoscopy;  Surgeon: Lajuana Matte, MD;  Location: [place];  Service: Thoracic;;    Social History:  reports that he has never smoked. He has never used smokeless tobacco. He reports that he does not drink alcohol and does not use drugs.  No Known Allergies  Family History  Problem Relation Age of Onset   Ataxia Neg Hx    Chorea  Neg Hx    Dementia Neg Hx    Mental retardation Neg Hx    Migraines Neg Hx    Multiple sclerosis Neg Hx    Neurofibromatosis Neg Hx    Neuropathy Neg Hx    Parkinsonism Neg Hx    Seizures Neg Hx    Stroke Neg Hx      Prior to Admission medications   Medication Sig Start Date  End Date Taking? Authorizing Provider  acetaminophen (TYLENOL) 160 MG/5ML solution Take 20.3 mLs (650 mg total) by mouth every 6 (six) hours as needed for mild pain or fever. 09/12/19   Lucky Cowboy, MD  clonazePAM (KLONOPIN) 1 MG tablet Take 1 tablet (1 mg total) by mouth at bedtime as needed (sleep). 09/12/19   Lucky Cowboy, MD  clozapine (CLOZARIL) 50 MG tablet Take 1 tablet (50 mg total) by mouth 3 (three) times daily. 09/12/19   Lucky Cowboy, MD  metoprolol tartrate (LOPRESSOR) 25 mg/10 mL SUSP Take 5 mLs (12.5 mg total) by mouth 2 (two) times daily. Patient not taking: Reported on 09/29/2019 09/12/19   Lucky Cowboy, MD  OLANZapine (ZYPREXA) 15 MG tablet Take 1 tablet (15 mg total) by mouth at bedtime. 09/12/19   Lucky Cowboy, MD  oxcarbazepine (TRILEPTAL) 600 MG tablet Take 1 tablet (600 mg total) by mouth 2 (two) times daily. 09/12/19   Lucky Cowboy, MD    Physical Exam: Vitals:   09/03/21 1830 09/03/21 1900 09/03/21 2000 09/03/21 2200  BP: (!) 191/119 (!) 196/117 (!) 184/117 (!) 193/112  Pulse: 69 82 83 99  Resp: 16 12 16 15   Temp:      TempSrc:      SpO2: 98% 96% 97% 96%   Constitutional: Resting supine in bed, NAD, calm, comfortable Eyes: PERRL, lids and conjunctivae normal ENMT: Mucous membranes are moist. Posterior pharynx clear of any exudate or lesions.Normal dentition.  Neck: normal, supple, no masses. Respiratory: clear to auscultation anteriorly.  Normal respiratory effort. No accessory muscle use.  Cardiovascular: Regular rate and rhythm, no murmurs / rubs / gallops. No extremity edema. 2+ pedal pulses. Abdomen: no tenderness, no masses palpated.  Musculoskeletal: Splint placed left lower extremity Skin: no rashes, lesions, ulcers. No induration Neurologic: CN 2-12 grossly intact. Sensation intact. Strength 5/5 in upper extremities and RLE, not assessed LLE due to fracture. Psychiatric: Nonverbal, nods head yes/no intermittently to question.  Labs on Admission: I have  personally reviewed following labs and imaging studies  EKG: Not performed.  Assessment/Plan Principal Problem:   Closed fracture of left lower extremity, initial encounter Active Problems:   Intellectual disability   Julian Gray is a 36 y.o. 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  Assessment and Plan: * Closed fracture of left lower extremity, initial encounter- (present on admission) Distal tibia and fibula fractures occurring after mechanical fall.  Orthopedic surgery plan for surgical fixation at [facility] 09/04/2021. -Keep n.p.o. after midnight -IV morphine as needed for pain  Intellectual disability Continue home oxcarbazepine and clozapine for mood and hydroxyzine as needed for anxiety.  DVT prophylaxis: SCDs Start: 09/03/21 2321 Code Status: Full code, confirmed with patient's mother at bedside Family Communication: Discussed with patient's mother at bedside Disposition Plan: From home, dispo pending clinical progress Consults called: Orthopedic surgery Severity of Illness: The appropriate patient status  for this patient is INPATIENT. Inpatient status is judged to be reasonable and necessary in order to provide the required intensity of service to ensure the patient's safety. The patient's presenting symptoms, physical exam findings, and initial radiographic and laboratory data in the context of their chronic comorbidities is felt to place them at high risk for further clinical deterioration. Furthermore, it is not anticipated that the patient will be medically stable for discharge from the hospital within 2 midnights of admission.   * I certify that at the point of admission it is my clinical judgment that the patient will require inpatient hospital care spanning beyond 2  midnights from the point of admission due to high intensity of service, high risk for further deterioration and high frequency of surveillance required.Zada Finders MD [facility]  If 7PM-7AM, please contact night-coverage [email or web address]  09/03/2021, 11:25 PM

## 2021-09-03 NOTE — Assessment & Plan Note (Signed)
Distal tibia and fibula fractures occurring after mechanical fall.  Orthopedic surgery plan for surgical fixation at [facility] 09/04/2021. -Keep n.p.o. after midnight -IV morphine as needed for pain

## 2021-09-04 ENCOUNTER — Inpatient Hospital Stay (HOSPITAL_COMMUNITY): Payer: Medicare Other | Admitting: Anesthesiology

## 2021-09-04 ENCOUNTER — Inpatient Hospital Stay (HOSPITAL_COMMUNITY): Payer: Medicare Other

## 2021-09-04 ENCOUNTER — Encounter (HOSPITAL_COMMUNITY): Payer: Self-pay | Admitting: Internal Medicine

## 2021-09-04 ENCOUNTER — Other Ambulatory Visit: Payer: Self-pay

## 2021-09-04 ENCOUNTER — Encounter (HOSPITAL_COMMUNITY)
Admission: EM | Disposition: A | Discharge status: Home / Self Care | Payer: Self-pay | Source: Home / Self Care | Attending: Internal Medicine

## 2021-09-04 DIAGNOSIS — Z419 Encounter for procedure for purposes other than remedying health state, unspecified: Secondary | ICD-10-CM | POA: Diagnosis not present

## 2021-09-04 DIAGNOSIS — S8292XA Unspecified fracture of left lower leg, initial encounter for closed fracture: Secondary | ICD-10-CM | POA: Diagnosis not present

## 2021-09-04 DIAGNOSIS — F79 Unspecified intellectual disabilities: Secondary | ICD-10-CM

## 2021-09-04 HISTORY — PX: TIBIA IM NAIL INSERTION: SHX2516

## 2021-09-04 LAB — CBC WITH DIFFERENTIAL/PLATELET
Abs Immature Granulocytes: 0 10*3/uL (ref 0.00–0.07)
Basophils Absolute: 0 10*3/uL (ref 0.0–0.1)
Basophils Relative: 0 %
Eosinophils Absolute: 0 10*3/uL (ref 0.0–0.5)
Eosinophils Relative: 0 %
HCT: 42.1 % (ref 39.0–52.0)
Hemoglobin: 14.4 g/dL (ref 13.0–17.0)
Lymphocytes Relative: 16 %
Lymphs Abs: 1.9 10*3/uL (ref 0.7–4.0)
MCH: 29.9 pg (ref 26.0–34.0)
MCHC: 34.2 g/dL (ref 30.0–36.0)
MCV: 87.5 fL (ref 80.0–100.0)
Monocytes Absolute: 0.6 10*3/uL (ref 0.1–1.0)
Monocytes Relative: 5 %
Neutro Abs: 9.5 10*3/uL — ABNORMAL HIGH (ref 1.7–7.7)
Neutrophils Relative %: 79 %
Platelets: 189 10*3/uL (ref 150–400)
RBC: 4.81 MIL/uL (ref 4.22–5.81)
RDW: 12.5 % (ref 11.5–15.5)
WBC: 12 10*3/uL — ABNORMAL HIGH (ref 4.0–10.5)
nRBC: 0 % (ref 0.0–0.2)
nRBC: 0 /100 WBC

## 2021-09-04 LAB — BASIC METABOLIC PANEL
Anion gap: 11 (ref 5–15)
BUN: 16 mg/dL (ref 6–20)
CO2: 24 mmol/L (ref 22–32)
Calcium: 9.1 mg/dL (ref 8.9–10.3)
Chloride: 101 mmol/L (ref 98–111)
Creatinine, Ser: 1.13 mg/dL (ref 0.61–1.24)
GFR, Estimated: >60 mL/min (ref >60)
Glucose, Bld: 163 mg/dL — ABNORMAL HIGH (ref 70–99)
Potassium: 4 mmol/L (ref 3.5–5.1)
Sodium: 136 mmol/L (ref 135–145)

## 2021-09-04 LAB — TYPE AND SCREEN
ABO/RH(D): A POS
Antibody Screen: NEGATIVE

## 2021-09-04 LAB — HIV ANTIBODY (ROUTINE TESTING W REFLEX): HIV Screen 4th Generation wRfx: NONREACTIVE

## 2021-09-04 LAB — D-DIMER, QUANTITATIVE: D-Dimer, Quant: 3.41 ug/mL-FEU — ABNORMAL HIGH (ref 0.00–0.50)

## 2021-09-04 IMAGING — CR DG CHEST 2V
3 series · 3 of 3 positions shown · non-contrast
Comparison: 09/03/2019

CLINICAL DATA: Pneumomediastinum

EXAM:
CHEST - 2 VIEW

[w chest pa]
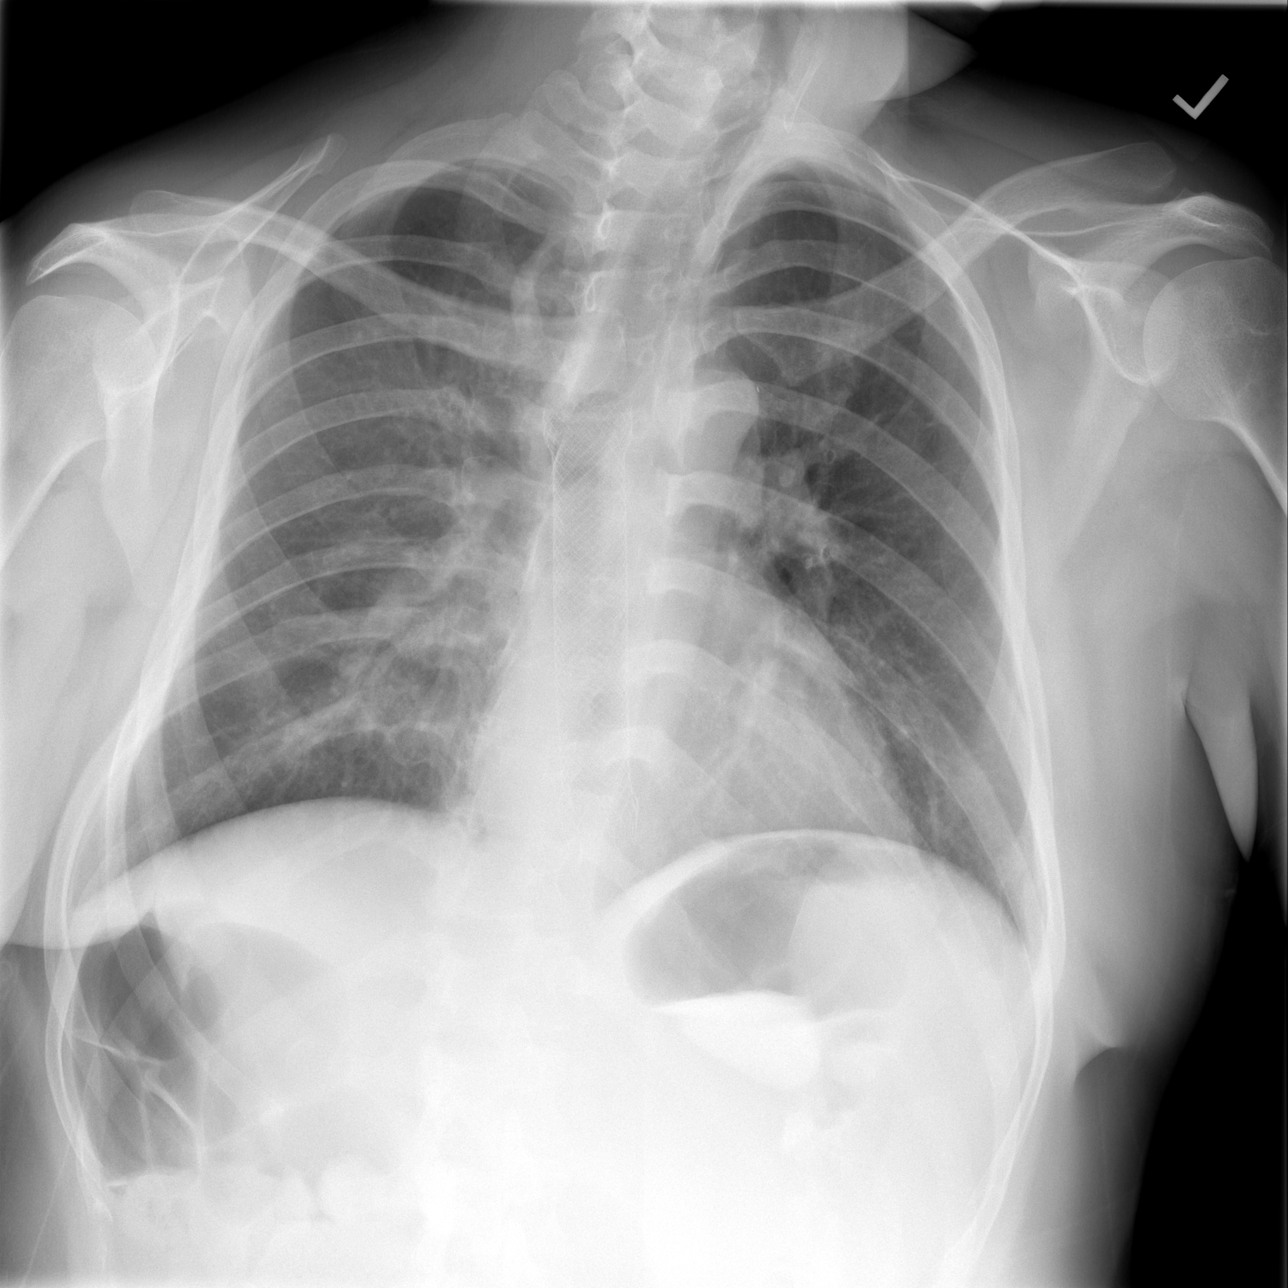

[w chest lat (1 of 2)]
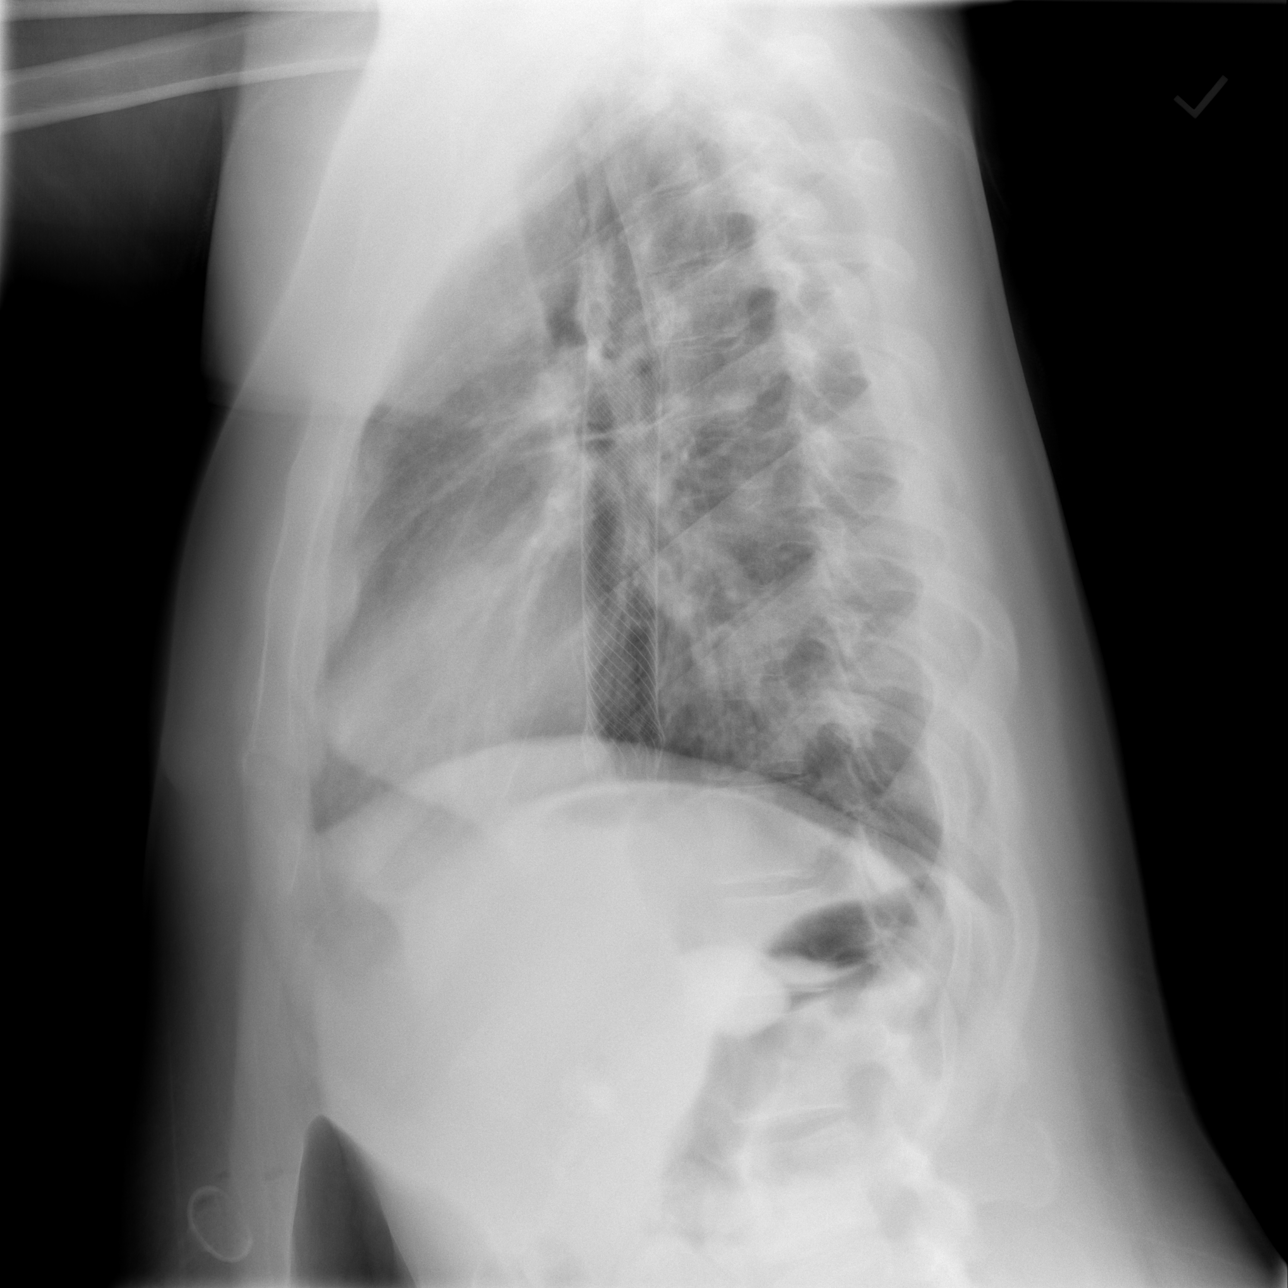

[w chest lat (2 of 2)]
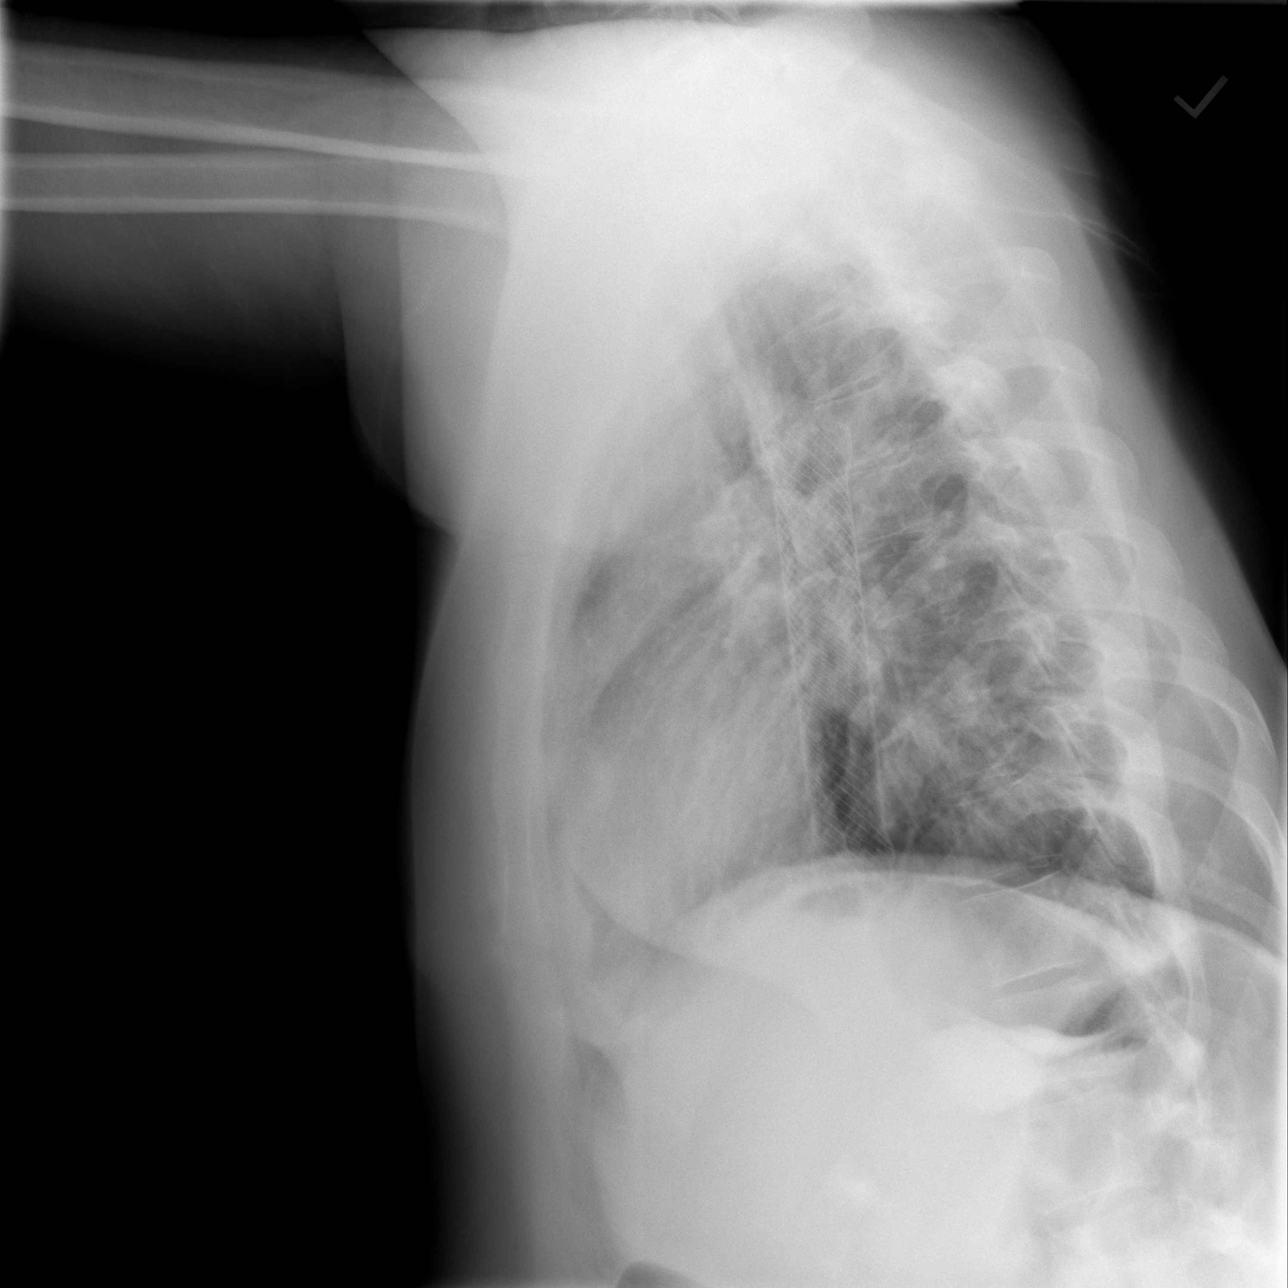

[3 of 3 positions shown; findings below may reference images not displayed]

FINDINGS: Lungs are clear. No pleural effusion or pneumothorax. Stable
cardiomediastinal contours. No pneumomediastinum. Esophageal stent
is noted. Residual contrast within the colon.
IMPRESSION: Clear lungs.  No significant abnormality.

## 2021-09-04 SURGERY — INSERTION, INTRAMEDULLARY ROD, TIBIA
Anesthesia: General | Laterality: Left

## 2021-09-04 MED ORDER — ONDANSETRON HCL 4 MG/2ML IJ SOLN: 4.0000 mg | Freq: Once | INTRAMUSCULAR | Status: DC | PRN

## 2021-09-04 MED ORDER — ROCURONIUM BROMIDE 10 MG/ML (PF) SYRINGE
PREFILLED_SYRINGE | INTRAVENOUS | Status: DC | PRN
  Administered 2021-09-04: 20 mg via INTRAVENOUS
  Administered 2021-09-04: 50 mg via INTRAVENOUS

## 2021-09-04 MED ORDER — HYDROCODONE-ACETAMINOPHEN 7.5-325 MG PO TABS: 1.0000 | ORAL_TABLET | ORAL | Status: DC | PRN

## 2021-09-04 MED ORDER — SUGAMMADEX SODIUM 200 MG/2ML IV SOLN
INTRAVENOUS | Status: DC | PRN
  Administered 2021-09-04: 225 mg via INTRAVENOUS

## 2021-09-04 MED ORDER — VANCOMYCIN HCL 1000 MG IV SOLR
INTRAVENOUS | Status: AC
  Filled 2021-09-04: qty 20

## 2021-09-04 MED ORDER — METHOCARBAMOL 500 MG PO TABS: 500.0000 mg | ORAL_TABLET | Freq: Four times a day (QID) | ORAL | Status: DC | PRN

## 2021-09-04 MED ORDER — CEFAZOLIN SODIUM-DEXTROSE 2-3 GM-%(50ML) IV SOLR
INTRAVENOUS | Status: DC | PRN
Start: 2021-09-04 — End: 2021-09-04
  Administered 2021-09-04: 2 g via INTRAVENOUS

## 2021-09-04 MED ORDER — ORAL CARE MOUTH RINSE: 15.0000 mL | Freq: Once | OROMUCOSAL | Status: AC

## 2021-09-04 MED ORDER — MIDAZOLAM HCL 2 MG/2ML IJ SOLN
INTRAMUSCULAR | Status: AC
  Filled 2021-09-04: qty 2

## 2021-09-04 MED ORDER — ACETAMINOPHEN 325 MG PO TABS
650.0000 mg | ORAL_TABLET | Freq: Four times a day (QID) | ORAL | Status: DC | PRN
  Administered 2021-09-04 – 2021-09-08 (×4): 650 mg via ORAL
  Filled 2021-09-04 (×3): qty 2

## 2021-09-04 MED ORDER — MORPHINE SULFATE (PF) 2 MG/ML IV SOLN
1.0000 mg | INTRAVENOUS | Status: DC | PRN
  Administered 2021-09-04: 1 mg via INTRAVENOUS
  Filled 2021-09-04: qty 1

## 2021-09-04 MED ORDER — FENTANYL CITRATE (PF) 250 MCG/5ML IJ SOLN
INTRAMUSCULAR | Status: AC
  Filled 2021-09-04: qty 5

## 2021-09-04 MED ORDER — CEFAZOLIN SODIUM-DEXTROSE 2-4 GM/100ML-% IV SOLN
2.0000 g | Freq: Three times a day (TID) | INTRAVENOUS | Status: DC
  Administered 2021-09-04 – 2021-09-05 (×2): 2 g via INTRAVENOUS
  Filled 2021-09-04 (×2): qty 100

## 2021-09-04 MED ORDER — ALBUTEROL SULFATE (2.5 MG/3ML) 0.083% IN NEBU
INHALATION_SOLUTION | RESPIRATORY_TRACT | Status: AC
  Filled 2021-09-04: qty 3

## 2021-09-04 MED ORDER — 0.9 % SODIUM CHLORIDE (POUR BTL) OPTIME
TOPICAL | Status: DC | PRN
  Administered 2021-09-04: 1000 mL

## 2021-09-04 MED ORDER — ONDANSETRON HCL 4 MG PO TABS: 4.0000 mg | ORAL_TABLET | Freq: Four times a day (QID) | ORAL | Status: DC | PRN

## 2021-09-04 MED ORDER — PROPOFOL 10 MG/ML IV BOLUS
INTRAVENOUS | Status: AC
  Filled 2021-09-04: qty 20

## 2021-09-04 MED ORDER — CELECOXIB 200 MG PO CAPS
200.0000 mg | ORAL_CAPSULE | Freq: Two times a day (BID) | ORAL | Status: DC
  Administered 2021-09-04 – 2021-09-09 (×10): 200 mg via ORAL
  Filled 2021-09-04 (×10): qty 1

## 2021-09-04 MED ORDER — ASPIRIN 325 MG PO TABS
325.0000 mg | ORAL_TABLET | Freq: Two times a day (BID) | ORAL | Status: DC
  Administered 2021-09-04 – 2021-09-10 (×12): 325 mg via ORAL
  Filled 2021-09-04 (×12): qty 1

## 2021-09-04 MED ORDER — OXYCODONE HCL 5 MG/5ML PO SOLN: 5.0000 mg | Freq: Once | ORAL | Status: DC | PRN

## 2021-09-04 MED ORDER — MIDAZOLAM HCL 2 MG/2ML IJ SOLN
INTRAMUSCULAR | Status: DC | PRN
  Administered 2021-09-04: 2 mg via INTRAVENOUS

## 2021-09-04 MED ORDER — ALBUTEROL SULFATE HFA 108 (90 BASE) MCG/ACT IN AERS
INHALATION_SPRAY | RESPIRATORY_TRACT | Status: DC | PRN
Start: 2021-09-04 — End: 2021-09-04
  Administered 2021-09-04 (×3): 2 via RESPIRATORY_TRACT

## 2021-09-04 MED ORDER — LIDOCAINE 2% (20 MG/ML) 5 ML SYRINGE
INTRAMUSCULAR | Status: DC | PRN
Start: 2021-09-04 — End: 2021-09-04
  Administered 2021-09-04: 40 mg via INTRAVENOUS

## 2021-09-04 MED ORDER — METHOCARBAMOL 1000 MG/10ML IJ SOLN
500.0000 mg | Freq: Four times a day (QID) | INTRAVENOUS | Status: DC | PRN
  Filled 2021-09-04: qty 5

## 2021-09-04 MED ORDER — FENTANYL CITRATE (PF) 100 MCG/2ML IJ SOLN: 25.0000 ug | INTRAMUSCULAR | Status: DC | PRN

## 2021-09-04 MED ORDER — FENTANYL CITRATE (PF) 250 MCG/5ML IJ SOLN
INTRAMUSCULAR | Status: DC | PRN
Start: 2021-09-04 — End: 2021-09-04
  Administered 2021-09-04 (×2): 50 ug via INTRAVENOUS

## 2021-09-04 MED ORDER — SODIUM CHLORIDE 0.9 % IV SOLN: INTRAVENOUS | Status: DC

## 2021-09-04 MED ORDER — CHLORHEXIDINE GLUCONATE 0.12 % MT SOLN
15.0000 mL | Freq: Once | OROMUCOSAL | Status: AC
  Administered 2021-09-04: 15 mL via OROMUCOSAL

## 2021-09-04 MED ORDER — ACETAMINOPHEN 160 MG/5ML PO SOLN: 325.0000 mg | ORAL | Status: DC | PRN

## 2021-09-04 MED ORDER — METOCLOPRAMIDE HCL 5 MG/ML IJ SOLN: 5.0000 mg | Freq: Three times a day (TID) | INTRAMUSCULAR | Status: DC | PRN

## 2021-09-04 MED ORDER — HYDROCODONE-ACETAMINOPHEN 5-325 MG PO TABS
1.0000 | ORAL_TABLET | ORAL | Status: DC | PRN
  Administered 2021-09-04: 2 via ORAL
  Administered 2021-09-05: 1 via ORAL
  Administered 2021-09-05: 2 via ORAL
  Filled 2021-09-04: qty 2
  Filled 2021-09-04: qty 1
  Filled 2021-09-04: qty 2

## 2021-09-04 MED ORDER — MEPERIDINE HCL 25 MG/ML IJ SOLN: 6.2500 mg | INTRAMUSCULAR | Status: DC | PRN

## 2021-09-04 MED ORDER — POLYETHYLENE GLYCOL 3350 17 G PO PACK
17.0000 g | PACK | Freq: Every day | ORAL | Status: DC | PRN
  Administered 2021-09-07: 17 g via ORAL
  Filled 2021-09-04: qty 1

## 2021-09-04 MED ORDER — OXYCODONE HCL 5 MG PO TABS: 5.0000 mg | ORAL_TABLET | Freq: Once | ORAL | Status: DC | PRN

## 2021-09-04 MED ORDER — MORPHINE SULFATE (PF) 2 MG/ML IV SOLN: 0.5000 mg | INTRAVENOUS | Status: DC | PRN

## 2021-09-04 MED ORDER — DEXAMETHASONE SODIUM PHOSPHATE 10 MG/ML IJ SOLN
INTRAMUSCULAR | Status: DC | PRN
Start: 2021-09-04 — End: 2021-09-04
  Administered 2021-09-04: 10 mg via INTRAVENOUS

## 2021-09-04 MED ORDER — PROPOFOL 10 MG/ML IV BOLUS
INTRAVENOUS | Status: DC | PRN
  Administered 2021-09-04: 200 mg via INTRAVENOUS

## 2021-09-04 MED ORDER — DIPHENHYDRAMINE HCL 12.5 MG/5ML PO ELIX: 12.5000 mg | ORAL_SOLUTION | ORAL | Status: DC | PRN

## 2021-09-04 MED ORDER — ACETAMINOPHEN 325 MG PO TABS: 325.0000 mg | ORAL_TABLET | ORAL | Status: DC | PRN

## 2021-09-04 MED ORDER — ONDANSETRON HCL 4 MG/2ML IJ SOLN
4.0000 mg | Freq: Four times a day (QID) | INTRAMUSCULAR | Status: DC | PRN
  Administered 2021-09-09: 4 mg via INTRAVENOUS
  Filled 2021-09-04: qty 2

## 2021-09-04 MED ORDER — ACETAMINOPHEN 325 MG PO TABS: 325.0000 mg | ORAL_TABLET | Freq: Four times a day (QID) | ORAL | Status: DC | PRN

## 2021-09-04 MED ORDER — LACTATED RINGERS IV SOLN: INTRAVENOUS | Status: DC

## 2021-09-04 MED ORDER — HYDRALAZINE HCL 20 MG/ML IJ SOLN
10.0000 mg | Freq: Four times a day (QID) | INTRAMUSCULAR | Status: DC | PRN
  Administered 2021-09-04 (×2): 10 mg via INTRAVENOUS
  Filled 2021-09-04 (×2): qty 1

## 2021-09-04 MED ORDER — VANCOMYCIN HCL 1000 MG IV SOLR
INTRAVENOUS | Status: DC | PRN
  Administered 2021-09-04: 1000 mg via TOPICAL

## 2021-09-04 MED ORDER — METOCLOPRAMIDE HCL 5 MG PO TABS: 5.0000 mg | ORAL_TABLET | Freq: Three times a day (TID) | ORAL | Status: DC | PRN

## 2021-09-04 MED ORDER — CEFAZOLIN SODIUM-DEXTROSE 2-4 GM/100ML-% IV SOLN
INTRAVENOUS | Status: AC
  Filled 2021-09-04: qty 100

## 2021-09-04 MED ORDER — ALBUTEROL SULFATE (2.5 MG/3ML) 0.083% IN NEBU
1.2500 mg | INHALATION_SOLUTION | Freq: Once | RESPIRATORY_TRACT | Status: AC
  Administered 2021-09-04: 1.25 mg via RESPIRATORY_TRACT

## 2021-09-04 MED ORDER — DOCUSATE SODIUM 100 MG PO CAPS
100.0000 mg | ORAL_CAPSULE | Freq: Two times a day (BID) | ORAL | Status: DC
  Administered 2021-09-04 – 2021-09-10 (×12): 100 mg via ORAL
  Filled 2021-09-04 (×12): qty 1

## 2021-09-04 SURGICAL SUPPLY — 57 items
BAG COUNTER SPONGE SURGICOUNT (BAG) ×2 IMPLANT
BIT DRILL CALIBRATED 4.2 (BIT) IMPLANT
BIT DRILL FLEXIBLE LONG 12 (BIT) ×1 IMPLANT
BIT DRILL SHORT 4.2 (BIT) IMPLANT
BLADE SURG 10 STRL SS (BLADE) ×4 IMPLANT
BNDG COHESIVE 4X5 TAN STRL (GAUZE/BANDAGES/DRESSINGS) ×1 IMPLANT
BNDG ELASTIC 4X5.8 VLCR STR LF (GAUZE/BANDAGES/DRESSINGS) ×2 IMPLANT
BNDG ELASTIC 6X5.8 VLCR STR LF (GAUZE/BANDAGES/DRESSINGS) ×2 IMPLANT
BNDG GAUZE ELAST 4 BULKY (GAUZE/BANDAGES/DRESSINGS) ×1 IMPLANT
BOOT STEPPER DURA LG (SOFTGOODS) ×1 IMPLANT
BRUSH SCRUB EZ PLAIN DRY (MISCELLANEOUS) ×4 IMPLANT
CHLORAPREP W/TINT 26 (MISCELLANEOUS) ×2 IMPLANT
COVER SURGICAL LIGHT HANDLE (MISCELLANEOUS) ×4 IMPLANT
DRAPE C-ARM 42X72 X-RAY (DRAPES) ×2 IMPLANT
DRAPE C-ARMOR (DRAPES) ×2 IMPLANT
DRAPE HALF SHEET 40X57 (DRAPES) ×4 IMPLANT
DRAPE IMP U-DRAPE 54X76 (DRAPES) ×4 IMPLANT
DRAPE INCISE IOBAN 66X45 STRL (DRAPES) IMPLANT
DRAPE ORTHO SPLIT 77X108 STRL (DRAPES) ×4
DRAPE SURG ORHT 6 SPLT 77X108 (DRAPES) ×2 IMPLANT
DRAPE U-SHAPE 47X51 STRL (DRAPES) ×2 IMPLANT
DRILL BIT CALIBRATED 4.2 (BIT) ×2
DRILL BIT SHORT 4.2 (BIT) ×2
DRSG ADAPTIC 3X8 NADH LF (GAUZE/BANDAGES/DRESSINGS) ×1 IMPLANT
ELECT REM PT RETURN 9FT ADLT (ELECTROSURGICAL) ×2
ELECTRODE REM PT RTRN 9FT ADLT (ELECTROSURGICAL) ×1 IMPLANT
GAUZE SPONGE 4X4 12PLY STRL (GAUZE/BANDAGES/DRESSINGS) ×3 IMPLANT
GLOVE SURG ENC MOIS LTX SZ6.5 (GLOVE) ×6 IMPLANT
GLOVE SURG ENC MOIS LTX SZ7.5 (GLOVE) ×8 IMPLANT
GLOVE SURG UNDER POLY LF SZ6.5 (GLOVE) ×2 IMPLANT
GLOVE SURG UNDER POLY LF SZ7.5 (GLOVE) ×2 IMPLANT
GOWN STRL REUS W/ TWL LRG LVL3 (GOWN DISPOSABLE) ×2 IMPLANT
GOWN STRL REUS W/TWL LRG LVL3 (GOWN DISPOSABLE) ×4
GUIDEWIRE 3.2X400 (WIRE) ×1 IMPLANT
KIT BASIN OR (CUSTOM PROCEDURE TRAY) ×2 IMPLANT
KIT TURNOVER KIT B (KITS) ×2 IMPLANT
NAIL TIBIAL ADV STERILE 10 345 (Nail) ×1 IMPLANT
PACK TOTAL JOINT (CUSTOM PROCEDURE TRAY) ×2 IMPLANT
PAD ARMBOARD 7.5X6 YLW CONV (MISCELLANEOUS) ×4 IMPLANT
PAD CAST 4YDX4 CTTN HI CHSV (CAST SUPPLIES) IMPLANT
PADDING CAST COTTON 4X4 STRL (CAST SUPPLIES) ×2
PADDING CAST COTTON 6X4 STRL (CAST SUPPLIES) ×1 IMPLANT
REAMER ROD 3.8 BALL TIP 3X950 (ORTHOPEDIC DISPOSABLE SUPPLIES) ×1 IMPLANT
SCREW LOCK HDLS 5X34 (Screw) ×2 IMPLANT
SCREW LOCK HDLS 5X42 (Screw) ×1 IMPLANT
SCREW LOCK HDLS 5X56 (Screw) ×1 IMPLANT
SPONGE T-LAP 18X18 ~~LOC~~+RFID (SPONGE) ×1 IMPLANT
STAPLER VISISTAT 35W (STAPLE) ×1 IMPLANT
STRIP CLOSURE SKIN 1/2X4 (GAUZE/BANDAGES/DRESSINGS) ×1 IMPLANT
SUT MNCRL AB 3-0 PS2 18 (SUTURE) ×2 IMPLANT
SUT VIC AB 0 CT1 27 (SUTURE) ×2
SUT VIC AB 0 CT1 27XBRD ANBCTR (SUTURE) IMPLANT
SUT VIC AB 2-0 CT1 27 (SUTURE) ×4
SUT VIC AB 2-0 CT1 TAPERPNT 27 (SUTURE) IMPLANT
TOWEL GREEN STERILE (TOWEL DISPOSABLE) ×4 IMPLANT
TOWEL GREEN STERILE FF (TOWEL DISPOSABLE) ×2 IMPLANT
YANKAUER SUCT BULB TIP NO VENT (SUCTIONS) IMPLANT

## 2021-09-04 NOTE — Transfer of Care (Signed)
Immediate Anesthesia Transfer of Care Note  Patient: Julian Gray  Procedure(s) Performed: INTRAMEDULLARY (IM) NAIL TIBIAL (Left)  Patient Location: PACU  Anesthesia Type:General  Level of Consciousness: awake, drowsy and patient cooperative  Airway & Oxygen Therapy: Patient Spontanous Breathing and Patient connected to face mask oxygen  Post-op Assessment: Report given to RN, Post -op Vital signs reviewed and stable and Patient moving all extremities X 4  Post vital signs: Reviewed and stable  Last Vitals:  Vitals Value Taken Time  BP    Temp    Pulse    Resp    SpO2      Last Pain:  Vitals:   09/04/21 0830  TempSrc: Oral  PainSc:          Complications: No notable events documented.

## 2021-09-04 NOTE — Anesthesia Postprocedure Evaluation (Signed)
Anesthesia Post Note  Patient: Julian Gray  Procedure(s) Performed: INTRAMEDULLARY (IM) NAIL TIBIAL (Left)     Patient location during evaluation: PACU Anesthesia Type: General Level of consciousness: awake and alert Pain management: pain level controlled Vital Signs Assessment: post-procedure vital signs reviewed and stable Respiratory status: spontaneous breathing, nonlabored ventilation, respiratory function stable and patient connected to nasal cannula oxygen Cardiovascular status: blood pressure returned to baseline and stable Postop Assessment: no apparent nausea or vomiting Anesthetic complications: no   No notable events documented.  Last Vitals:  Vitals:   09/04/21 1245 09/04/21 1300  BP: (!) 147/92 (!) 147/91  Pulse: (!) 133 (!) 137  Resp: (!) 24 (!) 21  Temp:  (!) 36.4 C  SpO2: 95% 94%    Last Pain:  Vitals:   09/04/21 0830  TempSrc: Oral  PainSc:                  Ferdinand Revoir

## 2021-09-04 NOTE — Interval H&P Note (Signed)
History and Physical Interval Note:  09/04/2021 9:43 AM  Julian Gray  has presented today for surgery, with the diagnosis of Left tibia fracture.  The various methods of treatment have been discussed with the patient and family. After consideration of risks, benefits and other options for treatment, the patient has consented to  Procedure(s): INTRAMEDULLARY (IM) NAIL TIBIAL (Left) as a surgical intervention.  The patient's history has been reviewed, patient examined, no change in status, stable for surgery.  I have reviewed the patient's chart and labs.  Questions were answered to the patient's satisfaction.     Caryn Bee P Tynetta Bachmann

## 2021-09-04 NOTE — H&P (View-Only) (Signed)
Orthopaedic Trauma Service (OTS) Consult  ° °Patient ID: °Julian Gray °MRN: [id number] °DOB/AGE: 11/15/1985 35 y.o. ° °Reason for Consult:Left tibia fracture °Referring Physician: Dr. Tim Murphy, MD [facility] ° °HPI: Julian Gray is an 35 y.o. male who is being seen in consultation at request of Dr. Murphy for evaluation of left tibia fracture.  Patient was in the parking lot twisted his leg and was unable to bear weight.  He did not have a significantly amount of pain but he was brought to the emergency room where x-rays showed a distal tibia-fibula fracture.  Complexity of his injury and need OR availability Dr. Murphy orthopedic trauma take over care. ° °The patient ambulates without assist device.  At baseline he only says a couple words and does not fully understand what you are saying according to his father.  Major medical problems other than previous esophageal perforation for which he was treated here.  Patient does not significantly interact with me as the history taker.  He does make eye contact and does wave.  His father provides the majority of the history. ° °Past Medical History:  °Diagnosis Date  ° Epilepsy (HCC)   ° Mental retardation   ° Personal history of extracorporeal membrane oxygenation (ECMO)   ° ° °Past Surgical History:  °Procedure Laterality Date  ° CHEST TUBE INSERTION Right 08/24/2019  ° Procedure: CHEST TUBE INSERTION;  Surgeon: Lightfoot, Harrell O, MD;  Location: [facility] OR;  Service: Thoracic;  Laterality: Right;  ° ESOPHAGOGASTRODUODENOSCOPY Right 08/24/2019  ° Procedure: ESOPHAGOGASTRODUODENOSCOPY (EGD) percutaneous gastrostomy tube placement, esophageal stent placement, right chest tube placement;  Surgeon: Lightfoot, Harrell O, MD;  Location: [facility] OR;  Service: Thoracic;  Laterality: Right;  ° ESOPHAGOGASTRODUODENOSCOPY N/A 09/11/2019  ° Procedure: ESOPHAGOGASTRODUODENOSCOPY (EGD) esophageal stent removal, esophagram.  will need C-arm, and omniview oral contrast;  Surgeon:  Lightfoot, Harrell O, MD;  Location: [facility] OR;  Service: Thoracic;  Laterality: N/A;  ° IR REPLC GASTRO/COLONIC TUBE PERCUT W/FLUORO  09/10/2019  ° REMOVAL OF GASTROSTOMY TUBE N/A 08/24/2019  ° Procedure: PERCUTANEOUS INSERTION OF GASTROSTOMY TUBE;  Surgeon: Lightfoot, Harrell O, MD;  Location: [facility] OR;  Service: Thoracic;  Laterality: N/A;  ° VIDEO BRONCHOSCOPY  09/11/2019  ° Procedure: Video Bronchoscopy;  Surgeon: Lightfoot, Harrell O, MD;  Location: [facility] OR;  Service: Thoracic;;  ° ° °Family History  °Problem Relation Age of Onset  ° Ataxia Neg Hx   ° Chorea Neg Hx   ° Dementia Neg Hx   ° Mental retardation Neg Hx   ° Migraines Neg Hx   ° Multiple sclerosis Neg Hx   ° Neurofibromatosis Neg Hx   ° Neuropathy Neg Hx   ° Parkinsonism Neg Hx   ° Seizures Neg Hx   ° Stroke Neg Hx   ° ° °Social History:  reports that he has never smoked. He has never used smokeless tobacco. He reports that he does not drink alcohol and does not use drugs. ° °Allergies: No Known Allergies ° °Medications:  °No current facility-administered medications on file prior to encounter.  ° °Current Outpatient Medications on File Prior to Encounter  °Medication Sig Dispense Refill  ° clozapine (CLOZARIL) 50 MG tablet Take 1 tablet (50 mg total) by mouth 3 (three) times daily.    ° hydrOXYzine (ATARAX) 25 MG tablet Take 25 mg by mouth every 6 (six) hours as needed for anxiety.    ° oxcarbazepine (TRILEPTAL) 600 MG tablet Take 1 tablet (600 mg total) by mouth 2 (  two) times daily.     acetaminophen (TYLENOL) 160 MG/5ML solution Take 20.3 mLs (650 mg total) by mouth every 6 (six) hours as needed for mild pain or fever. (Patient not taking: Reported on 09/03/2021) 120 mL 0     ROS: Unable unable to obtain due to his mental disability  Exam: Blood pressure (!) 152/108, pulse (!) 107, temperature 98.2 F (36.8 C), temperature source Oral, resp. rate 18, SpO2 94 %. General: No acute distress Orientation: Awake and alert Mood and Affect: Cooperative and  pleasant Gait: Unable to assess Coordination and balance: Unable to fully assess  Left lower extremity: Splint is in place it is clean dry and intact.  There are abrasions to his left knee.  Compartments are soft compressible.  He has active movement of his toes although he is not able to follow commands.  I was not able to provide a sensory exam.  He has warm well-perfused toes with brisk cap refill.  Right lower extremity: Skin without lesions. No tenderness to palpation. Full painless ROM, full strength in each muscle groups without evidence of instability.   Medical Decision Making: Data: Imaging: 2 views of the left tibia and ankle are reviewed which shows a spiral distal tibial third shaft fracture with associated fibula fracture.  No signs of any intra-articular extension both proximally or distally  Labs:  Results for orders placed or performed during the hospital encounter of 09/03/21 (from the past 24 hour(s))  Resp Panel by RT-PCR (Flu A&B, Covid) Nasopharyngeal Swab     Status: None   Collection Time: 09/03/21  6:36 PM   Specimen: Nasopharyngeal Swab; Nasopharyngeal(NP) swabs in vial transport medium  Result Value Ref Range   SARS Coronavirus 2 by RT PCR NEGATIVE NEGATIVE   Influenza A by PCR NEGATIVE NEGATIVE   Influenza B by PCR NEGATIVE NEGATIVE  CBC with Differential     Status: Abnormal   Collection Time: 09/03/21  6:36 PM  Result Value Ref Range   WBC 9.5 4.0 - 10.5 K/uL   RBC 4.39 4.22 - 5.81 MIL/uL   Hemoglobin 13.5 13.0 - 17.0 g/dL   HCT 00.1 74.9 - 44.9 %   MCV 90.7 80.0 - 100.0 fL   MCH 30.8 26.0 - 34.0 pg   MCHC 33.9 30.0 - 36.0 g/dL   RDW 67.5 91.6 - 38.4 %   Platelets 124 (L) 150 - 400 K/uL   nRBC 0.0 0.0 - 0.2 %   Neutrophils Relative % 74 %   Neutro Abs 7.1 1.7 - 7.7 K/uL   Lymphocytes Relative 15 %   Lymphs Abs 1.4 0.7 - 4.0 K/uL   Monocytes Relative 7 %   Monocytes Absolute 0.6 0.1 - 1.0 K/uL   Eosinophils Relative 3 %   Eosinophils Absolute 0.3  0.0 - 0.5 K/uL   Basophils Relative 0 %   Basophils Absolute 0.0 0.0 - 0.1 K/uL   Immature Granulocytes 1 %   Abs Immature Granulocytes 0.05 0.00 - 0.07 K/uL  Comprehensive metabolic panel     Status: Abnormal   Collection Time: 09/03/21  6:36 PM  Result Value Ref Range   Sodium 138 135 - 145 mmol/L   Potassium 3.9 3.5 - 5.1 mmol/L   Chloride 108 98 - 111 mmol/L   CO2 22 22 - 32 mmol/L   Glucose, Bld 97 70 - 99 mg/dL   BUN 18 6 - 20 mg/dL   Creatinine, Ser 6.65 0.61 - 1.24 mg/dL   Calcium 8.1 (L)  8.9 - 10.3 mg/dL   Total Protein 6.7 6.5 - 8.1 g/dL   Albumin 3.8 3.5 - 5.0 g/dL   AST 18 15 - 41 U/L   ALT 22 0 - 44 U/L   Alkaline Phosphatase 87 38 - 126 U/L   Total Bilirubin 0.5 0.3 - 1.2 mg/dL   GFR, Estimated >35 >57 mL/min   Anion gap 8 5 - 15  Type and screen [facility]     Status: None   Collection Time: 09/04/21  6:15 AM  Result Value Ref Range   ABO/RH(D) A POS    Antibody Screen NEG    Sample Expiration      09/07/2021,2359 Performed at [facility], [address] [place], [place] [address]      Imaging or Labs ordered: None  Medical history and chart was reviewed and case discussed with medical provider.  Assessment/Plan: 36 year old male that presents with a left tibial shaft fracture and fibular fracture.  Due to the unstable nature of his injury I recommend proceeding with intramedullary nailing.  Risks and benefits were discussed with the patient's father.  Risks include but not limited to bleeding, infection, malunion, nonunion, hardware failure, hardware irritation, nerve or blood vessel injury, DVT, even the possible anesthetic complications.  He agreed to proceed with surgery and consent was obtained  Roby Lofts, MD [facility] [phone] (office) [email or web address]

## 2021-09-04 NOTE — Anesthesia Procedure Notes (Signed)
Procedure Name: Intubation Date/Time: 09/04/2021 10:04 AM Performed by: Gaylene Brooks, CRNA Pre-anesthesia Checklist: Patient identified, Emergency Drugs available, Suction available and Patient being monitored Patient Re-evaluated:Patient Re-evaluated prior to induction Oxygen Delivery Method: Circle System Utilized Preoxygenation: Pre-oxygenation with 100% oxygen Induction Type: IV induction Ventilation: Mask ventilation without difficulty, Oral airway inserted - appropriate to patient size and Two handed mask ventilation required Laryngoscope Size: Glidescope and 4 Grade View: Grade I Tube type: Oral Tube size: 7.5 mm Number of attempts: 1 Airway Equipment and Method: Stylet and Oral airway Placement Confirmation: ETT inserted through vocal cords under direct vision, positive ETCO2 and breath sounds checked- equal and bilateral Secured at: 23 cm Tube secured with: Tape Dental Injury: Teeth and Oropharynx as per pre-operative assessment  Comments: Glidescope due to history of difficulty with previous intubation. No trouble with glidescope, although after intubation, pt with wheezes throughout lungs, high peak pressures, and sats in upper 80s. Albuterol given. ETT pulled back 1 cm. ETT suctioned with little removed. Pt sat slowly improving

## 2021-09-04 NOTE — Op Note (Signed)
Orthopaedic Surgery Operative Note (CSN: [id number] ) Date of Surgery: 09/04/2021  Admit Date: 09/03/2021   Diagnoses: Pre-Op Diagnoses: Left tibial shaft fracture  Post-Op Diagnosis: Same  Procedures: CPT 27759-Intramedullary nailing of left tibia fracture  Surgeons : Primary: Shona Needles, MD  Assistant: Patrecia Pace, PA-C  Location: OR 3   Anesthesia:General   Antibiotics: Ancef 2g preop with 1 gm vancomycin powder placed topically   Tourniquet time:None    Estimated Blood AB-123456789 mL  Complications:None  Specimens:None  Implants: Implant Name Type Inv. Item Serial No. Manufacturer Lot No. LRB No. Used Action  NAIL TIBIAL ADV STERILE 10 345 - [id number] Nail NAIL TIBIAL ADV STERILE 10 345  [employer] [id number] Left 1 Implanted  SCREW LOCK HDLS [id number] - [id number] Screw SCREW LOCK HDLS [id number]  [employer] [id number] Left 1 Implanted  SCREW LOCK HDLS [id number] - [id number] Screw SCREW LOCK HDLS [id number]  [employer] [id number] Left 1 Implanted  LOW PROFILE LOCKING SCREW F/IM NAIL 0.5MM / L 56MM/ [id number] Screw   [employer] [id number] Left 1 Implanted  SCREW LOCK HDLS [id number] - [id number] Screw SCREW LOCK HDLS [id number]  [employer] [id number] Left 1 Implanted     Indications for Surgery: 36 year old male who sustained a fall and a left distal tibia and fibula fracture.  Due to the unstable nature of his injury I recommend proceeding with intramedullary nailing of his left tibia.  Risks and benefits were discussed with the patient's father.  Risk included but not limited to bleeding, infection, malunion, nonunion, hardware failure, hardware irritation, nerve and blood vessel injury, DVT, even the possibility of steady complications.  He agreed to proceed with surgery and consent was obtained.  Operative Findings: Intramedullary nailing of left tibial shaft fracture using Synthes TNA 10x327mm nail.  Procedure: The patient was identified in the preoperative holding area.  Consent was confirmed with the patient and their family and all questions were answered. The operative extremity was marked after confirmation with the patient. he was then brought back to the operating room by our anesthesia colleagues.  He placed under general anesthetic and carefully transferred to a radiolucent flat top table.  A bump was placed under his operative hip.  The left lower extremity was then prepped and draped in usual sterile fashion.  A timeout was performed to verify the patient, the procedure, and the extremity.  Preoperative antibiotics were dosed.  Fluoroscopic imaging was obtained that showed the unstable nature of his injury.  A lateral parapatellar incision was made and carried down through skin subcutaneous tissue.  I released the lateral retinaculum to mobilize the patella medially.  I directed a threaded guidewire with appropriate starting point and advanced into the proximal metaphysis.  I then used an entry reamer to enter the medullary canal.  A ball-tipped guidewire was then passed down the central canal and seated into the distal metaphysis.  I then measured the length and shows to use a 345 mm nail.  I then sequentially reamed from 8 mm to 11 mm and obtained excellent chatter through the isthmus.  Then passed a 10 x 345 mm nail down the central canal.  Alignment was appropriate for the fracture.  Perfect circle technique was used to place 2 medial to lateral distal interlocking screws.  The targeting arm was used to place 2 proximal interlocking screws.  Final fluoroscopic imaging was obtained.  Incisions were copiously irrigated.  A gram of vancomycin powder was placed into the incisions.  Layer closure of 0  Vicryl, 2-0 Vicryl and 3-0 Monocryl with Steri-Strips were used to close the skin.  Sterile dressing was applied.  A boot was placed to the lower extremity.  He was then awoken from anesthesia and taken to the PACU in stable condition.  Post Op  Plan/Instructions: Patient will be weightbearing as tolerated to the left lower extremity.  He will receive postoperative Ancef.  He will be placed on aspirin 81 mg for DVT prophylaxis.  We will have him mobilize with physical and Occupational Therapy.  I was present and performed the entire surgery.  Patrecia Pace, PA-C did assist me throughout the case. An assistant was necessary given the difficulty in approach, maintenance of reduction and ability to instrument the fracture.   Katha Hamming, MD [facility]

## 2021-09-04 NOTE — Progress Notes (Signed)
Orthopedic Tech Progress Note Patient Details:  Julian Gray 04/16/1986 [id number]  Ortho Devices Type of Ortho Device: CAM walker Ortho Device/Splint Location: well padded fiberglass short leg posterior and stirrup applied to left leg Ortho Device/Splint Interventions: Ordered   Post Interventions Patient Tolerated: Well Instructions Provided: Adjustment of device Dropped off at OR desk.  Darleen Crocker 09/04/2021, 10:44 AM

## 2021-09-04 NOTE — Anesthesia Preprocedure Evaluation (Addendum)
Anesthesia Evaluation  Patient identified by MRN, date of birth, ID band Patient awake and Patient confused    Reviewed: Allergy & Precautions, NPO status , Patient's Chart, lab work & pertinent test results  History of Anesthesia Complications Negative for: history of anesthetic complications  Airway Mallampati: II  TM Distance: >3 FB Neck ROM: Full    Dental  (+) Dental Advisory Given, Teeth Intact   Pulmonary neg pulmonary ROS,    breath sounds clear to auscultation       Cardiovascular negative cardio ROS   Rhythm:Regular Rate:Normal     Neuro/Psych Seizures -, Well Controlled,  PSYCHIATRIC DISORDERS Severe nonverbal autism    GI/Hepatic Neg liver ROS, Esophageal perforation   Endo/Other  negative endocrine ROS  Renal/GU negative Renal ROS     Musculoskeletal   Abdominal   Peds  Hematology  (+) Blood dyscrasia, anemia ,   Anesthesia Other Findings   Reproductive/Obstetrics                           Anesthesia Physical  Anesthesia Plan  ASA: 3  Anesthesia Plan: General   Post-op Pain Management:    Induction: Intravenous  PONV Risk Score and Plan: 2 and Ondansetron and Dexamethasone  Airway Management Planned: Oral ETT  Additional Equipment: None  Intra-op Plan:   Post-operative Plan: Extubation in OR  Informed Consent: I have reviewed the patients History and Physical, chart, labs and discussed the procedure including the risks, benefits and alternatives for the proposed anesthesia with the patient or authorized representative who has indicated his/her understanding and acceptance.     Dental advisory given  Plan Discussed with: CRNA, Surgeon and Anesthesiologist  Anesthesia Plan Comments: CARSIN RANDAZZO is a 36 y.o. 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  Patient is unable to provide history due to largely nonverbal status and is otherwise obtained by EDP)        Anesthesia Quick Evaluation

## 2021-09-04 NOTE — Progress Notes (Addendum)
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  In the ED x-ray of the left knee was negative for acute fracture.  Left tibia/fibula x-ray showed distal tibia/fibular shaft fractures, mildly displaced.  Orthopedic surgery was consulted.    Subjective   Patient seen, nonverbal.  Appears comfortable.   Assessment/Plan:     Close fracture of left lower extremity -Patient has distal tibia and fibula fracture after mechanical fall. -Orthopedic surgery was consulted -Plan to transfer to [facility] for surgery today -Continue IV morphine as needed for pain  Intellectual disability/seizure disorder -Continue clozapine, oxcarbazepine -Continue hydroxyzine as needed for anxiety    Medications     albuterol       [MAR Hold] clozapine  50 mg Oral TID   [MAR Hold] oxcarbazepine  600 mg Oral BID     Data Reviewed:   CBG:  No results for input(s): GLUCAP in the last 168 hours.  SpO2: 94 % O2 Flow Rate (L/min): 6 L/min    Vitals:   09/04/21 1215 09/04/21 1230 09/04/21 1245 09/04/21 1300  BP: (!) 162/95 (!) 148/92 (!) 147/92 (!) 147/91  Pulse: (!) 132 (!) 135 (!) 133 (!) 137  Resp: 20 (!) 22 (!) 24 (!) 21  Temp:    (!) 97.5 F (36.4 C)  TempSrc:      SpO2: 95% (!) 88% 95% 94%  Weight:      Height:          Data Reviewed:  Basic Metabolic Panel: Recent Labs  Lab 09/03/21 1836  NA 138  K 3.9  CL 108  CO2 22  GLUCOSE 97  BUN 18  CREATININE 0.75  CALCIUM 8.1*    CBC: Recent Labs  Lab 09/03/21 1836  WBC 9.5  NEUTROABS 7.1  HGB 13.5  HCT 39.8  MCV 90.7  PLT 124*       Antibiotics: Anti-infectives (From admission, onward)     Start     Dose/Rate Route Frequency Ordered Stop   09/04/21 1024  vancomycin (VANCOCIN) powder  Status:  Discontinued          As needed 09/04/21 1024 09/04/21 1130   09/04/21 0951  ceFAZolin (ANCEF) 2-4 GM/100ML-% IVPB       Note to Pharmacy: Humberto Leep O: cabinet override      09/04/21 0951 09/04/21 2159        DVT prophylaxis: SCDs  Code Status: Full code  Family Communication: Discussed with patient's father at bedside      Objective    Physical Examination:  General-appears in no acute distress Heart-S1-S2, regular, no murmur auscultated Lungs-clear to auscultation bilaterally, no wheezing or crackles auscultated Abdomen-soft, nontender, no organomegaly Extremities-no edema in the lower extremities Neuro-alert, nonverbal, no focal deficits noted   Status is: Inpatient left lower extremity fracture        [place]   [facility] If 7PM-7AM, please contact night-coverage at [email or web address], Office  [phone]   09/04/2021, 1:36 PM  LOS: 1 day

## 2021-09-04 NOTE — ED Notes (Signed)
[facility] called to transport patient to [facility] stay d/t [facility] being unavailable.

## 2021-09-04 NOTE — Consult Note (Signed)
Orthopaedic Trauma Service (OTS) Consult   Patient ID: Julian Gray MRN: [id number] DOB/AGE: 07-Aug-1985 36 y.o.  Reason for Consult:Left tibia fracture Referring Physician: Dr. Renaye Rakersim Murphy, MD [facility]  HPI: Julian Gray is an 36 y.o. male who is being seen in consultation at request of Dr. Eulah PontMurphy for evaluation of left tibia fracture.  Patient was in the parking lot twisted his leg and was unable to bear weight.  He did not have a significantly amount of pain but he was brought to the emergency room where x-rays showed a distal tibia-fibula fracture.  Complexity of his injury and need OR availability Dr. Eulah PontMurphy orthopedic trauma take over care.  The patient ambulates without assist device.  At baseline he only says a couple words and does not fully understand what you are saying according to his father.  Major medical problems other than previous esophageal perforation for which he was treated here.  Patient does not significantly interact with me as the history taker.  He does make eye contact and does wave.  His father provides the majority of the history.  Past Medical History:  Diagnosis Date   Epilepsy (HCC)    Mental retardation    Personal history of extracorporeal membrane oxygenation (ECMO)     Past Surgical History:  Procedure Laterality Date   CHEST TUBE INSERTION Right 08/24/2019   Procedure: CHEST TUBE INSERTION;  Surgeon: Corliss SkainsLightfoot, Harrell O, MD;  Location: [facility] OR;  Service: Thoracic;  Laterality: Right;   ESOPHAGOGASTRODUODENOSCOPY Right 08/24/2019   Procedure: ESOPHAGOGASTRODUODENOSCOPY (EGD) percutaneous gastrostomy tube placement, esophageal stent placement, right chest tube placement;  Surgeon: Corliss SkainsLightfoot, Harrell O, MD;  Location: [facility] OR;  Service: Thoracic;  Laterality: Right;   ESOPHAGOGASTRODUODENOSCOPY N/A 09/11/2019   Procedure: ESOPHAGOGASTRODUODENOSCOPY (EGD) esophageal stent removal, esophagram.  will need C-arm, and omniview oral contrast;  Surgeon:  Corliss SkainsLightfoot, Harrell O, MD;  Location: [facility] OR;  Service: Thoracic;  Laterality: N/A;   IR REPLC GASTRO/COLONIC TUBE PERCUT W/FLUORO  09/10/2019   REMOVAL OF GASTROSTOMY TUBE N/A 08/24/2019   Procedure: PERCUTANEOUS INSERTION OF GASTROSTOMY TUBE;  Surgeon: Corliss SkainsLightfoot, Harrell O, MD;  Location: [facility] OR;  Service: Thoracic;  Laterality: N/A;   VIDEO BRONCHOSCOPY  09/11/2019   Procedure: Video Bronchoscopy;  Surgeon: Corliss SkainsLightfoot, Harrell O, MD;  Location: [facility] OR;  Service: Thoracic;;    Family History  Problem Relation Age of Onset   Ataxia Neg Hx    Chorea Neg Hx    Dementia Neg Hx    Mental retardation Neg Hx    Migraines Neg Hx    Multiple sclerosis Neg Hx    Neurofibromatosis Neg Hx    Neuropathy Neg Hx    Parkinsonism Neg Hx    Seizures Neg Hx    Stroke Neg Hx     Social History:  reports that he has never smoked. He has never used smokeless tobacco. He reports that he does not drink alcohol and does not use drugs.  Allergies: No Known Allergies  Medications:  No current facility-administered medications on file prior to encounter.   Current Outpatient Medications on File Prior to Encounter  Medication Sig Dispense Refill   clozapine (CLOZARIL) 50 MG tablet Take 1 tablet (50 mg total) by mouth 3 (three) times daily.     hydrOXYzine (ATARAX) 25 MG tablet Take 25 mg by mouth every 6 (six) hours as needed for anxiety.     oxcarbazepine (TRILEPTAL) 600 MG tablet Take 1 tablet (600 mg total) by mouth 2 (  two) times daily.     acetaminophen (TYLENOL) 160 MG/5ML solution Take 20.3 mLs (650 mg total) by mouth every 6 (six) hours as needed for mild pain or fever. (Patient not taking: Reported on 09/03/2021) 120 mL 0     ROS: Unable unable to obtain due to his mental disability  Exam: Blood pressure (!) 152/108, pulse (!) 107, temperature 98.2 F (36.8 C), temperature source Oral, resp. rate 18, SpO2 94 %. General: No acute distress Orientation: Awake and alert Mood and Affect: Cooperative and  pleasant Gait: Unable to assess Coordination and balance: Unable to fully assess  Left lower extremity: Splint is in place it is clean dry and intact.  There are abrasions to his left knee.  Compartments are soft compressible.  He has active movement of his toes although he is not able to follow commands.  I was not able to provide a sensory exam.  He has warm well-perfused toes with brisk cap refill.  Right lower extremity: Skin without lesions. No tenderness to palpation. Full painless ROM, full strength in each muscle groups without evidence of instability.   Medical Decision Making: Data: Imaging: 2 views of the left tibia and ankle are reviewed which shows a spiral distal tibial third shaft fracture with associated fibula fracture.  No signs of any intra-articular extension both proximally or distally  Labs:  Results for orders placed or performed during the hospital encounter of 09/03/21 (from the past 24 hour(s))  Resp Panel by RT-PCR (Flu A&B, Covid) Nasopharyngeal Swab     Status: None   Collection Time: 09/03/21  6:36 PM   Specimen: Nasopharyngeal Swab; Nasopharyngeal(NP) swabs in vial transport medium  Result Value Ref Range   SARS Coronavirus 2 by RT PCR NEGATIVE NEGATIVE   Influenza A by PCR NEGATIVE NEGATIVE   Influenza B by PCR NEGATIVE NEGATIVE  CBC with Differential     Status: Abnormal   Collection Time: 09/03/21  6:36 PM  Result Value Ref Range   WBC 9.5 4.0 - 10.5 K/uL   RBC 4.39 4.22 - 5.81 MIL/uL   Hemoglobin 13.5 13.0 - 17.0 g/dL   HCT 00.1 74.9 - 44.9 %   MCV 90.7 80.0 - 100.0 fL   MCH 30.8 26.0 - 34.0 pg   MCHC 33.9 30.0 - 36.0 g/dL   RDW 67.5 91.6 - 38.4 %   Platelets 124 (L) 150 - 400 K/uL   nRBC 0.0 0.0 - 0.2 %   Neutrophils Relative % 74 %   Neutro Abs 7.1 1.7 - 7.7 K/uL   Lymphocytes Relative 15 %   Lymphs Abs 1.4 0.7 - 4.0 K/uL   Monocytes Relative 7 %   Monocytes Absolute 0.6 0.1 - 1.0 K/uL   Eosinophils Relative 3 %   Eosinophils Absolute 0.3  0.0 - 0.5 K/uL   Basophils Relative 0 %   Basophils Absolute 0.0 0.0 - 0.1 K/uL   Immature Granulocytes 1 %   Abs Immature Granulocytes 0.05 0.00 - 0.07 K/uL  Comprehensive metabolic panel     Status: Abnormal   Collection Time: 09/03/21  6:36 PM  Result Value Ref Range   Sodium 138 135 - 145 mmol/L   Potassium 3.9 3.5 - 5.1 mmol/L   Chloride 108 98 - 111 mmol/L   CO2 22 22 - 32 mmol/L   Glucose, Bld 97 70 - 99 mg/dL   BUN 18 6 - 20 mg/dL   Creatinine, Ser 6.65 0.61 - 1.24 mg/dL   Calcium 8.1 (L)  8.9 - 10.3 mg/dL   Total Protein 6.7 6.5 - 8.1 g/dL   Albumin 3.8 3.5 - 5.0 g/dL   AST 18 15 - 41 U/L   ALT 22 0 - 44 U/L   Alkaline Phosphatase 87 38 - 126 U/L   Total Bilirubin 0.5 0.3 - 1.2 mg/dL   GFR, Estimated >35 >57 mL/min   Anion gap 8 5 - 15  Type and screen [facility]     Status: None   Collection Time: 09/04/21  6:15 AM  Result Value Ref Range   ABO/RH(D) A POS    Antibody Screen NEG    Sample Expiration      09/07/2021,2359 Performed at [facility], [address] [place], [place] [address]      Imaging or Labs ordered: None  Medical history and chart was reviewed and case discussed with medical provider.  Assessment/Plan: 36 year old male that presents with a left tibial shaft fracture and fibular fracture.  Due to the unstable nature of his injury I recommend proceeding with intramedullary nailing.  Risks and benefits were discussed with the patient's father.  Risks include but not limited to bleeding, infection, malunion, nonunion, hardware failure, hardware irritation, nerve or blood vessel injury, DVT, even the possible anesthetic complications.  He agreed to proceed with surgery and consent was obtained  Roby Lofts, MD [facility] [phone] (office) [email or web address]

## 2021-09-05 ENCOUNTER — Inpatient Hospital Stay (HOSPITAL_COMMUNITY): Payer: Medicare Other

## 2021-09-05 ENCOUNTER — Encounter (HOSPITAL_COMMUNITY): Payer: Self-pay | Admitting: Student

## 2021-09-05 DIAGNOSIS — F79 Unspecified intellectual disabilities: Secondary | ICD-10-CM | POA: Diagnosis not present

## 2021-09-05 DIAGNOSIS — J189 Pneumonia, unspecified organism: Secondary | ICD-10-CM

## 2021-09-05 DIAGNOSIS — A419 Sepsis, unspecified organism: Secondary | ICD-10-CM | POA: Diagnosis not present

## 2021-09-05 DIAGNOSIS — S8292XA Unspecified fracture of left lower leg, initial encounter for closed fracture: Secondary | ICD-10-CM | POA: Diagnosis not present

## 2021-09-05 LAB — VITAMIN D 25 HYDROXY (VIT D DEFICIENCY, FRACTURES): Vit D, 25-Hydroxy: 28.44 ng/mL — ABNORMAL LOW (ref 30–100)

## 2021-09-05 MED ORDER — SENNOSIDES-DOCUSATE SODIUM 8.6-50 MG PO TABS
1.0000 | ORAL_TABLET | Freq: Two times a day (BID) | ORAL | Status: DC
  Administered 2021-09-05 – 2021-09-10 (×11): 1 via ORAL
  Filled 2021-09-05 (×11): qty 1

## 2021-09-05 MED ORDER — IPRATROPIUM-ALBUTEROL 0.5-2.5 (3) MG/3ML IN SOLN: 3.0000 mL | Freq: Four times a day (QID) | RESPIRATORY_TRACT | Status: DC | PRN

## 2021-09-05 MED ORDER — SODIUM CHLORIDE 0.9 % IV SOLN
1.0000 g | INTRAVENOUS | Status: DC
  Administered 2021-09-05: 1 g via INTRAVENOUS
  Filled 2021-09-05: qty 10

## 2021-09-05 MED ORDER — IOHEXOL 350 MG/ML SOLN
150.0000 mL | Freq: Once | INTRAVENOUS | Status: AC | PRN
  Administered 2021-09-05: 150 mL via INTRAVENOUS

## 2021-09-05 MED ORDER — SODIUM CHLORIDE 0.9 % IV SOLN
2.0000 g | INTRAVENOUS | Status: DC
  Administered 2021-09-06 – 2021-09-10 (×5): 2 g via INTRAVENOUS
  Filled 2021-09-05 (×5): qty 20

## 2021-09-05 MED ORDER — SODIUM CHLORIDE 0.9 % IV SOLN
500.0000 mg | INTRAVENOUS | Status: DC
  Administered 2021-09-05 – 2021-09-06 (×2): 500 mg via INTRAVENOUS
  Filled 2021-09-05 (×3): qty 5

## 2021-09-05 MED ORDER — VITAMIN D 25 MCG (1000 UNIT) PO TABS
1000.0000 [IU] | ORAL_TABLET | Freq: Every day | ORAL | Status: DC
  Administered 2021-09-05 – 2021-09-10 (×6): 1000 [IU] via ORAL
  Filled 2021-09-05 (×6): qty 1

## 2021-09-05 NOTE — Care Management Important Message (Signed)
Important Message  Patient Details  Name: Julian Gray MRN: [id number] Date of Birth: Sep 24, 1985   Medicare Important Message Given:  Yes     Hannah Beat 09/05/2021, 1:11 PM

## 2021-09-05 NOTE — Progress Notes (Signed)
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  In the ED x-ray of the left knee was negative for acute fracture.  Left tibia/fibula x-ray showed distal tibia/fibular shaft fractures, mildly displaced.  Orthopedic surgery was consulted.  Patient admitted for further management.    Yesterday evening postsurgery, patient was noted to be tachycardic, hypoxic, febrile.  Sepsis work-up was ordered.  This a.m., patient still requiring O2, still tachycardic but appears comfortable.  Noted to have bumped his L knee on the walker during PT/OT and had some drainage through dressing.  Discussed extensively with father/legal guardian at bedside.  Assessment/Plan: Principal Problem:   Closed fracture of left lower extremity, initial encounter Active Problems:   Intellectual disability   Left distal tibial shaft fracture, mildly displaced S/p intramedullary nailing of left tibia fracture on 09/04/2021 Further management including pain management, DVT PPx as per orthopedics PT/OT  Possible sepsis likely 2/2 CAP Noted to be tachycardic, tachypneic, febrile with leukocytosis noted post op Father reported some mild cough PTA to the hospital Currently afebrile, with leukocytosis UA/UC, yet to be collected [facility] x2 NGTD CXR showed multifocal bilateral airspace disease, favoring pneumonia CTA chest pending due to persistent tachycardia, elevated D-dimer (all could be noted post op) Started on ceftriaxone, azithromycin Monitor closely on telemetry  Acute hypoxic respiratory failure Likely 2/2 above Vs post op, r/o ?PE Required as high as 6L of O2 post op, not on any home O2 Currently on about 2  L of O2 saturating well above 90%, plan to wean off Management as above, duo nebs, incentive spirometry if able CT chest pending Supplemental O2 as needed  Persistent tachycardia Could be related to post op pain post, anxiety Vs sepsis Elevated d-dimer (recent surgery) Continue gentle IV fluids CTA chest pending Telemetry  Intellectual disability/2 seizure disorder Continue clozapine, oxcarbazepine, hydroxyzine as needed  Obesity Lifestyle modification advised     Estimated body mass index is 30.34 kg/m as calculated from the following:   Height as of this encounter: 6\' 1"  (1.854 m).   Weight as of this encounter: 104.3 kg.     Code Status: Full  Family Communication: Discussed extensively with father/legal guardian at bedside  Disposition Plan: Status is: Inpatient Remains inpatient appropriate because: Level of care    Consultants: Orthopedics  Procedures: S/p intramedullary nailing of left tibia fracture on 09/04/2021  Antimicrobials: Ceftriaxone Azithromycin  DVT prophylaxis: ASA twice daily, SCD   Objective: Vitals:   09/04/21 2225 09/05/21 0033 09/05/21 0457 09/05/21 0841  BP: 122/90 115/69 117/73 131/82  Pulse: (!) 116 (!) 113 100   Resp: 17 17 18    Temp: 98.1 F (36.7 C) 98.8 F (37.1 C)  98.3 F (36.8 C)  TempSrc: Axillary Axillary  Axillary  SpO2: 95% 98% 98%   Weight:      Height:        Intake/Output Summary (Last 24 hours) at 09/05/2021 1308 Last data filed at 09/05/2021 [id number] Gross per 24 hour  Intake 507.18 ml  Output 0 ml  Net 507.18 ml   Filed Weights   09/04/21 0929  Weight: 104.3 kg    Exam: General: NAD, nonverbal, appears comfortable Cardiovascular: S1, S2 present Respiratory: CTAB Abdomen: Soft, nontender, nondistended, bowel sounds  present Musculoskeletal: LLE wrapped in Ace wrap, RLE no edema noted Skin: Normal Psychiatry: Unable to assess    Data Reviewed: CBC: Recent Labs  Lab 09/03/21 1836 09/04/21 1936   WBC 9.5 12.0*  NEUTROABS 7.1 9.5*  HGB 13.5 14.4  HCT 39.8 42.1  MCV 90.7 87.5  PLT 124* [id number]   Basic Metabolic Panel: Recent Labs  Lab 09/03/21 1836 09/04/21 1936  NA 138 136  K 3.9 4.0  CL 108 101  CO2 22 24  GLUCOSE 97 163*  BUN 18 16  CREATININE 0.75 1.13  CALCIUM 8.1* 9.1   GFR: Estimated Creatinine Clearance: 118 mL/min (by C-G formula based on SCr of 1.13 mg/dL). Liver Function Tests: Recent Labs  Lab 09/03/21 1836  AST 18  ALT 22  ALKPHOS 87  BILITOT 0.5  PROT 6.7  ALBUMIN 3.8   No results for input(s): LIPASE, AMYLASE in the last 168 hours. No results for input(s): AMMONIA in the last 168 hours. Coagulation Profile: No results for input(s): INR, PROTIME in the last 168 hours. Cardiac Enzymes: No results for input(s): CKTOTAL, CKMB, CKMBINDEX, TROPONINI in the last 168 hours. BNP (last 3 results) No results for input(s): PROBNP in the last 8760 hours. HbA1C: No results for input(s): HGBA1C in the last 72 hours. CBG: No results for input(s): GLUCAP in the last 168 hours. Lipid Profile: No results for input(s): CHOL, HDL, LDLCALC, TRIG, CHOLHDL, LDLDIRECT in the last 72 hours. Thyroid Function Tests: No results for input(s): TSH, T4TOTAL, FREET4, T3FREE, THYROIDAB in the last 72 hours. Anemia Panel: No results for input(s): VITAMINB12, FOLATE, FERRITIN, TIBC, IRON, RETICCTPCT in the last 72 hours. Urine analysis:    Component Value Date/Time   COLORURINE YELLOW 09/01/2019 2054   APPEARANCEUR CLOUDY (A) 09/01/2019 2054   LABSPEC 1.024 09/01/2019 2054   PHURINE 8.0 09/01/2019 2054   GLUCOSEU NEGATIVE 09/01/2019 2054   HGBUR NEGATIVE 09/01/2019 2054   BILIRUBINUR NEGATIVE 09/01/2019 2054   [place] NEGATIVE 09/01/2019 2054   PROTEINUR NEGATIVE 09/01/2019 2054   NITRITE NEGATIVE 09/01/2019 2054   LEUKOCYTESUR NEGATIVE 09/01/2019 2054   Sepsis Labs: @LABRCNTIP (procalcitonin:4,lacticidven:4)  ) Recent Results (from the past 240 hour(s))  Resp  Panel by RT-PCR (Flu A&B, Covid) Nasopharyngeal Swab     Status: None   Collection Time: 09/03/21  6:36 PM   Specimen: Nasopharyngeal Swab; Nasopharyngeal(NP) swabs in vial transport medium  Result Value Ref Range Status   SARS Coronavirus 2 by RT PCR NEGATIVE NEGATIVE Final    Comment: (NOTE) SARS-CoV-2 target nucleic acids are NOT DETECTED.  The SARS-CoV-2 RNA is generally detectable in upper respiratory specimens during the acute phase of infection. The lowest concentration of SARS-CoV-2 viral copies this assay can detect is 138 copies/mL. A negative result does not preclude SARS-Cov-2 infection and should not be used as the sole basis for treatment or other patient management decisions. A negative result may occur with  improper specimen collection/handling, submission of specimen other than nasopharyngeal swab, presence of viral mutation(s) within the areas targeted by this assay, and inadequate number of viral copies(<138 copies/mL). A negative result must be combined with clinical observations, patient history, and epidemiological information. The expected result is Negative.  Fact Sheet for Patients:  EntrepreneurPulse.com.au  Fact Sheet for Healthcare Providers:  IncredibleEmployment.be  This test is no t yet approved or cleared by the [place] FDA and  has been authorized for detection and/or diagnosis of SARS-CoV-2 by FDA under an Emergency Use Authorization (EUA). This EUA will remain  in effect (meaning this test can be used) for the duration of the COVID-19 declaration under Section 564(b)(1) of the Act, 21 U.S.C.section 360bbb-3(b)(1), unless the authorization is terminated  or revoked sooner.       Influenza A by PCR NEGATIVE NEGATIVE Final   Influenza B by PCR NEGATIVE NEGATIVE Final    Comment: (NOTE) The Xpert Xpress SARS-CoV-2/FLU/RSV plus assay is intended as an aid in the diagnosis of influenza from Nasopharyngeal  swab specimens and should not be used as a sole basis for treatment. Nasal washings and aspirates are unacceptable for Xpert Xpress SARS-CoV-2/FLU/RSV testing.  Fact Sheet for Patients: BloggerCourse.comhttps://www.fda.gov/media/152166/download  Fact Sheet for Healthcare Providers: SeriousBroker.ithttps://www.fda.gov/media/152162/download  This test is not yet approved or cleared by the [place] FDA and has been authorized for detection and/or diagnosis of SARS-CoV-2 by FDA under an Emergency Use Authorization (EUA). This EUA will remain in effect (meaning this test can be used) for the duration of the COVID-19 declaration under Section 564(b)(1) of the Act, 21 U.S.C. section 360bbb-3(b)(1), unless the authorization is terminated or revoked.  Performed at [facility], [address] [place], [place] [address]   Culture, blood (routine x 2)     Status: None (Preliminary result)   Collection Time: 09/04/21  7:35 PM   Specimen: BLOOD RIGHT HAND  Result Value Ref Range Status   Specimen Description BLOOD RIGHT HAND  Final   Special Requests   Final    BOTTLES DRAWN AEROBIC AND ANAEROBIC Blood Culture adequate volume   Culture   Final    NO GROWTH < 24 HOURS Performed at [facility], [address] [place], [place] [address]    Report Status PENDING  Incomplete  Culture, blood (routine x 2)     Status: None (Preliminary result)   Collection Time: 09/04/21  7:43 PM   Specimen: BLOOD  Result Value Ref Range Status   Specimen Description BLOOD LEFT ANTECUBITAL  Final   Special Requests   Final    BOTTLES DRAWN AEROBIC AND ANAEROBIC Blood Culture adequate volume   Culture   Final    NO GROWTH < 24 HOURS Performed at [facility], [address] [place], [place] [address]    Report Status PENDING  Incomplete      Studies: DG Chest Port 1 View  Result Date: 09/04/2021 CLINICAL DATA:  Fever EXAM: PORTABLE CHEST 1 VIEW COMPARISON:  09/11/2019 FINDINGS: Single frontal  view of the chest demonstrates a stable cardiac silhouette. Interval removal of the esophageal stent. There is multifocal bilateral airspace disease in a perihilar distribution. No effusion or pneumothorax. No acute bony abnormalities. IMPRESSION: 1. Multifocal bilateral airspace disease, favor pneumonia given clinical history. Electronically Signed   By: Sharlet SalinaMichael  Brown M.D.   On: 09/04/2021 18:07   DG Tibia/Fibula Left Port  Result Date: 09/04/2021 CLINICAL DATA:  Left tibia ORIF. EXAM: PORTABLE LEFT TIBIA AND FIBULA - 2 VIEW COMPARISON:  Left tibia and fibula x-rays from yesterday. FINDINGS: Interval intramedullary rod fixation of the comminuted distal tibial diaphyseal fracture, now in near anatomic alignment. Improved alignment of the comminuted mid to distal fibular diaphyseal fracture with residual 5 mm posterior displacement. Diffuse soft tissue swelling. Small amount of postsurgical intra-articular air within the knee joint. IMPRESSION: 1. Interval ORIF of the distal tibial diaphyseal fracture, now in near anatomic alignment. 2. Improved alignment of the comminuted mid to distal fibular diaphyseal fracture. Electronically Signed   By: Vickki HearingWilliam T Derry M.D.  On: 09/04/2021 13:59    Scheduled Meds:  aspirin  325 mg Oral BID   celecoxib  200 mg Oral BID   cholecalciferol  1,000 Units Oral Daily   clozapine  50 mg Oral TID   docusate sodium  100 mg Oral BID   oxcarbazepine  600 mg Oral BID    Continuous Infusions:  sodium chloride 75 mL/hr at 09/04/21 2021   azithromycin 500 mg (09/05/21 1054)   [START ON 09/06/2021] cefTRIAXone (ROCEPHIN)  IV     methocarbamol (ROBAXIN) IV       LOS: 2 days     Alma Friendly, MD [facility]  If 7PM-7AM, please contact night-coverage [email or web address] 09/05/2021, 1:08 PM

## 2021-09-05 NOTE — TOC CAGE-AID Note (Signed)
Transition of Care [facility]) - CAGE-AID Screening   Patient Details  Name: Julian Gray MRN: [id number] Date of Birth: Jun 24, 1986  Transition of [facility]) CM/SW Contact:    Ravis Herne C Tarpley-Carter, [place] Phone Number: 09/05/2021, 8:51 AM   Clinical Narrative: Pt is unable to participate in Cage Aid. Pt is not appropriate for assessment.  Rosalio Catterton Tarpley-Carter, MSW, LCSW-A Pronouns:  She/Her/[place] Transitions of Care Clinical Social Worker Direct Number:  [phone] [email or web address]  CAGE-AID Screening: Substance Abuse Screening unable to be completed due to: : Patient unable to participate (Pt is not appropriate for assessment.)             Substance Abuse Education Offered: No

## 2021-09-05 NOTE — Evaluation (Signed)
Occupational Therapy Evaluation Patient Details Name: Julian Gray MRN: [id number] DOB: [birth year] Today's Date: 09/05/2021   History of Present Illness Pt is a 36 y.o. male admitted 09/03/21 after twisting his leg in parking lot sustaining L tibial shaft and fibula fx. S/p L tibial IMN 2/2. 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   Clinical Impression   Pt lives with his parents and is assisted by either his parents or his sister with all ADLs and IADLs. He walks with unstable gait per his dad and has had many falls. He likes to dance and does not fall when he dances. Pt is dependent in all ADL. He stood with RW with +2 min assist, but without required +2 max assist to pivot to chair with inability to step with L LE. Will follow acutely to address transfers for toileting.      Recommendations for follow up therapy are one component of a multi-disciplinary discharge planning process, led by the attending physician.  Recommendations may be updated based on patient status, additional functional criteria and insurance authorization.   Follow Up Recommendations  No OT follow up    Assistance Recommended at Discharge Frequent or constant Supervision/Assistance  Patient can return home with the following Two people to help with walking and/or transfers;Two people to help with bathing/dressing/bathroom;Assistance with cooking/housework;Assist for transportation;Help with stairs or ramp for entrance    Functional Status Assessment  Patient has had a recent decline in their functional status and/or demonstrates limited ability to make significant improvements in function in a reasonable and predictable amount of time  Equipment Recommendations       Recommendations for Other Services       Precautions / Restrictions Precautions Precautions: Fall Required Braces or Orthoses: Other Brace Other Brace: L  CAM boot Restrictions Weight Bearing Restrictions: Yes LLE Weight Bearing: Weight bearing as tolerated      Mobility Bed Mobility Overal bed mobility: Needs Assistance Bed Mobility: Supine to Sit     Supine to sit: Min assist     General bed mobility comments: pulled up on therapist's hand    Transfers Overall transfer level: Needs assistance Equipment used: Rolling walker (2 wheels) Transfers: Sit to/from Stand, Bed to chair/wheelchair/BSC Sit to Stand: Min assist, From elevated surface, +2 physical assistance Stand pivot transfers: +2 physical assistance, Max assist         General transfer comment: stood with RW from elevated bed with hands on walker with + 2 min assist to rise and steady, pushing walker away so stood with B hand held assist and pivoted to chair with +2 max assist, unable to advance L LE      Balance Overall balance assessment: Needs assistance   Sitting balance-Leahy Scale: Good       Standing balance-Leahy Scale: Poor                             ADL either performed or assessed with clinical judgement   ADL Overall ADL's : At baseline                                             Vision Baseline Vision/History: 0 No visual deficits       Perception     Praxis  Pertinent Vitals/Pain Pain Assessment Pain Assessment: Faces Faces Pain Scale: Hurts a little bit Pain Location: L LE Pain Descriptors / Indicators: Guarding, Discomfort Pain Intervention(s): Monitored during session, Repositioned     Hand Dominance     Extremity/Trunk Assessment Upper Extremity Assessment Upper Extremity Assessment: Overall WFL for tasks assessed (ataxia)   Lower Extremity Assessment Lower Extremity Assessment: Defer to PT evaluation   Cervical / Trunk Assessment Cervical / Trunk Assessment: Normal   Communication Communication Communication: Expressive difficulties   Cognition Arousal/Alertness:  Awake/alert Behavior During Therapy: WFL for tasks assessed/performed Overall Cognitive Status: History of cognitive impairments - at baseline                                 General Comments: pt understands simple commands when given multimodal cues, reliable yes/no when asked if he wanted juice, dad provided PLOF and home information     General Comments       Exercises     Shoulder Instructions      Home Living Family/patient expects to be discharged to:: Private residence Living Arrangements: Parent (mom and dad) Available Help at Discharge: Family;Available 24 hours/day Type of Home: House Home Access: Stairs to enter [employer] of Steps: 2-3 Entrance Stairs-Rails: Right Home Layout: Two level;1/2 bath on main level Alternate Level Stairs-Number of Steps: flight   Bathroom Shower/Tub: Walk-in shower;Other (comment) (upstairs)   Bathroom Toilet: Handicapped height     Home Equipment: Rolling Walker (2 wheels);BSC/3in1;Grab bars - toilet;Grab bars - tub/shower;Wheelchair - manual   Additional Comments: dad thinks they have DME left over from his deceased mother in law      Prior Functioning/Environment Prior Level of Function : Needs assist             Mobility Comments: unsteady gait with many falls, but does not use AD ADLs Comments: Assisted for all ADLs and IADLs, including feeding.        OT Problem List: Impaired balance (sitting and/or standing);Decreased cognition;Decreased knowledge of use of DME or AE      OT Treatment/Interventions: DME and/or AE instruction;Balance training;Patient/family education    OT Goals(Current goals can be found in the care plan section) Acute Rehab OT Goals OT Goal Formulation: With patient/family Time For Goal Achievement: 09/19/21 Potential to Achieve Goals: Good ADL Goals Pt Will Transfer to Toilet: with mod assist;ambulating;bedside commode (over toilet)  OT Frequency: Min 2X/week     Co-evaluation PT/OT/SLP Co-Evaluation/Treatment: Yes Reason for Co-Treatment: For patient/therapist safety;Necessary to address cognition/behavior during functional activity   OT goals addressed during session: ADL's and self-care      AM-PAC OT "6 Clicks" Daily Activity     Outcome Measure Help from another person eating meals?: A Lot Help from another person taking care of personal grooming?: A Lot Help from another person toileting, which includes using toliet, bedpan, or urinal?: Total Help from another person bathing (including washing, rinsing, drying)?: Total Help from another person to put on and taking off regular upper body clothing?: Total Help from another person to put on and taking off regular lower body clothing?: Total 6 Click Score: 8   End of Session Equipment Utilized During Treatment: Gait belt;Rolling walker (2 wheels) Nurse Communication: Other (comment) (L LE bleeding)  Activity Tolerance: Patient tolerated treatment well Patient left: in chair;with call bell/phone within reach;with chair alarm set;with family/visitor present  OT Visit Diagnosis: Unsteadiness on feet (R26.81)  Time: [id number] OT Time Calculation (min): 22 min Charges:  OT General Charges $OT Visit: 1 Visit OT Evaluation $OT Eval Moderate Complexity: 1 Mod  Nestor Lewandowsky, OTR/L Acute Rehabilitation Services Pager: [phone] Office: [phone]   Malka So 09/05/2021, 10:30 AM

## 2021-09-05 NOTE — Evaluation (Signed)
Physical Therapy Evaluation Patient Details Name: Julian Gray MRN: [id number] DOB: 31-Jul-1986 Today's Date: 09/05/2021  History of Present Illness  Pt is a 36 y.o. male admitted 09/03/21 after twisting his leg in parking lot sustaining L tibial shaft and fibula fx. S/p L tibial IMN 2/2. 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   Clinical Impression  Pt presents with an overall decrease in functional mobility secondary to above. PTA, pt's father reports that pt is ambulatory with unstable gait and multiple falls; family assists with all ADL/iADLs; pt takes dance class and never falls when dancing. Today, pt requiring minA-maxA+2 for standing and transfers. Educ pt and father re: LLE precautions, positioning, DVT prevention, activity recommendations, DME needs; expect pt most safe with wheelchair-level mobility and +2 assist for transfers. Pt's father is confident family can provide necessary assist. Pt would benefit from continued acute PT services to maximize functional mobility and independence prior to d/c with HHPT services.      SpO2 89-92% on RA HR 110s-132   Recommendations for follow up therapy are one component of a multi-disciplinary discharge planning process, led by the attending physician.  Recommendations may be updated based on patient status, additional functional criteria and insurance authorization.  Follow Up Recommendations Home health PT    Assistance Recommended at Discharge Frequent or constant Supervision/Assistance  Patient can return home with the following  Two people to help with walking and/or transfers;A lot of help with bathing/dressing/bathroom;Assistance with cooking/housework;Assist for transportation;Help with stairs or ramp for entrance    Equipment Recommendations None recommended by PT  Recommendations for Other Services       Functional Status Assessment  Patient has had a recent decline in their functional status and demonstrates the ability to make significant improvements in function in a reasonable and predictable amount of time.     Precautions / Restrictions Precautions Precautions: Fall Required Braces or Orthoses: Other Brace Other Brace: L CAM boot Restrictions Weight Bearing Restrictions: Yes LLE Weight Bearing: Weight bearing as tolerated Other Position/Activity Restrictions: in cam boot      Mobility  Bed Mobility Overal bed mobility: Needs Assistance       Supine to sit: Min assist, HOB elevated     General bed mobility comments: MinA for HHA as pt reaching for UE support to elevate trunk; able to scoot hips to EOB without assist    Transfers Overall transfer level: Needs assistance Equipment used: Rolling walker (2 wheels), 2 person hand held assist Transfers: Sit to/from Stand Sit to Stand: Min assist, +2 physical assistance, From elevated surface Stand pivot transfers: Max assist, +2 physical assistance         General transfer comment: Initial stand from elevated bed to RW, pt pulling with BUEs on walker, reliant on momentum and cues to power up, minA+2 for trunk elevation and stability; LOB back to sitting and pt pushing away RW, reaching for HHA from dad; able to stand and pivot from bed to recliner with maxA+2 for BUE support and stability, unable to advanced LLE or accept weight onto LLE in order to take step with RLE    Ambulation/Gait                  Stairs            Wheelchair Mobility    Modified Rankin (Stroke Patients Only)       Balance Overall balance assessment: Needs assistance Sitting-balance support:  No upper extremity supported Sitting balance-Leahy Scale: Good     Standing balance support: Bilateral upper extremity supported, During functional activity Standing balance-Leahy Scale: Poor Standing balance comment: reliant on BUE support and external assist;  static stability improved with RW support, but pt seems fearful of RW                             Pertinent Vitals/Pain Pain Assessment Pain Assessment: Faces Faces Pain Scale: Hurts little more Pain Location: L LE Pain Descriptors / Indicators: Guarding, Discomfort Pain Intervention(s): Monitored during session, Limited activity within patient's tolerance, Repositioned    Home Living Family/patient expects to be discharged to:: Private residence Living Arrangements: Parent Available Help at Discharge: Family;Available 24 hours/day Type of Home: House Home Access: Stairs to enter Entrance Stairs-Rails: Right Entrance Stairs-Number of Steps: 2-3 Alternate Level Stairs-Number of Steps: flight Home Layout: Two level;1/2 bath on main level;Able to live on main level with bedroom/bathroom ("he's used to using the half bath on the main level") Home Equipment: Rolling Walker (2 wheels);BSC/3in1;Grab bars - toilet;Grab bars - tub/shower;Wheelchair - manual Additional Comments: Lives with parents; sister is licensed caregiver who assists as well. Dad thinks they have DME left over from his deceased mother in law    Prior Function Prior Level of Function : Needs assist             Mobility Comments: unsteady gait with many falls, but does not use AD ADLs Comments: Assisted for all ADLs and IADLs, including feeding     Hand Dominance        Extremity/Trunk Assessment   Upper Extremity Assessment Upper Extremity Assessment: Overall WFL for tasks assessed;Difficult to assess due to impaired cognition (ataxic-like movements)    Lower Extremity Assessment Lower Extremity Assessment: LLE deficits/detail LLE Deficits / Details: s/p L tibial IMN; dad reports baseline LLE deformity with toe pointed/rotated inward and "knee caves in"    Cervical / Trunk Assessment Cervical / Trunk Assessment: Normal  Communication   Communication: Expressive difficulties  Cognition  Arousal/Alertness: Awake/alert Behavior During Therapy: WFL for tasks assessed/performed Overall Cognitive Status: History of cognitive impairments - at baseline                                 General Comments: pt understands simple commands when given multimodal cues; reliable yes/no when asked if he wanted juice; dad provided PLOF and home information. Pt pleasant and participatory; able to make needs known majority of session        General Comments General comments (skin integrity, edema, etc.): Increased time discussing safe d/c recommendations with pt's father - including DME needs, fall risk reduction, activity recommendations, DVT prevention, edema control. Suspect pt will initially be reliant on +2 assist for w/c-level transfers due to pain and instability standing on LLE. Pt's father reports necessary assist and DME available    Exercises     Assessment/Plan    PT Assessment Patient needs continued PT services  PT Problem List Decreased strength;Decreased activity tolerance;Decreased balance;Decreased mobility;Decreased coordination;Decreased cognition;Decreased knowledge of use of DME;Decreased safety awareness;Decreased knowledge of precautions;Cardiopulmonary status limiting activity;Pain       PT Treatment Interventions DME instruction;Gait training;Functional mobility training;Therapeutic activities;Therapeutic exercise;Balance training;Cognitive remediation;Patient/family education;Wheelchair mobility training    PT Goals (Current goals can be found in the Care Plan section)  Acute Rehab PT Goals Patient Stated Goal: return  home with family assist PT Goal Formulation: With patient/family Time For Goal Achievement: 09/19/21 Potential to Achieve Goals: Good    Frequency Min 5X/week     Co-evaluation PT/OT/SLP Co-Evaluation/Treatment: Yes Reason for Co-Treatment: Necessary to address cognition/behavior during functional activity;For patient/therapist  safety;To address functional/ADL transfers PT goals addressed during session: Mobility/safety with mobility;Balance;Proper use of DME OT goals addressed during session: ADL's and self-care       AM-PAC PT "6 Clicks" Mobility  Outcome Measure Help needed turning from your back to your side while in a flat bed without using bedrails?: None Help needed moving from lying on your back to sitting on the side of a flat bed without using bedrails?: None Help needed moving to and from a bed to a chair (including a wheelchair)?: A Lot Help needed standing up from a chair using your arms (e.g., wheelchair or bedside chair)?: A Lot Help needed to walk in hospital room?: Total Help needed climbing 3-5 steps with a railing? : Total 6 Click Score: 14    End of Session Equipment Utilized During Treatment: Gait belt Activity Tolerance: Patient tolerated treatment well Patient left: in chair;with call bell/phone within reach;with chair alarm set;with family/visitor present Nurse Communication: Mobility status PT Visit Diagnosis: Other abnormalities of gait and mobility (R26.89);Pain Pain - Right/Left: Left Pain - part of body: Leg;Ankle and joints of foot    Time: [id number] PT Time Calculation (min) (ACUTE ONLY): 24 min   Charges:   PT Evaluation $PT Eval Moderate Complexity: 1 Mod        Mabeline Caras, PT, DPT Acute Rehabilitation Services  Pager [phone] Office [place] 09/05/2021, 12:50 PM

## 2021-09-05 NOTE — Discharge Instructions (Signed)
[facility] Discharge Instructions   General Discharge Instructions  WEIGHT BEARING STATUS: Weightbearing as tolerated left lower extremity  RANGE OF MOTION/ACTIVITY: Ok to come out of the CAM boot to start some gentle ankle range of motion. Ok to come out of boot at rest. Wear CAM boot when up out of bed.  Wound Care:You may remove your surgical dressing. Leave the steri-strips in place.  Incisions can be left open to air if there is no drainage. If incision continues to have drainage, follow wound care instructions below. Okay to shower if no drainage from incisions.  DVT/PE prophylaxis: Aspirin 81 mg  Diet: as you were eating previously.  Can use over the counter stool softeners and bowel preparations, such as Miralax, to help with bowel movements.  Narcotics can be constipating.  Be sure to drink plenty of fluids  PAIN MEDICATION USE AND EXPECTATIONS  You have likely been given narcotic medications to help control your pain.  After a traumatic event that results in an fracture (broken bone) with or without surgery, it is ok to use narcotic pain medications to help control one's pain.  We understand that everyone responds to pain differently and each individual patient will be evaluated on a regular basis for the continued need for narcotic medications. Ideally, narcotic medication use should last no more than 6-8 weeks (coinciding with fracture healing).   As a patient it is your responsibility as well to monitor narcotic medication use and report the amount and frequency you use these medications when you come to your office visit.   We would also advise that if you are using narcotic medications, you should take a dose prior to therapy to maximize you participation.  IF YOU ARE ON NARCOTIC MEDICATIONS IT IS NOT PERMISSIBLE TO OPERATE A MOTOR VEHICLE (MOTORCYCLE/CAR/TRUCK/MOPED) OR HEAVY MACHINERY DO NOT MIX NARCOTICS WITH OTHER CNS (CENTRAL NERVOUS SYSTEM) DEPRESSANTS  SUCH AS ALCOHOL   STOP SMOKING OR USING NICOTINE PRODUCTS!!!!  As discussed nicotine severely impairs your body's ability to heal surgical and traumatic wounds but also impairs bone healing.  Wounds and bone heal by forming microscopic blood vessels (angiogenesis) and nicotine is a vasoconstrictor (essentially, shrinks blood vessels).  Therefore, if vasoconstriction occurs to these microscopic blood vessels they essentially disappear and are unable to deliver necessary nutrients to the healing tissue.  This is one modifiable factor that you can do to dramatically increase your chances of healing your injury.    (This means no smoking, no nicotine gum, patches, etc)  DO NOT USE NONSTEROIDAL ANTI-INFLAMMATORY DRUGS (NSAID'S)  Using products such as Advil (ibuprofen), Aleve (naproxen), Motrin (ibuprofen) for additional pain control during fracture healing can delay and/or prevent the healing response.  If you would like to take over the counter (OTC) medication, Tylenol (acetaminophen) is ok.  However, some narcotic medications that are given for pain control contain acetaminophen as well. Therefore, you should not exceed more than 4000 mg of tylenol in a day if you do not have liver disease.  Also note that there are may OTC medicines, such as cold medicines and allergy medicines that my contain tylenol as well.  If you have any questions about medications and/or interactions please ask your doctor/PA or your pharmacist.      ICE AND ELEVATE INJURED/OPERATIVE EXTREMITY  Using ice and elevating the injured extremity above your heart can help with swelling and pain control.  Icing in a pulsatile fashion, such as 20 minutes on and 20 minutes off,  can be followed.    Do not place ice directly on skin. Make sure there is a barrier between to skin and the ice pack.    Using frozen items such as frozen peas works well as the conform nicely to the are that needs to be iced.  USE AN ACE WRAP OR TED HOSE FOR  SWELLING CONTROL  In addition to icing and elevation, Ace wraps or TED hose are used to help limit and resolve swelling.  It is recommended to use Ace wraps or TED hose until you are informed to stop.    When using Ace Wraps start the wrapping distally (farthest away from the body) and wrap proximally (closer to the body)   Example: If you had surgery on your leg or thing and you do not have a splint on, start the ace wrap at the toes and work your way up to the thigh        If you had surgery on your upper extremity and do not have a splint on, start the ace wrap at your fingers and work your way up to the upper arm  IF YOU ARE IN A CAM BOOT (BLACK BOOT)  You may remove boot periodically. Perform daily dressing changes as noted below.  Wash the liner of the boot regularly and wear a sock when wearing the boot. It is recommended that you sleep in the boot until told otherwise   CALL THE OFFICE WITH ANY QUESTIONS OR CONCERNS: [phone]   VISIT OUR WEBSITE FOR ADDITIONAL INFORMATION: [email or web address]     Discharge Wound Care Instructions  Do NOT apply any ointments, solutions or lotions to pin sites or surgical wounds.  These prevent needed drainage and even though solutions like hydrogen peroxide kill bacteria, they also damage cells lining the pin sites that help fight infection.  Applying lotions or ointments can keep the wounds moist and can cause them to breakdown and open up as well. This can increase the risk for infection. When in doubt call the office.  If any drainage is noted, use one layer of adaptic, then gauze, Kerlix, and an ace wrap.  Once the incision is completely dry and without drainage, it may be left open to air out.  Showering may begin 36-48 hours later.  Cleaning gently with soap and water.

## 2021-09-05 NOTE — Progress Notes (Signed)
Orthopaedic Trauma Progress Note  SUBJECTIVE: Patient resting comfortably in bedside chair. Father at bedside, states patient did well with transfer to bedside chair. Bumped his knee on the walker and as a result had some drainage through dressing. Ace wrap was reinforced without issues.  OBJECTIVE:  Vitals:   09/05/21 0457 09/05/21 0841  BP: 117/73 131/82  Pulse: 100   Resp: 18   Temp:  98.3 F (36.8 C)  SpO2: 98%     General: Resting comfortably, NAD Respiratory: No increased work of breathing.  LLE: CAM boot in place. Reinforced dressing is C/D/I. Difficulty obtaining reliable motor or sensory exam. Skin warm and dry. Compartments soft and compressible.   IMAGING: Stable post op imaging.   LABS:  Results for orders placed or performed during the hospital encounter of 09/03/21 (from the past 24 hour(s))  Culture, blood (routine x 2)     Status: None (Preliminary result)   Collection Time: 09/04/21  7:35 PM   Specimen: BLOOD RIGHT HAND  Result Value Ref Range   Specimen Description BLOOD RIGHT HAND    Special Requests      BOTTLES DRAWN AEROBIC AND ANAEROBIC Blood Culture adequate volume   Culture      NO GROWTH < 24 HOURS Performed at [facility], [address], [place], [place] [address]    Report Status PENDING   Basic metabolic panel     Status: Abnormal   Collection Time: 09/04/21  7:36 PM  Result Value Ref Range   Sodium 136 135 - 145 mmol/L   Potassium 4.0 3.5 - 5.1 mmol/L   Chloride 101 98 - 111 mmol/L   CO2 24 22 - 32 mmol/L   Glucose, Bld 163 (H) 70 - 99 mg/dL   BUN 16 6 - 20 mg/dL   Creatinine, Ser 7.40 0.61 - 1.24 mg/dL   Calcium 9.1 8.9 - 81.4 mg/dL   GFR, Estimated >48 >18 mL/min   Anion gap 11 5 - 15  CBC with Differential/Platelet     Status: Abnormal   Collection Time: 09/04/21  7:36 PM  Result Value Ref Range   WBC 12.0 (H) 4.0 - 10.5 K/uL   RBC 4.81 4.22 - 5.81 MIL/uL   Hemoglobin 14.4 13.0 - 17.0 g/dL   HCT 56.3 14.9 - 70.2 %   MCV  87.5 80.0 - 100.0 fL   MCH 29.9 26.0 - 34.0 pg   MCHC 34.2 30.0 - 36.0 g/dL   RDW 63.7 85.8 - 85.0 %   Platelets 189 150 - 400 K/uL   nRBC 0.0 0.0 - 0.2 %   Neutrophils Relative % 79 %   Neutro Abs 9.5 (H) 1.7 - 7.7 K/uL   Lymphocytes Relative 16 %   Lymphs Abs 1.9 0.7 - 4.0 K/uL   Monocytes Relative 5 %   Monocytes Absolute 0.6 0.1 - 1.0 K/uL   Eosinophils Relative 0 %   Eosinophils Absolute 0.0 0.0 - 0.5 K/uL   Basophils Relative 0 %   Basophils Absolute 0.0 0.0 - 0.1 K/uL   nRBC 0 0 /100 WBC   Abs Immature Granulocytes 0.00 0.00 - 0.07 K/uL  D-dimer, quantitative     Status: Abnormal   Collection Time: 09/04/21  7:36 PM  Result Value Ref Range   D-Dimer, Quant 3.41 (H) 0.00 - 0.50 ug/mL-FEU  VITAMIN D 25 Hydroxy (Vit-D Deficiency, Fractures)     Status: Abnormal   Collection Time: 09/04/21  7:37 PM  Result Value Ref Range  Vit D, 25-Hydroxy 28.44 (L) 30 - 100 ng/mL  Culture, blood (routine x 2)     Status: None (Preliminary result)   Collection Time: 09/04/21  7:43 PM   Specimen: BLOOD  Result Value Ref Range   Specimen Description BLOOD LEFT ANTECUBITAL    Special Requests      BOTTLES DRAWN AEROBIC AND ANAEROBIC Blood Culture adequate volume   Culture      NO GROWTH < 24 HOURS Performed at [facility], [address] [place], [place] [address]    Report Status PENDING     ASSESSMENT: Julian Gray is a 36 y.o. male, 1 Day Post-Op s/p INTRAMEDULLARY NAIL LEFT TIBIA  CV/Blood loss: Hgb 14.4 on 09/04/21. Recheck tomorrow AM (09/06/21). Hemodynamically stable  PLAN: Weightbearing: WBAT LLE ROM: Ok to come out of boot at rest for ankle ROM as tolerated Incisional and dressing care: Reinforce PRN. OK to remove dressings 09/06/21 and leave open to air with dry gauze PRN  Showering: Ok to begin getting incisions wet in the shower on 09/07/21 Orthopedic device(s): CAM boot LLE when ambulating Pain management:  1. Tylenol 650 mg q 6 hours PRN 2. Robaxin 500 mg q 6  hours PRN 3. Norco 5-325 mg q 4 hours PRN moderate pain 4. Norco 7.5-325 mg q 4 hours PRN severe pain 5. Morphine 1-2 mg q 2 hours PRN breakthrough pain VTE prophylaxis: Aspirin, SCDs ID:  Ceftriaxone + Azithromycin for CAP Foley/Lines:  No foley, KVO IVFs Impediments to Fracture Healing: Vit D level 28, started on D3 supplementation Dispo: PT/OT evaluation today. Plan to change LLE dressing tomorrow.  Okay for discharge from ortho standpoint once cleared by medicine team and therapies Follow - up plan: 2 weeks after d/c for repeat x-rays and wound check  Contact information:  Truitt Merle MD, Thyra Breed PA-C. After hours and holidays please check [email or web address] for group call information for [facility]   Thompson Caul, PA-C [phone] (office) [email or web address]

## 2021-09-06 DIAGNOSIS — J189 Pneumonia, unspecified organism: Secondary | ICD-10-CM | POA: Diagnosis not present

## 2021-09-06 DIAGNOSIS — F79 Unspecified intellectual disabilities: Secondary | ICD-10-CM | POA: Diagnosis not present

## 2021-09-06 DIAGNOSIS — A419 Sepsis, unspecified organism: Secondary | ICD-10-CM | POA: Diagnosis not present

## 2021-09-06 DIAGNOSIS — S8292XA Unspecified fracture of left lower leg, initial encounter for closed fracture: Secondary | ICD-10-CM | POA: Diagnosis not present

## 2021-09-06 LAB — CBC
HCT: 32.5 % — ABNORMAL LOW (ref 39.0–52.0)
Hemoglobin: 11 g/dL — ABNORMAL LOW (ref 13.0–17.0)
MCH: 30.5 pg (ref 26.0–34.0)
MCHC: 33.8 g/dL (ref 30.0–36.0)
MCV: 90 fL (ref 80.0–100.0)
Platelets: 124 10*3/uL — ABNORMAL LOW (ref 150–400)
RBC: 3.61 MIL/uL — ABNORMAL LOW (ref 4.22–5.81)
RDW: 12.6 % (ref 11.5–15.5)
WBC: 7.7 10*3/uL (ref 4.0–10.5)
nRBC: 0 % (ref 0.0–0.2)

## 2021-09-06 LAB — BASIC METABOLIC PANEL
Anion gap: 6 (ref 5–15)
BUN: 25 mg/dL — ABNORMAL HIGH (ref 6–20)
CO2: 28 mmol/L (ref 22–32)
Calcium: 8.5 mg/dL — ABNORMAL LOW (ref 8.9–10.3)
Chloride: 105 mmol/L (ref 98–111)
Creatinine, Ser: 0.91 mg/dL (ref 0.61–1.24)
GFR, Estimated: >60 mL/min (ref >60)
Glucose, Bld: 101 mg/dL — ABNORMAL HIGH (ref 70–99)
Potassium: 3.9 mmol/L (ref 3.5–5.1)
Sodium: 139 mmol/L (ref 135–145)

## 2021-09-06 LAB — PROCALCITONIN: Procalcitonin: 1.99 ng/mL

## 2021-09-06 IMAGING — XA IR REPLACE G-TUBE/COLONIC TUBE
2 series · 2 of 2 positions shown · non-contrast
Comparison: none

INDICATION: 33-year-old male with a history of percutaneous gastrostomy
placement. The gastric tube has become displaced, within the
superficial soft tissues on CT performed 09/09/2019

[Series 1: fl (-) angio · 1 of 1 slices shown]
[im 1/1]
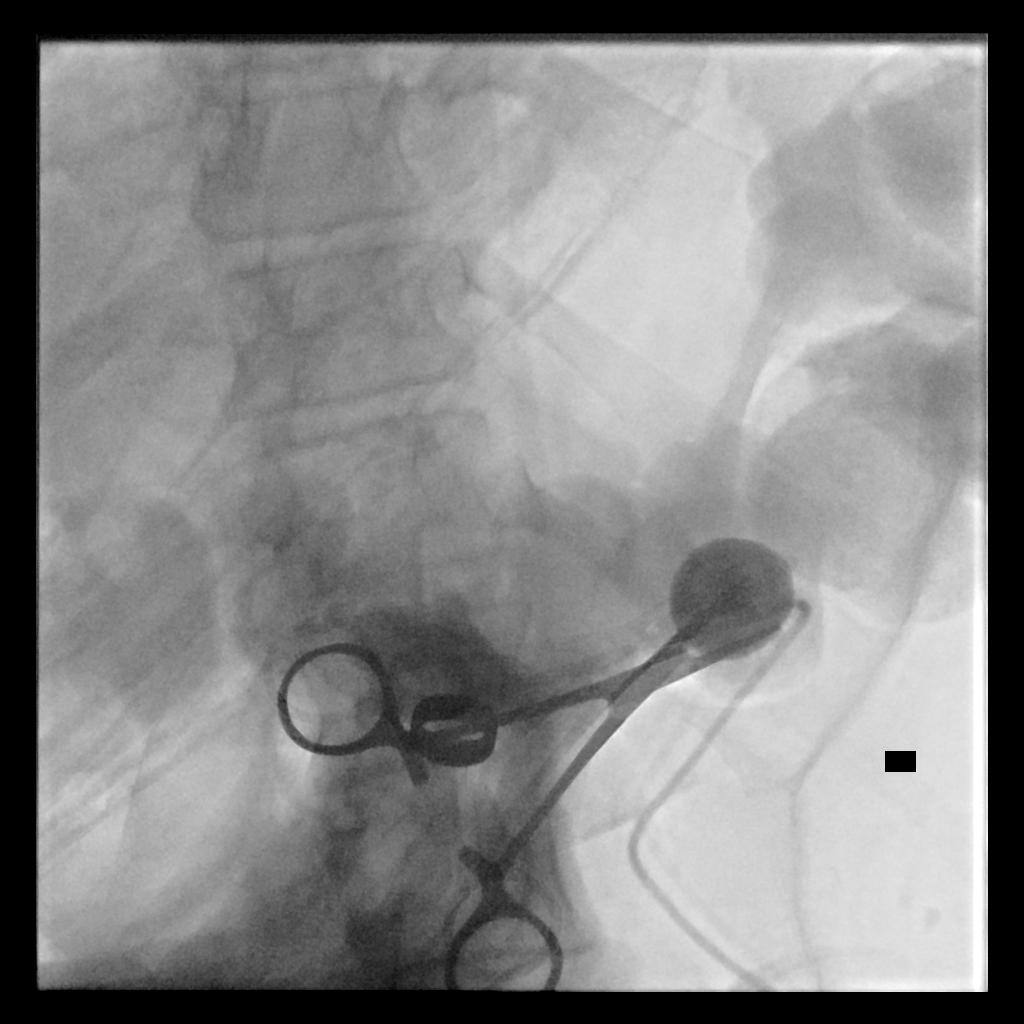

[Series 2: single · 1 of 1 slices shown]
[im 1/1]
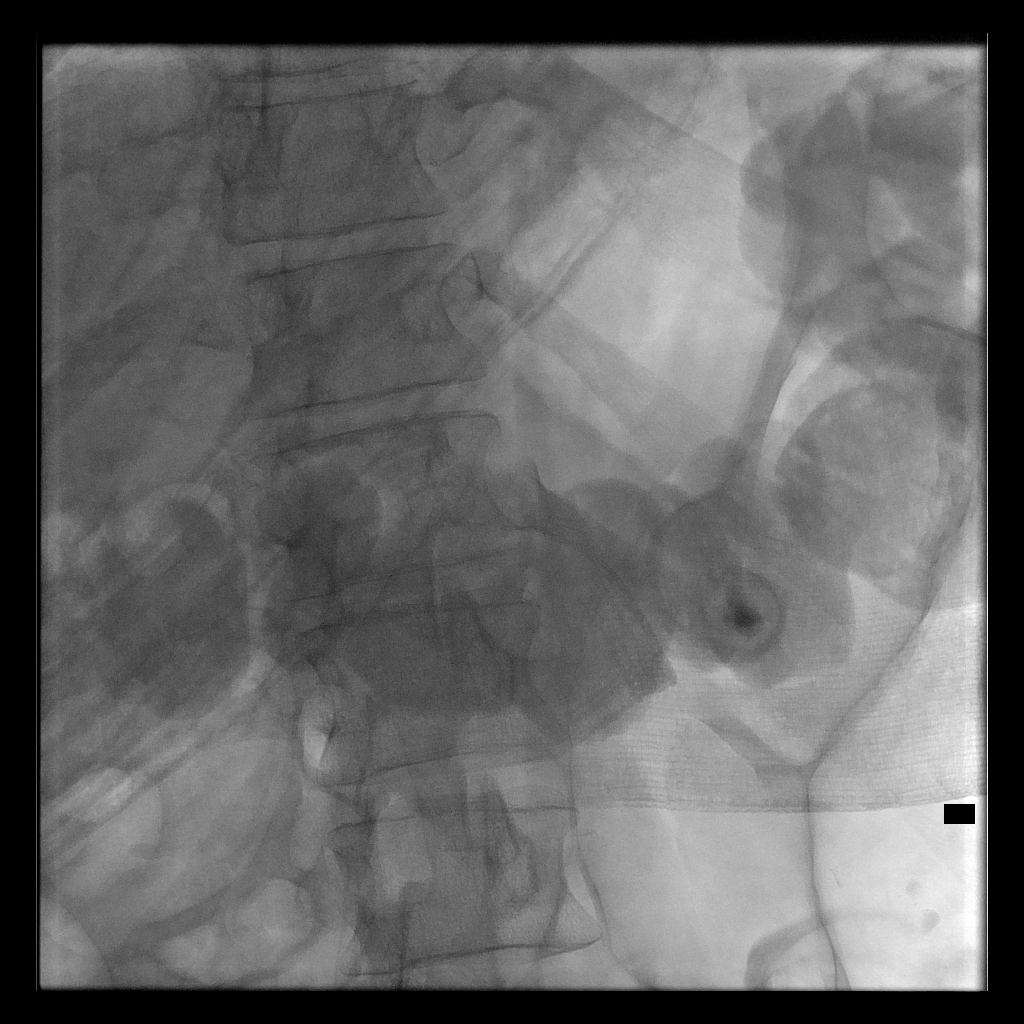

[2 of 2 positions shown; findings below may reference images not displayed]

EXAM:
GASTROSTOMY CATHETER REPLACEMENT

MEDICATIONS:
None

ANESTHESIA/SEDATION:
None

CONTRAST:  5mL OMNIPAQUE IOHEXOL 300 MG/ML SOLN - administered into
the gastric lumen.

FLUOROSCOPY TIME:  Fluoroscopy Time: 0 minutes 54 seconds (16 mGy).

COMPLICATIONS:
None

PROCEDURE:
Informed written consent was obtained from the patient and the
patient's family after a thorough discussion of the procedural
risks, benefits and alternatives. All questions were addressed.
Maximal Sterile Barrier Technique was utilized including caps, mask,
sterile gowns, sterile gloves, sterile drape, hand hygiene and skin
antiseptic. A timeout was performed prior to the initiation of the
procedure.

The epigastrium was prepped with Betadine in a sterile fashion, and
a sterile drape was applied covering the operative field. A sterile
gown and sterile gloves were used for the procedure.

The indwelling gastric tube was gently injected with contrast, with
no contrast visualized to enter the stomach lumen. All the contrast
exited immediately through the skin site.

We then removed the indwelling gastric tube after cutting the stay
sutures.

[REDACTED] tree adapter was then used to attempt to identify a
tract into the stomach, gently injecting contrast.

Ultimately no tract was identified through the abdominal wall
musculature into the stomach.

Patient tolerated the procedure well and remained hemodynamically
stable throughout.

No complications were encountered and no significant blood loss
encountered.
IMPRESSION: Unsuccessful attempt at rescue of a percutaneous gastrostomy that
has been displaced into the abdominal wall. No tract to the stomach
was identified. The nonfunctional gastric tube was removed.

## 2021-09-06 MED ORDER — SODIUM CHLORIDE 0.9 % IV SOLN: INTRAVENOUS | Status: DC

## 2021-09-06 MED ORDER — AZITHROMYCIN 500 MG PO TABS
500.0000 mg | ORAL_TABLET | Freq: Every day | ORAL | Status: DC
  Administered 2021-09-06 – 2021-09-10 (×5): 500 mg via ORAL
  Filled 2021-09-06 (×5): qty 1

## 2021-09-06 NOTE — Progress Notes (Addendum)
Orthopaedic Trauma Progress Note  SUBJECTIVE: Patient resting comfortably in bedside chair. Mother at bedside, states he slept and ate well. Having issues voiding.  OBJECTIVE:  Vitals:   09/06/21 0438 09/06/21 0752  BP: 128/65 128/79  Pulse: 95   Resp:    Temp: 98.4 F (36.9 C) 98.4 F (36.9 C)  SpO2: 95%     General: Resting comfortably, NAD Respiratory: No increased work of breathing.  LLE: CAM boot in place. Dressings removed. Incisions CDI. New Aquacel and mepilex placed over incisions. Difficult to obtain a reliable motor or sensory exam. Skin warm and dry. Compartments soft and compressible.   IMAGING: Stable post op imaging.   LABS:  Results for orders placed or performed during the hospital encounter of 09/03/21 (from the past 24 hour(s))  Basic metabolic panel     Status: Abnormal   Collection Time: 09/06/21  4:23 AM  Result Value Ref Range   Sodium 139 135 - 145 mmol/L   Potassium 3.9 3.5 - 5.1 mmol/L   Chloride 105 98 - 111 mmol/L   CO2 28 22 - 32 mmol/L   Glucose, Bld 101 (H) 70 - 99 mg/dL   BUN 25 (H) 6 - 20 mg/dL   Creatinine, Ser 6.97 0.61 - 1.24 mg/dL   Calcium 8.5 (L) 8.9 - 10.3 mg/dL   GFR, Estimated >94 >80 mL/min   Anion gap 6 5 - 15  CBC     Status: Abnormal   Collection Time: 09/06/21  4:23 AM  Result Value Ref Range   WBC 7.7 4.0 - 10.5 K/uL   RBC 3.61 (L) 4.22 - 5.81 MIL/uL   Hemoglobin 11.0 (L) 13.0 - 17.0 g/dL   HCT 16.5 (L) 53.7 - 48.2 %   MCV 90.0 80.0 - 100.0 fL   MCH 30.5 26.0 - 34.0 pg   MCHC 33.8 30.0 - 36.0 g/dL   RDW 70.7 86.7 - 54.4 %   Platelets 124 (L) 150 - 400 K/uL   nRBC 0.0 0.0 - 0.2 %  Procalcitonin - Baseline     Status: None   Collection Time: 09/06/21  4:23 AM  Result Value Ref Range   Procalcitonin 1.99 ng/mL    ASSESSMENT: Julian Gray is a 36 y.o. male, 2 Days Post-Op s/p INTRAMEDULLARY NAIL LEFT TIBIA  CV/Blood loss: Hgb 14.4 on 09/04/21. Hgb 11 today. Hemodynamically stable  PLAN: Weightbearing: WBAT  LLE ROM: Ok to come out of boot at rest for ankle ROM as tolerated Incisional and dressing care: Reinforce PRN. Removed dressings today. New Aquacel and mepilex dressing placed over incisions.   Showering: Ok to begin getting incisions wet in the shower on 09/07/21.   Orthopedic device(s): CAM boot LLE when ambulating Pain management:  1. Tylenol 650 mg q 6 hours PRN 2. Robaxin 500 mg q 6 hours PRN 3. Norco 5-325 mg q 4 hours PRN moderate pain 4. Norco 7.5-325 mg q 4 hours PRN severe pain 5. Morphine 1-2 mg q 2 hours PRN breakthrough pain VTE prophylaxis: Aspirin, SCDs ID:  Ceftriaxone + Azithromycin for CAP Foley/Lines:  No foley, KVO IVFs Impediments to Fracture Healing: Vit D level 28, started on D3 supplementation Dispo: PT/OT recommending home health. Dressings removed. New aquacel and mepilex placed over incisions for comfort.  Okay for discharge from ortho standpoint once cleared by medicine team and therapies Follow - up plan: 2 weeks after d/c for repeat x-rays and wound check  Contact information:  After hours and holidays please check [email or web address]  for group call information for [facility]   Noemi Chapel, [place] 09/06/2021

## 2021-09-06 NOTE — Progress Notes (Signed)
PHARMACIST - PHYSICIAN COMMUNICATION DR:   Elby Beck CONCERNING: Antibiotic IV to Oral Route Change Policy  RECOMMENDATION: This patient is receiving azithromycin by the intravenous route.  Based on criteria approved by the Pharmacy and Therapeutics Committee, the antibiotic(s) is/are being converted to the equivalent oral dose form(s).   DESCRIPTION: These criteria include: Patient being treated for a respiratory tract infection, urinary tract infection, cellulitis or clostridium difficile associated diarrhea if on metronidazole The patient is not neutropenic and does not exhibit a GI malabsorption state The patient is eating (either orally or via tube) and/or has been taking other orally administered medications for a least 24 hours The patient is improving clinically and has a Tmax < 100.5  If you have questions about this conversion, please contact the Pharmacy Department  []   [phone] )  [phone] []   [phone] )  [facility] [x]   [phone] )  [facility] []   [phone] )  [facility] []   [phone] )  [facility]

## 2021-09-06 NOTE — Progress Notes (Signed)
Physical Therapy Treatment Patient Details Name: Julian Gray MRN: [id number] DOB: June 06, 1986 Today's Date: 09/06/2021   History of Present Illness Pt is a 36 y.o. male admitted 09/03/21 after twisting his leg in parking lot sustaining L tibial shaft and fibula fx. S/p L tibial IMN 2/2. 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    PT Comments    Pt demos good tolerance to initiation of therex, actively contracts muscles to assist PT.  Dad declines transfers at this time as he wants pt to remain up in chair, but does report that he was able to transfer pt by himself yesterday.  Education provided on w/c transfers up steps in order to get into home and dad reports that ideally he would like pt to be able to get to 2nd level where handicapped bathroom is.  PT to reach out to CM to inquire about whether medical transport could be possible in this situation to get pt to handicapped friendly level of home.   Recommendations for follow up therapy are one component of a multi-disciplinary discharge planning process, led by the attending physician.  Recommendations may be updated based on patient status, additional functional criteria and insurance authorization.  Follow Up Recommendations  Home health PT     Assistance Recommended at Discharge Frequent or constant Supervision/Assistance  Patient can return home with the following A lot of help with walking and/or transfers;A lot of help with bathing/dressing/bathroom;Assistance with cooking/housework;Assist for transportation;Help with stairs or ramp for entrance;Direct supervision/assist for medications management   Equipment Recommendations  Rolling walker (2 wheels);BSC/3in1;Wheelchair (measurements PT) (Dad reports having w/c available.)    Recommendations for Other Services       Precautions / Restrictions Precautions Precautions: Fall Required  Braces or Orthoses: Other Brace Other Brace: L CAM boot Restrictions Weight Bearing Restrictions: Yes LLE Weight Bearing: Weight bearing as tolerated     Mobility  Bed Mobility                    Transfers                        Ambulation/Gait                   Stairs Stairs:  (Discussed w/c transfers with dad; dad states they have a w/c and he is familiar with using w/c to go up stairs.  He is receptive to PT providing him with educational sheet for reminders on how to do it safely.)           Wheelchair Mobility    Modified Rankin (Stroke Patients Only)       Balance                                            Cognition Arousal/Alertness: Awake/alert Behavior During Therapy: WFL for tasks assessed/performed Overall Cognitive Status: History of cognitive impairments - at baseline                                 General Comments: Pt. is sitting up in chair when PT arrives.  Dad is present in room.  Dad states he wants pt to remain in chair for as long as possible, does not want  PT to transfer him BTB.  Agreeable to therex.  States he was able to transfer pt. BTB yesterday by himself doing SPT.        Exercises General Exercises - Lower Extremity Ankle Circles/Pumps: Left, 20 reps, AAROM Heel Slides: Left, 20 reps, AAROM Hip ABduction/ADduction: AAROM, Left, 20 reps Straight Leg Raises: AAROM, Left, 20 reps    General Comments        Pertinent Vitals/Pain Pain Assessment Pain Assessment: Faces Faces Pain Scale: Hurts a little bit Pain Location: L LE Pain Descriptors / Indicators: Grimacing Pain Intervention(s): Monitored during session    Home Living                          Prior Function            PT Goals (current goals can now be found in the care plan section) Progress towards PT goals: Progressing toward goals    Frequency    Min 5X/week      PT Plan Current  plan remains appropriate    Co-evaluation              AM-PAC PT "6 Clicks" Mobility   Outcome Measure  Help needed turning from your back to your side while in a flat bed without using bedrails?: A Little Help needed moving from lying on your back to sitting on the side of a flat bed without using bedrails?: A Lot Help needed moving to and from a bed to a chair (including a wheelchair)?: A Lot Help needed standing up from a chair using your arms (e.g., wheelchair or bedside chair)?: A Lot Help needed to walk in hospital room?: A Lot Help needed climbing 3-5 steps with a railing? : A Lot 6 Click Score: 13    End of Session   Activity Tolerance: Patient tolerated treatment well Patient left: in chair;with family/visitor present   Pain - Right/Left: Left Pain - part of body: Leg     Time: 7494-4967 PT Time Calculation (min) (ACUTE ONLY): 17 min  Charges:  $Therapeutic Exercise: 8-22 mins                     Tynslee Bowlds A. Kaydyn Sayas, PT, DPT Acute Rehabilitation Services Office: [phone]    Jeanae Whitmill A Monice Lundy 09/06/2021, 4:09 PM

## 2021-09-06 NOTE — Progress Notes (Signed)
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  In the ED x-ray of the left knee was negative for acute fracture.  Left tibia/fibula x-ray showed distal tibia/fibular shaft fractures, mildly displaced.  Orthopedic surgery was consulted.  Patient admitted for further management.    Patient seen and examined at bedside.  Appeared comfortable, resting in chair.  Nurses had trouble with bladder scan overnight.  Today, patient was able to void.  Discussed extensively with mother and sister at bedside about overall care.    Assessment/Plan: Principal Problem:   Closed fracture of left lower extremity, initial encounter Active Problems:   Intellectual disability   Left distal tibial shaft fracture, mildly displaced S/p intramedullary nailing of left tibia fracture on 09/04/2021 Further management including pain management, DVT PPx as per orthopedics PT/OT  Sepsis likely 2/2 CAP Noted to be tachycardic, tachypneic, febrile with leukocytosis noted post op Father reported some mild cough PTA to the hospital Currently afebrile, with resolved leukocytosis UA/UC, never collected BC x2 NGTD CXR showed multifocal bilateral airspace disease, favoring pneumonia CTA chest showed diffuse groundglass opacities throughout both lungs, likely possible atypical pneumonia, hypersensitivity pneumonitis or respiratory bronchiolitis.  No PE seen Continue ceftriaxone, azithromycin Monitor closely on telemetry  Acute hypoxic respiratory failure Likely 2/2 above Vs post op, r/o ?PE Required as high as 6L of O2 post op, not on any home O2, currently weaned off O2 Management as above, duo nebs,  incentive spirometry if able CT chest as above Supplemental O2 as needed  Persistent tachycardia Could be related to post op pain post, anxiety Vs sepsis Elevated d-dimer (recent surgery) Continue gentle IV fluids CTA chest as above Telemetry  Normocytic anemia/acute blood loss anemia Likely from postop in addition to some hemodilution as patient has been on IV fluids Daily CBC  Intellectual disability/2 seizure disorder Continue clozapine, oxcarbazepine, hydroxyzine as needed  Obesity Lifestyle modification advised     Estimated body mass index is 30.34 kg/m as calculated from the following:   Height as of this encounter: 6\' 1"  (1.854 m).   Weight as of this encounter: 104.3 kg.     Code Status: Full  Family Communication: Discussed extensively with mother/legal guardian at bedside  Disposition Plan: Status is: Inpatient Remains inpatient appropriate because: Level of care    Consultants: Orthopedics  Procedures: S/p intramedullary nailing of left tibia fracture on 09/04/2021  Antimicrobials: Ceftriaxone Azithromycin  DVT prophylaxis: ASA twice daily, SCD   Objective: Vitals:   09/06/21 0438 09/06/21 0752 09/06/21 1157 09/06/21 1448  BP: 128/65 128/79  (!) 142/87  Pulse: 95   100  Resp:      Temp: 98.4 F (36.9 C) 98.4 F (36.9 C)  98.5 F (36.9 C)  TempSrc: Oral Oral  Oral  SpO2: 95%  97%   Weight:      Height:        Intake/Output Summary (Last 24 hours) at 09/06/2021 1724 Last data filed at 09/06/2021 1627 Gross per 24 hour  Intake 2154.52 ml  Output 850 ml  Net 1304.52 ml   Filed Weights   09/04/21 0929  Weight: 104.3 kg    Exam: General: NAD, nonverbal, appears comfortable Cardiovascular: S1, S2 present Respiratory: CTAB Abdomen: Soft, nontender,  nondistended, bowel sounds present Musculoskeletal: No bilateral pedal edema noted Skin: Normal Psychiatry: Unable to assess    Data Reviewed: CBC: Recent Labs  Lab 09/03/21 1836  09/04/21 1936 09/06/21 0423  WBC 9.5 12.0* 7.7  NEUTROABS 7.1 9.5*  --   HGB 13.5 14.4 11.0*  HCT 39.8 42.1 32.5*  MCV 90.7 87.5 90.0  PLT 124* 189 124*   Basic Metabolic Panel: Recent Labs  Lab 09/03/21 1836 09/04/21 1936 09/06/21 0423  NA 138 136 139  K 3.9 4.0 3.9  CL 108 101 105  CO2 22 24 28   GLUCOSE 97 163* 101*  BUN 18 16 25*  CREATININE 0.75 1.13 0.91  CALCIUM 8.1* 9.1 8.5*   GFR: Estimated Creatinine Clearance: 146.5 mL/min (by C-G formula based on SCr of 0.91 mg/dL). Liver Function Tests: Recent Labs  Lab 09/03/21 1836  AST 18  ALT 22  ALKPHOS 87  BILITOT 0.5  PROT 6.7  ALBUMIN 3.8   No results for input(s): LIPASE, AMYLASE in the last 168 hours. No results for input(s): AMMONIA in the last 168 hours. Coagulation Profile: No results for input(s): INR, PROTIME in the last 168 hours. Cardiac Enzymes: No results for input(s): CKTOTAL, CKMB, CKMBINDEX, TROPONINI in the last 168 hours. BNP (last 3 results) No results for input(s): PROBNP in the last 8760 hours. HbA1C: No results for input(s): HGBA1C in the last 72 hours. CBG: No results for input(s): GLUCAP in the last 168 hours. Lipid Profile: No results for input(s): CHOL, HDL, LDLCALC, TRIG, CHOLHDL, LDLDIRECT in the last 72 hours. Thyroid Function Tests: No results for input(s): TSH, T4TOTAL, FREET4, T3FREE, THYROIDAB in the last 72 hours. Anemia Panel: No results for input(s): VITAMINB12, FOLATE, FERRITIN, TIBC, IRON, RETICCTPCT in the last 72 hours. Urine analysis:    Component Value Date/Time   COLORURINE YELLOW 09/01/2019 2054   APPEARANCEUR CLOUDY (A) 09/01/2019 2054   LABSPEC 1.024 09/01/2019 2054   PHURINE 8.0 09/01/2019 2054   GLUCOSEU NEGATIVE 09/01/2019 2054   HGBUR NEGATIVE 09/01/2019 2054   BILIRUBINUR NEGATIVE 09/01/2019 2054   KETONESUR NEGATIVE 09/01/2019 2054   PROTEINUR NEGATIVE 09/01/2019 2054   NITRITE NEGATIVE 09/01/2019 2054   LEUKOCYTESUR NEGATIVE 09/01/2019 2054    Sepsis Labs: @LABRCNTIP (procalcitonin:4,lacticidven:4)  ) Recent Results (from the past 240 hour(s))  Resp Panel by RT-PCR (Flu A&B, Covid) Nasopharyngeal Swab     Status: None   Collection Time: 09/03/21  6:36 PM   Specimen: Nasopharyngeal Swab; Nasopharyngeal(NP) swabs in vial transport medium  Result Value Ref Range Status   SARS Coronavirus 2 by RT PCR NEGATIVE NEGATIVE Final    Comment: (NOTE) SARS-CoV-2 target nucleic acids are NOT DETECTED.  The SARS-CoV-2 RNA is generally detectable in upper respiratory specimens during the acute phase of infection. The lowest concentration of SARS-CoV-2 viral copies this assay can detect is 138 copies/mL. A negative result does not preclude SARS-Cov-2 infection and should not be used as the sole basis for treatment or other patient management decisions. A negative result may occur with  improper specimen collection/handling, submission of specimen other than nasopharyngeal swab, presence of viral mutation(s) within the areas targeted by this assay, and inadequate number of viral copies(<138 copies/mL). A negative result must be combined with clinical observations, patient history, and epidemiological information. The expected result is Negative.  Fact Sheet for Patients:  BloggerCourse.comhttps://www.fda.gov/media/152166/download  Fact Sheet for Healthcare Providers:  SeriousBroker.ithttps://www.fda.gov/media/152162/download  This test is no t yet approved or cleared by the [place] FDA and  has been authorized  for detection and/or diagnosis of SARS-CoV-2 by FDA under an Emergency Use Authorization (EUA). This EUA will remain  in effect (meaning this test can be used) for the duration of the COVID-19 declaration under Section 564(b)(1) of the Act, 21 U.S.C.section 360bbb-3(b)(1), unless the authorization is terminated  or revoked sooner.       Influenza A by PCR NEGATIVE NEGATIVE Final   Influenza B by PCR NEGATIVE NEGATIVE Final    Comment: (NOTE) The  Xpert Xpress SARS-CoV-2/FLU/RSV plus assay is intended as an aid in the diagnosis of influenza from Nasopharyngeal swab specimens and should not be used as a sole basis for treatment. Nasal washings and aspirates are unacceptable for Xpert Xpress SARS-CoV-2/FLU/RSV testing.  Fact Sheet for Patients: EntrepreneurPulse.com.au  Fact Sheet for Healthcare Providers: IncredibleEmployment.be  This test is not yet approved or cleared by the [place] FDA and has been authorized for detection and/or diagnosis of SARS-CoV-2 by FDA under an Emergency Use Authorization (EUA). This EUA will remain in effect (meaning this test can be used) for the duration of the COVID-19 declaration under Section 564(b)(1) of the Act, 21 U.S.C. section 360bbb-3(b)(1), unless the authorization is terminated or revoked.  Performed at [facility], [place] [address] [place], [place] [address]   Culture, blood (routine x 2)     Status: None (Preliminary result)   Collection Time: 09/04/21  7:35 PM   Specimen: BLOOD RIGHT HAND  Result Value Ref Range Status   Specimen Description BLOOD RIGHT HAND  Final   Special Requests   Final    BOTTLES DRAWN AEROBIC AND ANAEROBIC Blood Culture adequate volume   Culture   Final    NO GROWTH 2 DAYS Performed at [facility], [place] [address] [place], [place] [address]    Report Status PENDING  Incomplete  Culture, blood (routine x 2)     Status: None (Preliminary result)   Collection Time: 09/04/21  7:43 PM   Specimen: BLOOD  Result Value Ref Range Status   Specimen Description BLOOD LEFT ANTECUBITAL  Final   Special Requests   Final    BOTTLES DRAWN AEROBIC AND ANAEROBIC Blood Culture adequate volume   Culture   Final    NO GROWTH 2 DAYS Performed at [facility], [place] [address], [place], [place] [address]    Report Status PENDING  Incomplete      Studies: No results found.  Scheduled Meds:   aspirin  325 mg Oral BID   azithromycin  500 mg Oral Daily   celecoxib  200 mg Oral BID   cholecalciferol  1,000 Units Oral Daily   clozapine  50 mg Oral TID   docusate sodium  100 mg Oral BID   oxcarbazepine  600 mg Oral BID   senna-docusate  1 tablet Oral BID    Continuous Infusions:  sodium chloride 75 mL/hr at 09/06/21 1507   cefTRIAXone (ROCEPHIN)  IV 2 g (09/06/21 1043)   methocarbamol (ROBAXIN) IV       LOS: 3 days     Alma Friendly, MD [facility]  If 7PM-7AM, please contact night-coverage [email or web address] 09/06/2021, 5:24 PM

## 2021-09-06 NOTE — Progress Notes (Signed)
Occupational Therapy Treatment Patient Details Name: Julian Gray MRN: [id number] DOB: Apr 18, 1986 Today's Date: 09/06/2021   History of present illness Pt is a 36 y.o. male admitted 09/03/21 after twisting his leg in parking lot sustaining L tibial shaft and fibula fx. S/p L tibial IMN 2/2. 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   OT comments  Patient received in bed and lethargic initially but aroused with help from family. Patient was apprehensive on getting out of bed but was willing with encouragement. Patient required mod assist to get to EOB due to assistance needed with trunk and min assist for sitting balance. Patient performed 2 stands from EOB and appeared unsafe for transfer with RW due to limited standing tolerance. Patient was transferred to recliner with face to face technique and max assist. Patient positioned in recliner with family present. Acute OT to continue to follow.    Recommendations for follow up therapy are one component of a multi-disciplinary discharge planning process, led by the attending physician.  Recommendations may be updated based on patient status, additional functional criteria and insurance authorization.    Follow Up Recommendations  No OT follow up    Assistance Recommended at Discharge Frequent or constant Supervision/Assistance  Patient can return home with the following  Two people to help with walking and/or transfers;Two people to help with bathing/dressing/bathroom;Assistance with cooking/housework;Assist for transportation;Help with stairs or ramp for entrance   Equipment Recommendations       Recommendations for Other Services      Precautions / Restrictions Precautions Precautions: Fall Required Braces or Orthoses: Other Brace Other Brace: L CAM boot Restrictions Weight Bearing Restrictions: Yes LLE Weight Bearing: Weight bearing as tolerated        Mobility Bed Mobility Overal bed mobility: Needs Assistance Bed Mobility: Supine to Sit     Supine to sit: Mod assist, HOB elevated     General bed mobility comments: mod assist to assist for getting to EOB due to patient being resistant to getting up    Transfers Overall transfer level: Needs assistance Equipment used: Rolling walker (2 wheels), None Transfers: Sit to/from Stand, Bed to chair/wheelchair/BSC Sit to Stand: Mod assist, +2 physical assistance, +2 safety/equipment Stand pivot transfers: Max assist         General transfer comment: Patient performed 2 stands from EOB with assistance from nursing with therapist.  Patient appeared unsafe for transfer with RW and performed with face to face technique and max assist.     Balance Overall balance assessment: Needs assistance Sitting-balance support: No upper extremity supported Sitting balance-Leahy Scale: Fair Sitting balance - Comments: posterior leaning while sitting on EOB Postural control: Posterior lean Standing balance support: Bilateral upper extremity supported, During functional activity Standing balance-Leahy Scale: Poor Standing balance comment: reliant on RW for support and limited standing tolerance                           ADL either performed or assessed with clinical judgement   ADL                                              Extremity/Trunk Assessment              Vision       Perception  Praxis      Cognition Arousal/Alertness: Awake/alert Behavior During Therapy: WFL for tasks assessed/performed Overall Cognitive Status: History of cognitive impairments - at baseline                                 General Comments: Patient apprehensive about getting out of bed inially and required verbal cues to encourage        Exercises      Shoulder Instructions       General Comments      Pertinent Vitals/ Pain        Pain Assessment Pain Assessment: Faces Faces Pain Scale: Hurts little more Pain Location: L LE Pain Descriptors / Indicators: Guarding, Discomfort Pain Intervention(s): Limited activity within patient's tolerance, Monitored during session, Repositioned, Ice applied  Home Living                                          Prior Functioning/Environment              Frequency  Min 2X/week        Progress Toward Goals  OT Goals(current goals can now be found in the care plan section)  Progress towards OT goals: Progressing toward goals  Acute Rehab OT Goals OT Goal Formulation: With patient/family Time For Goal Achievement: 09/19/21 Potential to Achieve Goals: Good ADL Goals Pt Will Transfer to Toilet: with mod assist;ambulating;bedside commode  Plan Discharge plan remains appropriate    Co-evaluation                 AM-PAC OT "6 Clicks" Daily Activity     Outcome Measure   Help from another person eating meals?: A Lot Help from another person taking care of personal grooming?: A Lot Help from another person toileting, which includes using toliet, bedpan, or urinal?: Total Help from another person bathing (including washing, rinsing, drying)?: Total Help from another person to put on and taking off regular upper body clothing?: Total Help from another person to put on and taking off regular lower body clothing?: Total 6 Click Score: 8    End of Session Equipment Utilized During Treatment: Gait belt;Rolling walker (2 wheels)  OT Visit Diagnosis: Unsteadiness on feet (R26.81)   Activity Tolerance Patient tolerated treatment well   Patient Left in chair;with call bell/phone within reach;with chair alarm set;with family/visitor present   Nurse Communication Mobility status;Other (comment) (instructed nursing that therapist would return if assistance is needed for transfer back to bed)        Time: 6948-5462 OT Time Calculation (min): 25  min  Charges: OT General Charges $OT Visit: 1 Visit OT Treatments $Therapeutic Activity: 23-37 mins  Alfonse Flavors, OTA Acute Rehabilitation Services  Pager [phone] Office [phone]   Dewain Penning 09/06/2021, 11:57 AM

## 2021-09-06 NOTE — Progress Notes (Signed)
Pt has trouble voiding this morning. Last time he urinated at 2000 yesterday,had of urine output .Bladder scan shows 12mL. Bladder scan was done by 3 RNs; 2 different bladder scanners were used, pt was on fluids 75 mL/hr all night. He refuses to get up to the [facility]; tried to encourage him to urinate; warm packs applied on lower abdomen. [facility] was notified. Per Dr. Talbert Nan bladder scan shows zero urine volume would not do I&O cath."

## 2021-09-06 NOTE — Plan of Care (Signed)
°  Problem: Clinical Measurements: Goal: Ability to maintain clinical measurements within normal limits will improve Outcome: Progressing Goal: Respiratory complications will improve Outcome: Progressing   Problem: Education: Goal: Knowledge of General Education information will improve Description: Including pain rating scale, medication(s)/side effects and non-pharmacologic comfort measures Outcome: Not Applicable

## 2021-09-06 NOTE — Plan of Care (Signed)
  Problem: Activity: Goal: Risk for activity intolerance will decrease Outcome: Progressing   Problem: Pain Managment: Goal: General experience of comfort will improve Outcome: Progressing   Problem: Safety: Goal: Ability to remain free from injury will improve Outcome: Progressing   

## 2021-09-07 DIAGNOSIS — S8292XA Unspecified fracture of left lower leg, initial encounter for closed fracture: Secondary | ICD-10-CM | POA: Diagnosis not present

## 2021-09-07 DIAGNOSIS — J189 Pneumonia, unspecified organism: Secondary | ICD-10-CM | POA: Diagnosis not present

## 2021-09-07 DIAGNOSIS — F79 Unspecified intellectual disabilities: Secondary | ICD-10-CM | POA: Diagnosis not present

## 2021-09-07 DIAGNOSIS — A419 Sepsis, unspecified organism: Secondary | ICD-10-CM | POA: Diagnosis not present

## 2021-09-07 LAB — CBC WITH DIFFERENTIAL/PLATELET
Abs Immature Granulocytes: 0.02 10*3/uL (ref 0.00–0.07)
Basophils Absolute: 0 10*3/uL (ref 0.0–0.1)
Basophils Relative: 0 %
Eosinophils Absolute: 0.5 10*3/uL (ref 0.0–0.5)
Eosinophils Relative: 8 %
HCT: 31.8 % — ABNORMAL LOW (ref 39.0–52.0)
Hemoglobin: 10.7 g/dL — ABNORMAL LOW (ref 13.0–17.0)
Immature Granulocytes: 0 %
Lymphocytes Relative: 20 %
Lymphs Abs: 1.3 10*3/uL (ref 0.7–4.0)
MCH: 29.9 pg (ref 26.0–34.0)
MCHC: 33.6 g/dL (ref 30.0–36.0)
MCV: 88.8 fL (ref 80.0–100.0)
Monocytes Absolute: 0.6 10*3/uL (ref 0.1–1.0)
Monocytes Relative: 10 %
Neutro Abs: 4 10*3/uL (ref 1.7–7.7)
Neutrophils Relative %: 62 %
Platelets: 142 10*3/uL — ABNORMAL LOW (ref 150–400)
RBC: 3.58 MIL/uL — ABNORMAL LOW (ref 4.22–5.81)
RDW: 12.5 % (ref 11.5–15.5)
WBC: 6.4 10*3/uL (ref 4.0–10.5)
nRBC: 0 % (ref 0.0–0.2)

## 2021-09-07 LAB — BASIC METABOLIC PANEL
Anion gap: 6 (ref 5–15)
BUN: 23 mg/dL — ABNORMAL HIGH (ref 6–20)
CO2: 27 mmol/L (ref 22–32)
Calcium: 8.3 mg/dL — ABNORMAL LOW (ref 8.9–10.3)
Chloride: 106 mmol/L (ref 98–111)
Creatinine, Ser: 0.83 mg/dL (ref 0.61–1.24)
GFR, Estimated: >60 mL/min (ref >60)
Glucose, Bld: 104 mg/dL — ABNORMAL HIGH (ref 70–99)
Potassium: 3.7 mmol/L (ref 3.5–5.1)
Sodium: 139 mmol/L (ref 135–145)

## 2021-09-07 LAB — PROCALCITONIN: Procalcitonin: 0.93 ng/mL

## 2021-09-07 IMAGING — RF DG ESOPHAGUS
1 series · 14 of 24 positions shown · IV contrast (omnipaque)
Comparison: Portable chest 09/11/2019. Chest CT 08/24/2019 and
esophagram 08/28/2019.

CLINICAL DATA: Esophageal perforation due to food impaction treated
with placement of a partially covered esophageal stent on
08/25/2019. Esophageal stent removal today.

EXAM:
ESOPHOGRAM/BARIUM SWALLOW
TECHNIQUE: Single contrast examination was performed using 90 cc of Omnipaque
300 diluted with saline. Study was performed in the operating room
under the direction of Dr. Nomasibulele.
FLUOROSCOPY TIME:  Fluoroscopy Time:  1 minutes and 52 seconds
Radiation Exposure Index (if provided by the fluoroscopic device):
NA
Number of Acquired Spot Images: 0

[Series 1: run · 8 acquisitions, 14 frames shown]
[im 1/8]
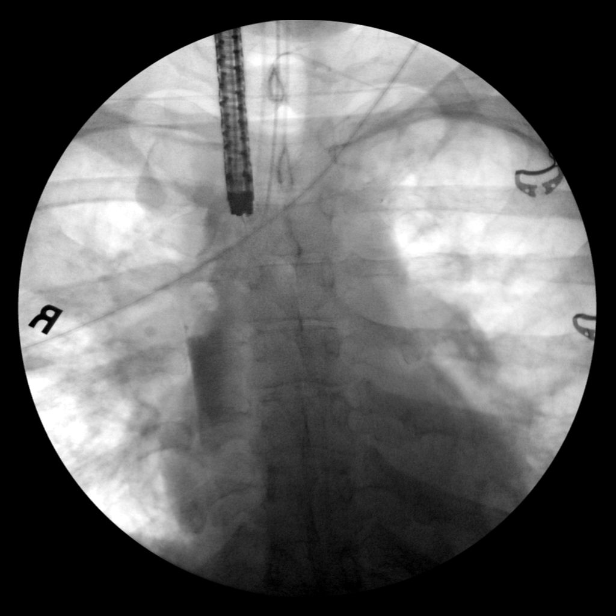
[im 1/8]
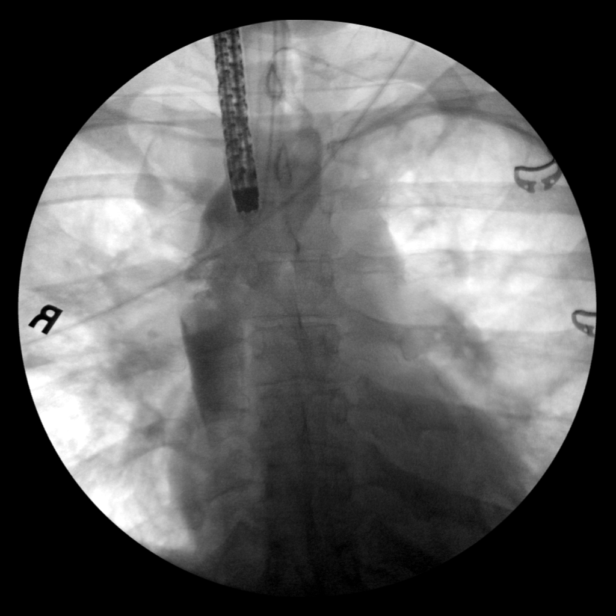
[im 2/8]
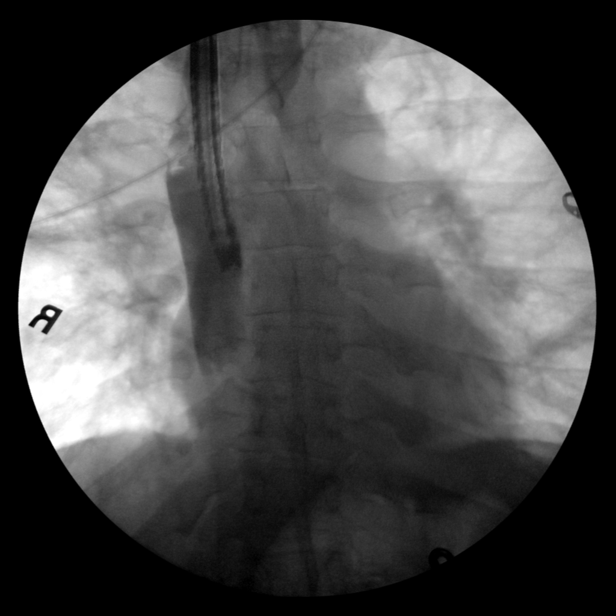
[im 3/8]
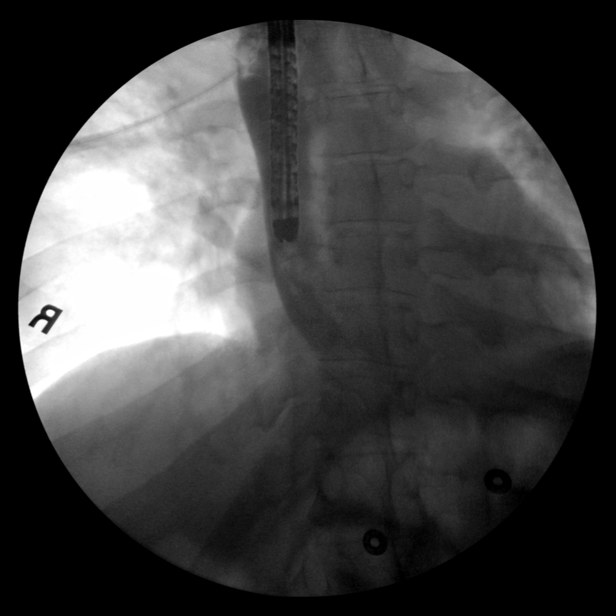
[im 3/8]
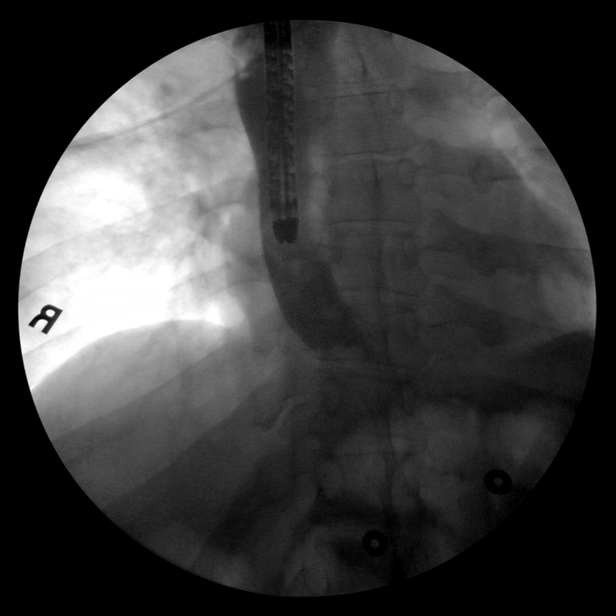
[im 4/8]
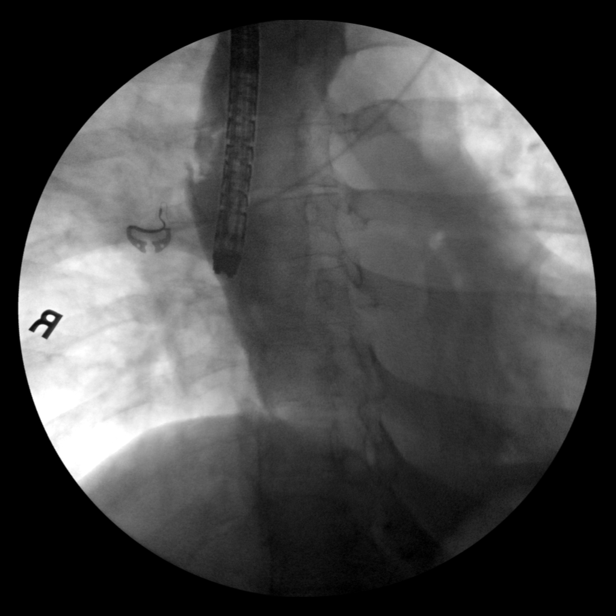
[im 4/8]
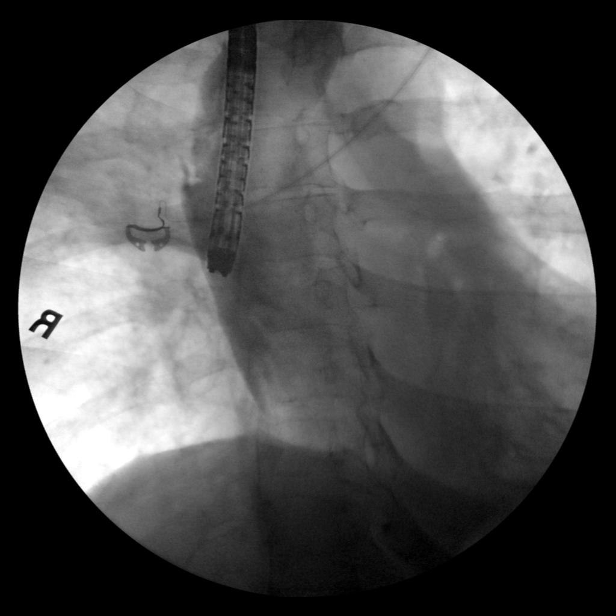
[im 5/8]
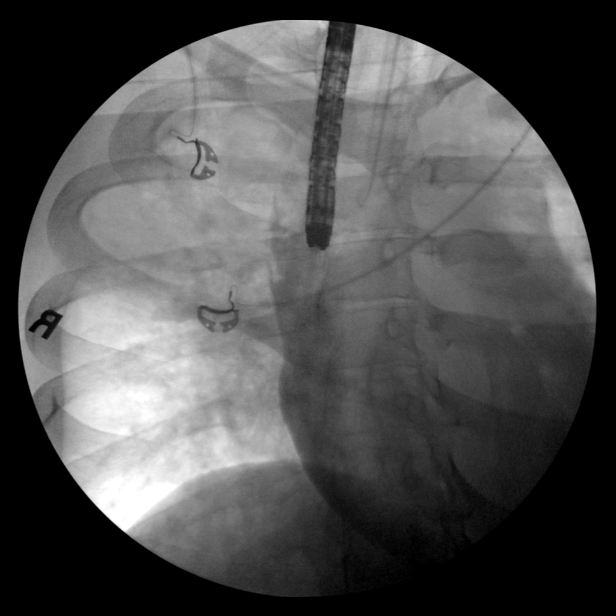
[im 5/8]
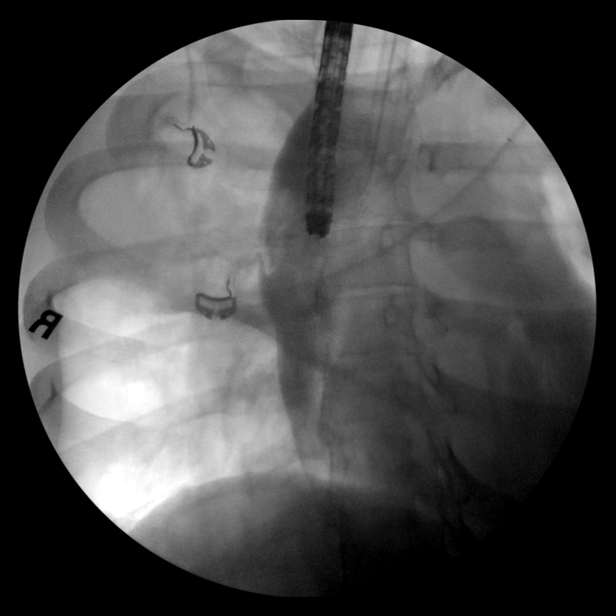
[im 6/8]
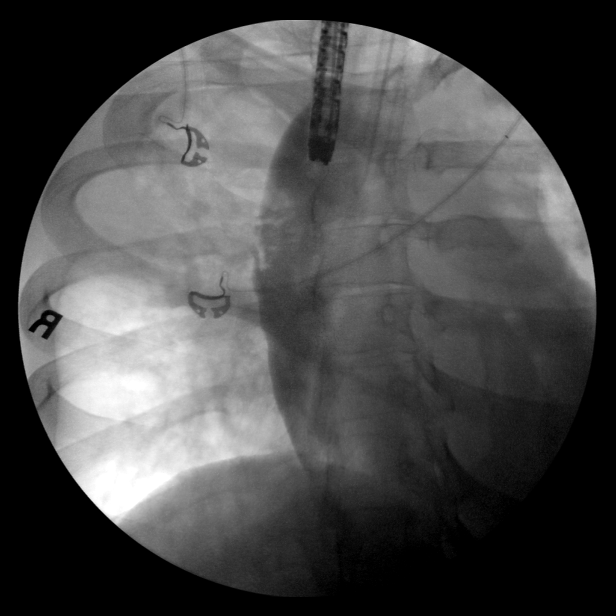
[im 6/8]
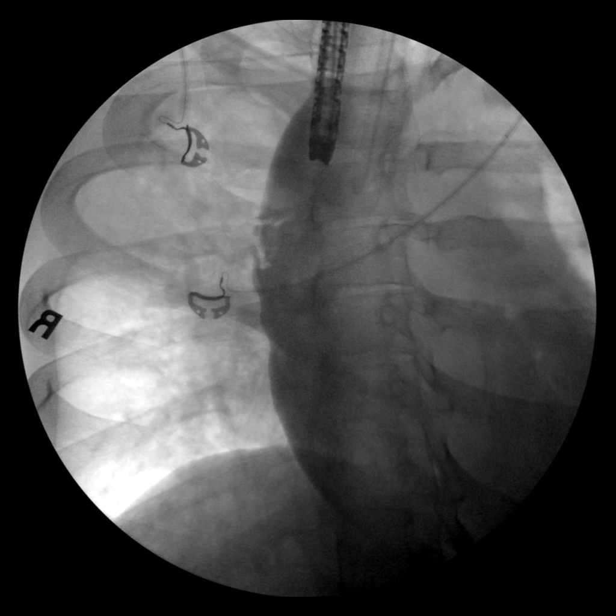
[im 7/8]
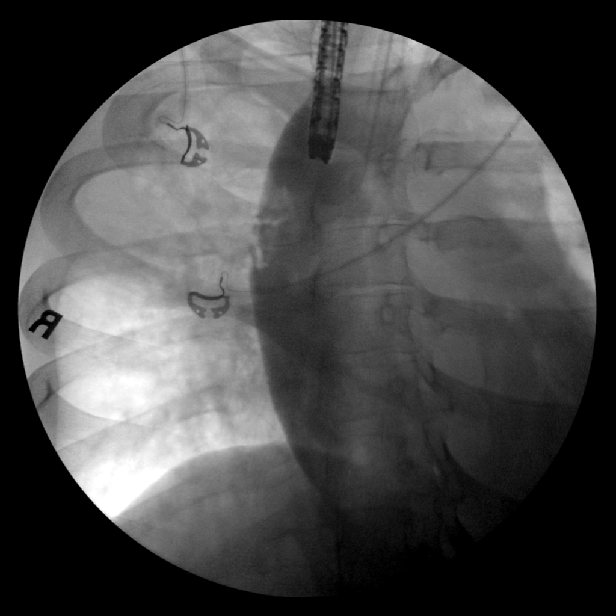
[im 8/8]
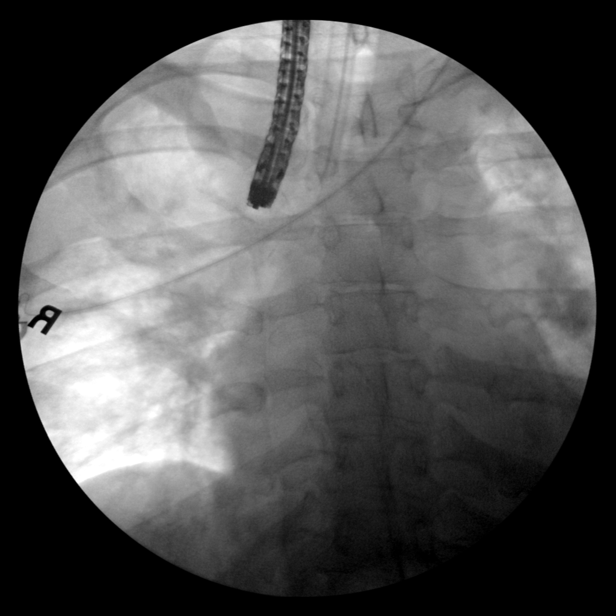
[im 8/8]
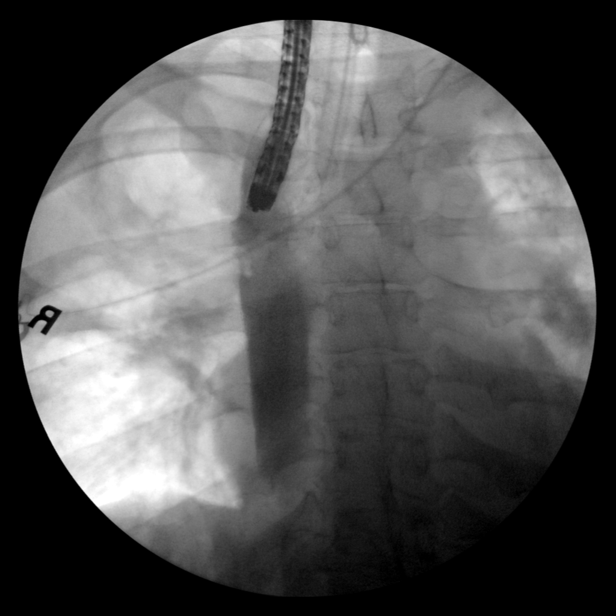

[14 of 24 positions shown; findings below may reference images not displayed]

FINDINGS: Study was performed after removal of the esophageal stent. Endoscope
and endotracheal tube are in place. There is a filling defect in the
midesophagus involving the right lateral wall. This persisted
throughout the examination, although no contrast extravasation into
the mediastinum or pleural space was identified. The distal
esophagus is patent. No evidence of aspiration.
IMPRESSION: 1. Indeterminate mucosal irregularity along the right wall of the
midesophagus following esophageal stent removal. Correlate with
endoscopic impression.
2. No evidence of esophageal leak.

## 2021-09-07 NOTE — Plan of Care (Signed)
°  Problem: Clinical Measurements: Goal: Will remain free from infection Outcome: Progressing   Problem: Coping: Goal: Level of anxiety will decrease Outcome: Progressing   Problem: Elimination: Goal: Will not experience complications related to urinary retention Outcome: Progressing   Problem: Skin Integrity: Goal: Risk for impaired skin integrity will decrease Outcome: Progressing

## 2021-09-07 NOTE — Progress Notes (Signed)
PROGRESS NOTE  Julian Gray [id number] DOB: 07-21-1986 DOA: 09/03/2021 PCP: Haywood Pao, MD  HPI/Recap of past 14 hours: 36 year old male with medical history of premature birth complicated by anoxic brain injury, intellectual disability disorder, autism spectrum disorder, history of seizure-like activity came to ED after fall with left leg injury.  In the ED x-ray of the left knee was negative for acute fracture.  Left tibia/fibula x-ray showed distal tibia/fibular shaft fractures, mildly displaced.  Orthopedic surgery was consulted.  Patient admitted for further management.    Today, patient continues to be stable, appears comfortable.  Discussed extensively with father at bedside about overall care and discharge plans.    Assessment/Plan: Principal Problem:   Closed fracture of left lower extremity, initial encounter Active Problems:   Intellectual disability   Left distal tibial shaft fracture, mildly displaced S/p intramedullary nailing of left tibia fracture on 09/04/2021 Further management including pain management, DVT PPx as per orthopedics PT/OT  Sepsis likely 2/2 CAP Noted to be tachycardic, tachypneic, febrile with leukocytosis noted post op Father reported some mild cough PTA to the hospital Currently afebrile, with resolved leukocytosis UA/UC, never collected BC x2 NGTD CXR showed multifocal bilateral airspace disease, favoring pneumonia CTA chest showed diffuse groundglass opacities throughout both lungs, likely possible atypical pneumonia, hypersensitivity pneumonitis or respiratory bronchiolitis.  No PE seen Continue ceftriaxone, azithromycin Monitor closely on telemetry  Acute hypoxic respiratory failure Likely 2/2 above Required as high as 6L of O2 post op, not on any home O2, currently weaned off O2 Management as above, duo nebs, incentive spirometry if able CT chest as above Supplemental O2 as needed  Persistent tachycardia Improving Could  be related to post op pain post, anxiety Vs sepsis Elevated d-dimer (recent surgery) CTA chest as above Telemetry  Normocytic anemia/acute blood loss anemia Likely from postop in addition to some hemodilution as patient has been on IV fluids Daily CBC  Intellectual disability/2 seizure disorder Continue clozapine, oxcarbazepine, hydroxyzine as needed  Obesity Lifestyle modification advised     Estimated body mass index is 30.34 kg/m as calculated from the following:   Height as of this encounter: 6\' 1"  (1.854 m).   Weight as of this encounter: 104.3 kg.     Code Status: Full  Family Communication: Discussed extensively with father/legal guardian at bedside  Disposition Plan: Status is: Inpatient Remains inpatient appropriate because: Level of care    Consultants: Orthopedics  Procedures: S/p intramedullary nailing of left tibia fracture on 09/04/2021  Antimicrobials: Ceftriaxone Azithromycin  DVT prophylaxis: ASA twice daily, SCD   Objective: Vitals:   09/06/21 2122 09/07/21 0606 09/07/21 0736 09/07/21 1357  BP: 140/83 130/77 (!) 159/86 (!) 160/94  Pulse: 88 89 85 96  Resp:  14 15 (!) 22  Temp: 98.3 F (36.8 C) 97.9 F (36.6 C) 98.4 F (36.9 C) 98.3 F (36.8 C)  TempSrc: Oral Axillary Axillary Axillary  SpO2: 96% 95% 94% 97%  Weight:      Height:        Intake/Output Summary (Last 24 hours) at 09/07/2021 1623 Last data filed at 09/07/2021 1000 Gross per 24 hour  Intake 1480.39 ml  Output 875 ml  Net 605.39 ml   Filed Weights   09/04/21 0929  Weight: 104.3 kg    Exam: General: NAD, nonverbal, appears comfortable Cardiovascular: S1, S2 present Respiratory: CTAB Abdomen: Soft, nontender, nondistended, bowel sounds present Musculoskeletal: No bilateral pedal edema noted Skin: Normal Psychiatry: Unable to assess    Data Reviewed:  CBC: Recent Labs  Lab 09/03/21 1836 09/04/21 1936 09/06/21 0423 09/07/21 0726  WBC 9.5 12.0* 7.7 6.4   NEUTROABS 7.1 9.5*  --  4.0  HGB 13.5 14.4 11.0* 10.7*  HCT 39.8 42.1 32.5* 31.8*  MCV 90.7 87.5 90.0 88.8  PLT 124* 189 124* [id number]*   Basic Metabolic Panel: Recent Labs  Lab 09/03/21 1836 09/04/21 1936 09/06/21 0423 09/07/21 0726  NA 138 136 139 139  K 3.9 4.0 3.9 3.7  CL 108 101 105 106  CO2 22 24 28 27   GLUCOSE 97 163* 101* 104*  BUN 18 16 25* 23*  CREATININE 0.75 1.13 0.91 0.83  CALCIUM 8.1* 9.1 8.5* 8.3*   GFR: Estimated Creatinine Clearance: 160.6 mL/min (by C-G formula based on SCr of 0.83 mg/dL). Liver Function Tests: Recent Labs  Lab 09/03/21 1836  AST 18  ALT 22  ALKPHOS 87  BILITOT 0.5  PROT 6.7  ALBUMIN 3.8   No results for input(s): LIPASE, AMYLASE in the last 168 hours. No results for input(s): AMMONIA in the last 168 hours. Coagulation Profile: No results for input(s): INR, PROTIME in the last 168 hours. Cardiac Enzymes: No results for input(s): CKTOTAL, CKMB, CKMBINDEX, TROPONINI in the last 168 hours. BNP (last 3 results) No results for input(s): PROBNP in the last 8760 hours. HbA1C: No results for input(s): HGBA1C in the last 72 hours. CBG: No results for input(s): GLUCAP in the last 168 hours. Lipid Profile: No results for input(s): CHOL, HDL, LDLCALC, TRIG, CHOLHDL, LDLDIRECT in the last 72 hours. Thyroid Function Tests: No results for input(s): TSH, T4TOTAL, FREET4, T3FREE, THYROIDAB in the last 72 hours. Anemia Panel: No results for input(s): VITAMINB12, FOLATE, FERRITIN, TIBC, IRON, RETICCTPCT in the last 72 hours. Urine analysis:    Component Value Date/Time   COLORURINE YELLOW 09/01/2019 2054   APPEARANCEUR CLOUDY (A) 09/01/2019 2054   LABSPEC 1.024 09/01/2019 2054   PHURINE 8.0 09/01/2019 2054   GLUCOSEU NEGATIVE 09/01/2019 2054   HGBUR NEGATIVE 09/01/2019 2054   BILIRUBINUR NEGATIVE 09/01/2019 2054   [place] NEGATIVE 09/01/2019 2054   PROTEINUR NEGATIVE 09/01/2019 2054   NITRITE NEGATIVE 09/01/2019 2054   LEUKOCYTESUR  NEGATIVE 09/01/2019 2054   Sepsis Labs: @LABRCNTIP (procalcitonin:4,lacticidven:4)  ) Recent Results (from the past 240 hour(s))  Resp Panel by RT-PCR (Flu A&B, Covid) Nasopharyngeal Swab     Status: None   Collection Time: 09/03/21  6:36 PM   Specimen: Nasopharyngeal Swab; Nasopharyngeal(NP) swabs in vial transport medium  Result Value Ref Range Status   SARS Coronavirus 2 by RT PCR NEGATIVE NEGATIVE Final    Comment: (NOTE) SARS-CoV-2 target nucleic acids are NOT DETECTED.  The SARS-CoV-2 RNA is generally detectable in upper respiratory specimens during the acute phase of infection. The lowest concentration of SARS-CoV-2 viral copies this assay can detect is 138 copies/mL. A negative result does not preclude SARS-Cov-2 infection and should not be used as the sole basis for treatment or other patient management decisions. A negative result may occur with  improper specimen collection/handling, submission of specimen other than nasopharyngeal swab, presence of viral mutation(s) within the areas targeted by this assay, and inadequate number of viral copies(<138 copies/mL). A negative result must be combined with clinical observations, patient history, and epidemiological information. The expected result is Negative.  Fact Sheet for Patients:  EntrepreneurPulse.com.au  Fact Sheet for Healthcare Providers:  IncredibleEmployment.be  This test is no t yet approved or cleared by the [place] FDA and  has been authorized for detection and/or  diagnosis of SARS-CoV-2 by FDA under an Emergency Use Authorization (EUA). This EUA will remain  in effect (meaning this test can be used) for the duration of the COVID-19 declaration under Section 564(b)(1) of the Act, 21 U.S.C.section 360bbb-3(b)(1), unless the authorization is terminated  or revoked sooner.       Influenza A by PCR NEGATIVE NEGATIVE Final   Influenza B by PCR NEGATIVE NEGATIVE Final     Comment: (NOTE) The Xpert Xpress SARS-CoV-2/FLU/RSV plus assay is intended as an aid in the diagnosis of influenza from Nasopharyngeal swab specimens and should not be used as a sole basis for treatment. Nasal washings and aspirates are unacceptable for Xpert Xpress SARS-CoV-2/FLU/RSV testing.  Fact Sheet for Patients: EntrepreneurPulse.com.au  Fact Sheet for Healthcare Providers: IncredibleEmployment.be  This test is not yet approved or cleared by the [place] FDA and has been authorized for detection and/or diagnosis of SARS-CoV-2 by FDA under an Emergency Use Authorization (EUA). This EUA will remain in effect (meaning this test can be used) for the duration of the COVID-19 declaration under Section 564(b)(1) of the Act, 21 U.S.C. section 360bbb-3(b)(1), unless the authorization is terminated or revoked.  Performed at [facility], [place] [address] [place], [place] [address]   Culture, blood (routine x 2)     Status: None (Preliminary result)   Collection Time: 09/04/21  7:35 PM   Specimen: BLOOD RIGHT HAND  Result Value Ref Range Status   Specimen Description BLOOD RIGHT HAND  Final   Special Requests   Final    BOTTLES DRAWN AEROBIC AND ANAEROBIC Blood Culture adequate volume   Culture   Final    NO GROWTH 3 DAYS Performed at [facility], [place] [address], [place], [place] [address]    Report Status PENDING  Incomplete  Culture, blood (routine x 2)     Status: None (Preliminary result)   Collection Time: 09/04/21  7:43 PM   Specimen: BLOOD  Result Value Ref Range Status   Specimen Description BLOOD LEFT ANTECUBITAL  Final   Special Requests   Final    BOTTLES DRAWN AEROBIC AND ANAEROBIC Blood Culture adequate volume   Culture   Final    NO GROWTH 3 DAYS Performed at [facility], [place] [address] [place], [place] [address]    Report Status PENDING  Incomplete      Studies: No results  found.  Scheduled Meds:  aspirin  325 mg Oral BID   azithromycin  500 mg Oral Daily   celecoxib  200 mg Oral BID   cholecalciferol  1,000 Units Oral Daily   clozapine  50 mg Oral TID   docusate sodium  100 mg Oral BID   oxcarbazepine  600 mg Oral BID   senna-docusate  1 tablet Oral BID    Continuous Infusions:  cefTRIAXone (ROCEPHIN)  IV 2 g (09/07/21 1032)   methocarbamol (ROBAXIN) IV       LOS: 4 days     Alma Friendly, MD [facility]  If 7PM-7AM, please contact night-coverage [email or web address] 09/07/2021, 4:23 PM

## 2021-09-07 NOTE — Progress Notes (Signed)
Subjective: 3 Days Post-Op s/p Procedure(s): INTRAMEDULLARY (IM) NAIL TIBIAL  Resting comfortable in bed. Dad at bedside states he voided yesterday evening.   Objective:  PE: VITALS:   Vitals:   09/06/21 1448 09/06/21 2122 09/07/21 0606 09/07/21 0736  BP: (!) 142/87 140/83 130/77 (!) 159/86  Pulse: 100 88 89 85  Resp:   14 15  Temp: 98.5 F (36.9 C) 98.3 F (36.8 C) 97.9 F (36.6 C) 98.4 F (36.9 C)  TempSrc: Oral Oral Axillary Axillary  SpO2:  96% 95% 94%  Weight:      Height:        General: Resting comfortably, NAD Respiratory: No increased work of breathing.  GI: soft, no grimacing on palpation LLE: Incisions CDI. New Aquacel and mepilex placed over incisions due to poor seal of yesterday's dressings. Not able to obtain motor or sensory exam. Skin warm and dry. 2+ DP pulse. Compartments soft and compressible.  LABS  Results for orders placed or performed during the hospital encounter of 09/03/21 (from the past 24 hour(s))  Basic metabolic panel     Status: Abnormal   Collection Time: 09/07/21  7:26 AM  Result Value Ref Range   Sodium 139 135 - 145 mmol/L   Potassium 3.7 3.5 - 5.1 mmol/L   Chloride 106 98 - 111 mmol/L   CO2 27 22 - 32 mmol/L   Glucose, Bld 104 (H) 70 - 99 mg/dL   BUN 23 (H) 6 - 20 mg/dL   Creatinine, Ser 3.53 0.61 - 1.24 mg/dL   Calcium 8.3 (L) 8.9 - 10.3 mg/dL   GFR, Estimated >61 >44 mL/min   Anion gap 6 5 - 15  CBC with Differential/Platelet     Status: Abnormal   Collection Time: 09/07/21  7:26 AM  Result Value Ref Range   WBC 6.4 4.0 - 10.5 K/uL   RBC 3.58 (L) 4.22 - 5.81 MIL/uL   Hemoglobin 10.7 (L) 13.0 - 17.0 g/dL   HCT 31.5 (L) 40.0 - 86.7 %   MCV 88.8 80.0 - 100.0 fL   MCH 29.9 26.0 - 34.0 pg   MCHC 33.6 30.0 - 36.0 g/dL   RDW 61.9 50.9 - 32.6 %   Platelets 142 (L) 150 - 400 K/uL   nRBC 0.0 0.0 - 0.2 %   Neutrophils Relative % 62 %   Neutro Abs 4.0 1.7 - 7.7 K/uL   Lymphocytes Relative 20 %   Lymphs Abs 1.3 0.7 - 4.0  K/uL   Monocytes Relative 10 %   Monocytes Absolute 0.6 0.1 - 1.0 K/uL   Eosinophils Relative 8 %   Eosinophils Absolute 0.5 0.0 - 0.5 K/uL   Basophils Relative 0 %   Basophils Absolute 0.0 0.0 - 0.1 K/uL   Immature Granulocytes 0 %   Abs Immature Granulocytes 0.02 0.00 - 0.07 K/uL    CT Angio Chest Pulmonary Embolism (PE) W or WO Contrast  Result Date: 09/05/2021 CLINICAL DATA:  Pulmonary embolism suspected. EXAM: CT ANGIOGRAPHY CHEST WITH CONTRAST TECHNIQUE: Multidetector CT imaging of the chest was performed using the standard protocol during bolus administration of intravenous contrast. Multiplanar CT image reconstructions and MIPs were obtained to evaluate the vascular anatomy. RADIATION DOSE REDUCTION: This exam was performed according to the departmental dose-optimization program which includes automated exposure control, adjustment of the mA and/or kV according to patient size and/or use of iterative reconstruction technique. CONTRAST:  OMNIPAQUE IOHEXOL 350 MG/ML SOLN COMPARISON:  Chest CT dated 08/24/2019 FINDINGS:  Cardiovascular: There is no pulmonary embolism identified within the main, lobar or central segmental pulmonary arteries bilaterally. Some of the most peripheral segmental and subsegmental pulmonary arteries are difficult to definitively characterize due to patient breathing motion artifact. No thoracic aortic aneurysm or evidence of aortic dissection. No pericardial effusion. Mediastinum/Nodes: No mass or enlarged lymph nodes are seen within the mediastinum or perihilar regions. Esophagus appears normal on today's exam. Trachea and central bronchi are unremarkable. Lungs/Pleura: Diffuse ground-glass opacities throughout both lungs, slightly more confluent/nodular within the LEFT lower lobe. No pleural effusion or pneumothorax is seen. Upper Abdomen: Limited images of the upper abdomen are unremarkable. Musculoskeletal: Osseous structures are unremarkable. Review of the MIP  images confirms the above findings. IMPRESSION: 1. Diffuse ground-glass opacities throughout both lungs, slightly more confluent/nodular within the LEFT lower lobe. Differential includes atypical pneumonias such as viral or fungal, hypersensitivity pneumonitis, and respiratory bronchiolitis. Differential would include inhalational exposure and COVID pneumonia. 2. No pulmonary embolism seen, with mild study limitations detailed above. Electronically Signed   By: Bary Richard M.D.   On: 09/05/2021 16:46    ASSESSMENT: Julian Gray is a 36 y.o. male, 3 Days Post-Op s/p INTRAMEDULLARY NAIL LEFT TIBIA   CV/Blood loss: Hgb 10.7 today. Hemodynamically stable   PLAN: Weightbearing: WBAT LLE ROM: Ok to come out of boot at rest for ankle ROM as tolerated Incisional and dressing care: Reinforce PRN.  Showering: Ok to begin getting incisions wet in the shower on 09/07/21.   Orthopedic device(s): CAM boot LLE when ambulating Pain management:  1. Tylenol 650 mg q 6 hours PRN 2. Robaxin 500 mg q 6 hours PRN 3. Norco 5-325 mg q 4 hours PRN moderate pain 4. Norco 7.5-325 mg q 4 hours PRN severe pain 5. Morphine 1-2 mg q 2 hours PRN breakthrough pain VTE prophylaxis: Aspirin, SCDs ID:  Ceftriaxone + Azithromycin for CAP Foley/Lines:  No foley, KVO IVFs Impediments to Fracture Healing: Vit D level 28, started on D3 supplementation Dispo: PT/OT recommending home health. 3n1 ordered, Dad states they have walker and wheelchair at home. TOC on board for home health. Okay for discharge from ortho standpoint once cleared by medicine team and therapies Follow - up plan: 2 weeks after d/c for repeat x-rays and wound check   Contact information:  After hours and holidays please check [email or web address] for group call information for [facility]  Armida Sans 09/07/2021, 8:12 AM

## 2021-09-08 DIAGNOSIS — F79 Unspecified intellectual disabilities: Secondary | ICD-10-CM | POA: Diagnosis not present

## 2021-09-08 DIAGNOSIS — R000 Tachycardia, unspecified: Secondary | ICD-10-CM | POA: Diagnosis not present

## 2021-09-08 DIAGNOSIS — J189 Pneumonia, unspecified organism: Secondary | ICD-10-CM | POA: Diagnosis not present

## 2021-09-08 DIAGNOSIS — S8292XA Unspecified fracture of left lower leg, initial encounter for closed fracture: Secondary | ICD-10-CM | POA: Diagnosis not present

## 2021-09-08 LAB — CBC WITH DIFFERENTIAL/PLATELET
Abs Immature Granulocytes: 0.04 10*3/uL (ref 0.00–0.07)
Basophils Absolute: 0 10*3/uL (ref 0.0–0.1)
Basophils Relative: 0 %
Eosinophils Absolute: 0.6 10*3/uL — ABNORMAL HIGH (ref 0.0–0.5)
Eosinophils Relative: 8 %
HCT: 32.7 % — ABNORMAL LOW (ref 39.0–52.0)
Hemoglobin: 10.8 g/dL — ABNORMAL LOW (ref 13.0–17.0)
Immature Granulocytes: 1 %
Lymphocytes Relative: 16 %
Lymphs Abs: 1.2 10*3/uL (ref 0.7–4.0)
MCH: 29.3 pg (ref 26.0–34.0)
MCHC: 33 g/dL (ref 30.0–36.0)
MCV: 88.6 fL (ref 80.0–100.0)
Monocytes Absolute: 0.7 10*3/uL (ref 0.1–1.0)
Monocytes Relative: 9 %
Neutro Abs: 4.8 10*3/uL (ref 1.7–7.7)
Neutrophils Relative %: 66 %
Platelets: 173 10*3/uL (ref 150–400)
RBC: 3.69 MIL/uL — ABNORMAL LOW (ref 4.22–5.81)
RDW: 12.5 % (ref 11.5–15.5)
WBC: 7.3 10*3/uL (ref 4.0–10.5)
nRBC: 0 % (ref 0.0–0.2)

## 2021-09-08 LAB — PROCALCITONIN: Procalcitonin: 0.6 ng/mL

## 2021-09-08 MED ORDER — ASPIRIN EC 81 MG PO TBEC: 81.0000 mg | DELAYED_RELEASE_TABLET | Freq: Two times a day (BID) | ORAL | 0 refills | Status: AC

## 2021-09-08 MED ORDER — HYDROCODONE-ACETAMINOPHEN 5-325 MG PO TABS: 1.0000 | ORAL_TABLET | ORAL | 0 refills | Status: DC | PRN

## 2021-09-08 MED ORDER — METOPROLOL TARTRATE 12.5 MG HALF TABLET
12.5000 mg | ORAL_TABLET | Freq: Two times a day (BID) | ORAL | Status: DC
  Administered 2021-09-08 – 2021-09-10 (×5): 12.5 mg via ORAL
  Filled 2021-09-08 (×5): qty 1

## 2021-09-08 NOTE — Progress Notes (Signed)
Physical Therapy Treatment Patient Details Name: Julian Gray MRN: [id number] DOB: 1986/01/28 Today's Date: 09/08/2021   History of Present Illness Pt is a 36 y.o. male admitted 09/03/21 after twisting his leg in parking lot sustaining L tibial shaft and fibula fx. S/p L tibial IMN 2/2. 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    PT Comments    Pt received in supine, mother present in room, pt agreeable to therapy session and with fair participation and tolerance for transfer training. Pt with emotional lability with transfer training and not agreeable to placement of gait belt for balance/safety or to use RW for balance. Pt able to perform stand pivot transfer to bedside chair with up to +2 max assist. Pt not receptive to instruction on LE ROM/exercises due to agitation/anxiety after pivoting. Increased time needed for encouragement, instruction for technique and due to pt anxiety. Co-treatment with OT due to pt cognitive deficit, decreased activity tolerance due to pain and poor safety awareness. Pt continues to benefit from PT services to progress toward functional mobility goals. Pt will likely need PTAR transport into home due to needing to go up a flight of stairs for access to accessible home setup on second floor at his house, SW notified pt mother may need info on available resources for obtaining chair lift for stairs for future access/egress from home in case of emergency.  Recommendations for follow up therapy are one component of a multi-disciplinary discharge planning process, led by the attending physician.  Recommendations may be updated based on patient status, additional functional criteria and insurance authorization.  Follow Up Recommendations  Home health PT     Assistance Recommended at Discharge Frequent or constant Supervision/Assistance  Patient can return home with the following A  lot of help with walking and/or transfers;A lot of help with bathing/dressing/bathroom;Assistance with cooking/housework;Assist for transportation;Help with stairs or ramp for entrance;Direct supervision/assist for medications management   Equipment Recommendations  Rolling walker (2 wheels);BSC/3in1;Wheelchair (measurements PT) (needs at least 20 or 21" wide WC, the one they have at home was from grandmother and is too small for him. He may also benefit from chair stair lift for access to 2nd floor as it is handicap accessible for him. SW notified to check for resources if able.)    Recommendations for Other Services       Precautions / Restrictions Precautions Precautions: Fall Required Braces or Orthoses: Other Brace Other Brace: L CAM boot Restrictions Weight Bearing Restrictions: Yes LLE Weight Bearing: Weight bearing as tolerated Other Position/Activity Restrictions: in cam boot     Mobility  Bed Mobility Overal bed mobility: Needs Assistance Bed Mobility: Supine to Sit     Supine to sit: Min guard     General bed mobility comments: only needing +1 assist to get to EOB with use of bed features, OT assisting him with this portion    Transfers Overall transfer level: Needs assistance Equipment used: 2 person hand held assist Transfers: Sit to/from Stand, Bed to chair/wheelchair/BSC Sit to Stand: Max assist, +2 physical assistance, From elevated surface   Step pivot transfers: Max assist, +2 physical assistance       General transfer comment: Pt mother also present and providing min guard at his hands per pt request, pt tearful throughout in anticipation and experience of pain. One therapist on either side of body providing maxA with transfer pad and under arms.    Ambulation/Gait  Pre-gait activities: pivotal steps to chair only, pt tearful and refusing further transfer attempts.        Balance Overall balance assessment: Needs assistance Sitting-balance  support: No upper extremity supported Sitting balance-Leahy Scale: Fair     Standing balance support: Bilateral upper extremity supported, During functional activity Standing balance-Leahy Scale: Poor Standing balance comment: reliant on external support and limited standing tolerance        Cognition Arousal/Alertness: Awake/alert Behavior During Therapy: Agitated, Restless, Impulsive, Anxious, Flat affect Overall Cognitive Status: History of cognitive impairments - at baseline         General Comments: pt cooperative with encouragement, but anxious regarding need for staff assist to pivot to chair and combative when discussing need for gait belt for safety. Ultimately deferred gait belt as mother also unable to convince him to allow for gait belt to be placed. Pt impulsively ripped off the surgical dressing from his L knee when he was encouraged to place gait belt, so RN notified.        Exercises Other Exercises Other Exercises: defer, pt too anxious to follow instructions after pivoting    General Comments General comments (skin integrity, edema, etc.): Pt HR to 128 bpm while seated, pt anxious/tearful      Pertinent Vitals/Pain Pain Assessment Pain Assessment: Faces Faces Pain Scale: Hurts even more Breathing: occasional labored breathing, short period of hyperventilation Negative Vocalization: occasional moan/groan, low speech, negative/disapproving quality Facial Expression: facial grimacing Body Language: rigid, fists clenched, knees up, pushing/pulling away, strikes out Consolability: distracted or reassured by voice/touch PAINAD Score: 7 Pain Location: L LE (knee) with standing and pivoting Pain Descriptors / Indicators: Grimacing, Crying, Operative site guarding Pain Intervention(s): Limited activity within patient's tolerance, Monitored during session, Premedicated before session, Repositioned, Ice applied     PT Goals (current goals can now be found in the  care plan section) Acute Rehab PT Goals Patient Stated Goal: return home with family assist PT Goal Formulation: With patient/family Time For Goal Achievement: 09/19/21 Progress towards PT goals: Progressing toward goals    Frequency    Min 5X/week      PT Plan Current plan remains appropriate    Co-evaluation PT/OT/SLP Co-Evaluation/Treatment: Yes Reason for Co-Treatment: Necessary to address cognition/behavior during functional activity;For patient/therapist safety;To address functional/ADL transfers PT goals addressed during session: Mobility/safety with mobility;Balance;Proper use of DME        AM-PAC PT "6 Clicks" Mobility   Outcome Measure  Help needed turning from your back to your side while in a flat bed without using bedrails?: A Little Help needed moving from lying on your back to sitting on the side of a flat bed without using bedrails?: A Little Help needed moving to and from a bed to a chair (including a wheelchair)?: Total (at times is +1 assist, today +2 assist) Help needed standing up from a chair using your arms (e.g., wheelchair or bedside chair)?: A Lot Help needed to walk in hospital room?: Total Help needed climbing 3-5 steps with a railing? : Total 6 Click Score: 11    End of Session Equipment Utilized During Treatment: Gait belt Activity Tolerance: Patient limited by pain;Treatment limited secondary to agitation Patient left: in chair;with call bell/phone within reach;with chair alarm set;with family/visitor present Nurse Communication: Mobility status;Other (comment) (needs new LLE surgical dressing) PT Visit Diagnosis: Other abnormalities of gait and mobility (R26.89);Pain Pain - Right/Left: Left Pain - part of body: Leg     Time: 1030-1055 PT Time Calculation (min) (  ACUTE ONLY): 25 min  Charges:  $Therapeutic Activity: 8-22 mins                     Suzanna Zahn P., PTA Acute Rehabilitation Services Pager: [phone] Office: [phone]     Angus Palms 09/08/2021, 2:01 PM

## 2021-09-08 NOTE — TOC Initial Note (Signed)
Transition of Care [facility]) - Initial/Assessment Note    Patient Details  Name: Julian Gray MRN: [id number] Date of Birth: 1986-03-05  Transition of Care [facility] CM/SW Contact:    Bess Kinds, RN Phone Number: [phone] 09/08/2021, 11:25 AM  Clinical Narrative:                  Spoke with mom at the bedside to discuss post acute transition. PTA home with both parents. Has 2nd floor bedroom with handicap bathroom.   He is normally ambulatory, but mom expressed concerns about getting up stairs. Discussed using [facility] for transportation home - agreeable.   DME needs include 3N1 and wheelchair. Referral to [facility] for delivery to the room.   Agreeable to [facility]. Offered choice of agencies. Referral accepted by [facility] for PT and SW. Patient will need HH PT, SW order with Face to Face.   TOC following for transition needs.   Expected Discharge Plan: Home w Home Health Services     Patient Goals and CMS Choice Patient states their goals for this hospitalization and ongoing recovery are:: return home with parents CMS Medicare.gov Compare Post Acute Care list provided to:: Legal Guardian (Mom) Choice offered to / list presented to : Parent  Expected Discharge Plan and Services Expected Discharge Plan: Home w Home Health Services   Discharge Planning Services: CM Consult Post Acute Care Choice: Durable Medical Equipment, Home Health Living arrangements for the past 2 months: Single Family Home                 DME Arranged: 3-N-1, Wheelchair manual DME Agency: [facility] Date DME Agency Contacted: 09/08/21 Time DME Agency Contacted: 1124 Representative spoke with at DME Agency: Velna Hatchet HH Arranged: PT, Social Work [employer] Agency: [facility] Date HH Agency Contacted: 09/08/21 Time HH Agency Contacted: 1124 Representative spoke with at [facility] Agency: Barbara Cower  Prior Living Arrangements/Services Living arrangements for the past 2 months: Single Family Home Lives  with:: Parents, Self          Need for Family Participation in Patient Care: Yes (Comment) Care giver support system in place?: Yes (comment)   Criminal Activity/Legal Involvement Pertinent to Current Situation/Hospitalization: No - Comment as needed  Activities of Daily Living Home Assistive Devices/Equipment: None ADL Screening (condition at time of admission) Patient's cognitive ability adequate to safely complete daily activities?: No Is the patient deaf or have difficulty hearing?: No Does the patient have difficulty seeing, even when wearing glasses/contacts?: No Does the patient have difficulty concentrating, remembering, or making decisions?: Yes (limited speech and some understanding) Patient able to express need for assistance with ADLs?: No Does the patient have difficulty dressing or bathing?: Yes Independently performs ADLs?: No Communication: Independent Dressing (OT): Needs assistance Is this a change from baseline?: Pre-admission baseline Grooming: Needs assistance Is this a change from baseline?: Pre-admission baseline Feeding: Needs assistance Is this a change from baseline?: Pre-admission baseline Bathing: Needs assistance Is this a change from baseline?: Pre-admission baseline Toileting: Needs assistance Is this a change from baseline?: Pre-admission baseline In/Out Bed: Needs assistance Is this a change from baseline?: Pre-admission baseline Walks in Home: Needs assistance Is this a change from baseline?: Pre-admission baseline Does the patient have difficulty walking or climbing stairs?: Yes Weakness of Legs: Left Weakness of Arms/Hands: Both (walks leaning forward and falls easily)  Permission Sought/Granted                  Emotional Assessment Appearance:: Appears stated  age       Alcohol / Substance Use: Never Used Psych Involvement: No (comment)  Admission diagnosis:  Surgery, elective [Z41.9] Closed fracture of left lower extremity,  initial encounter [S82.92XA] Patient Active Problem List   Diagnosis Date Noted   Closed fracture of left lower extremity, initial encounter 09/03/2021   Complication of gastrostomy tube (HCC) 09/09/2019   Normocytic anemia 09/09/2019   Seizure disorder (HCC) 09/09/2019   Insomnia 09/09/2019   Pneumomediastinum (HCC) 08/24/2019   Esophageal perforation 08/24/2019   Intellectual disability 02/27/2015   Autism spectrum 02/27/2015   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 AT [address] OF [facility] [address] [place] [address] Phone: [phone] Fax: [phone]     Social Determinants of Health (SDOH) Interventions    Readmission Risk Interventions No flowsheet data found.

## 2021-09-08 NOTE — Progress Notes (Signed)
Orthopaedic Trauma Progress Note  SUBJECTIVE: Doing well today, in a good mood this afternoon.  Had a tough time with therapies earlier today as he did not feel well but was able to get up to bedside chair and is now resting comfortably. Dad is at bedside.  No concerns or complaints currently.  Plan is for discharge home tomorrow.  OBJECTIVE:  Vitals:   09/08/21 1016 09/08/21 1146  BP:  123/76  Pulse:  96  Resp: 16 20  Temp:  100.2 F (37.9 C)  SpO2:  95%    General: Resting comfortably, NAD Respiratory: No increased work of breathing.  LLE: Cam boot removed for evaluation of the wounds.  Significant bruising throughout lower leg and ankle.  Steri-Strips coming off of the incision.  I removed all the Steri-Strips and cleaned the incision with sterile saline.  New Steri-Strips applied.  Distal incisions stable.  All incisions were left open to air.  Patient endorses sensation to light touch distally.  Able to wiggle toes.  Skin warm and dry. Compartments soft and compressible.  2+ DP pulse  IMAGING: Stable post op imaging.   LABS:  Results for orders placed or performed during the hospital encounter of 09/03/21 (from the past 24 hour(s))  Procalcitonin     Status: None   Collection Time: 09/08/21  5:57 AM  Result Value Ref Range   Procalcitonin 0.60 ng/mL  CBC with Differential/Platelet     Status: Abnormal   Collection Time: 09/08/21  5:57 AM  Result Value Ref Range   WBC 7.3 4.0 - 10.5 K/uL   RBC 3.69 (L) 4.22 - 5.81 MIL/uL   Hemoglobin 10.8 (L) 13.0 - 17.0 g/dL   HCT 97.9 (L) 89.2 - 11.9 %   MCV 88.6 80.0 - 100.0 fL   MCH 29.3 26.0 - 34.0 pg   MCHC 33.0 30.0 - 36.0 g/dL   RDW 41.7 40.8 - 14.4 %   Platelets 173 150 - 400 K/uL   nRBC 0.0 0.0 - 0.2 %   Neutrophils Relative % 66 %   Neutro Abs 4.8 1.7 - 7.7 K/uL   Lymphocytes Relative 16 %   Lymphs Abs 1.2 0.7 - 4.0 K/uL   Monocytes Relative 9 %   Monocytes Absolute 0.7 0.1 - 1.0 K/uL   Eosinophils Relative 8 %    Eosinophils Absolute 0.6 (H) 0.0 - 0.5 K/uL   Basophils Relative 0 %   Basophils Absolute 0.0 0.0 - 0.1 K/uL   Immature Granulocytes 1 %   Abs Immature Granulocytes 0.04 0.00 - 0.07 K/uL    ASSESSMENT: Julian Gray is a 36 y.o. male, 4 Days Post-Op s/p INTRAMEDULLARY NAIL LEFT TIBIA  CV/Blood loss: Hemoglobin 10.8 this morning, stable.   PLAN: Weightbearing: WBAT LLE ROM: Ok to come out of boot at rest for ankle ROM as tolerated Incisional and dressing care: Reinforce PRN.  Okay to leave incisions open to air if no drainage Showering: Ok to begin getting incisions wet in the shower Orthopedic device(s): CAM boot LLE when ambulating Pain management:  1. Tylenol 650 mg q 6 hours PRN 2. Robaxin 500 mg q 6 hours PRN 3. Norco 5-325 mg q 4 hours PRN moderate pain 4. Norco 7.5-325 mg q 4 hours PRN severe pain 5. Morphine 1-2 mg q 2 hours PRN breakthrough pain VTE prophylaxis: Aspirin, SCDs ID:  Ceftriaxone + Azithromycin for CAP Foley/Lines:  No foley, KVO IVFs Impediments to Fracture Healing: Vit D level 28, started on D3 supplementation  Dispo: PT/OT as tolerated, current recommendations include home health therapies.  Okay for discharge from ortho standpoint once cleared by medicine team and therapies.  Discharge Rx for pain control and aspirin have been sent to patient's pharmacy Follow - up plan: 2 weeks after d/c for repeat x-rays and wound check  Contact information:  Truitt Merle MD, Thyra Breed PA-C. After hours and holidays please check [email or web address] for group call information for [facility]   Thompson Caul, PA-C [phone] (office) [email or web address]

## 2021-09-08 NOTE — Progress Notes (Signed)
°  °  Durable Medical Equipment  (From admission, onward)           Start     Ordered   09/08/21 1113  For home use only DME lightweight manual wheelchair with seat cushion  Once       Comments: Patient suffers from closed fracture L leg which impairs their ability to perform daily activities like bathing, dressing, and toileting in the home.  A walker will not resolve  issue with performing activities of daily living. A wheelchair will allow patient to safely perform daily activities. Patient is not able to propel themselves in the home using a standard weight wheelchair due to general weakness. Patient can self propel in the lightweight wheelchair. Length of need 6 months . Accessories: elevating leg rests (ELRs), wheel locks, extensions and anti-tippers.   09/08/21 1113   09/07/21 0821  For home use only DME 3 n 1  Once        09/07/21 0820

## 2021-09-08 NOTE — Progress Notes (Signed)
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  In the ED x-ray of the left knee was negative for acute fracture.  Left tibia/fibula x-ray showed distal tibia/fibular shaft fractures, mildly displaced.  Orthopedic surgery was consulted.  Patient admitted for further management.    Today, patient appeared comfortable, mother at bedside.  Patient with spike in low-grade temp.  Patient's room appeared to be very hot with a broken AC.      Assessment/Plan: Principal Problem:   Closed fracture of left lower extremity, initial encounter Active Problems:   Intellectual disability   Left distal tibial shaft fracture, mildly displaced S/p intramedullary nailing of left tibia fracture on 09/04/2021 Further management including pain management, DVT PPx as per orthopedics PT/OT- HH  Sepsis likely 2/2 CAP Noted to be tachycardic, tachypneic, febrile with leukocytosis noted post op Father reported some mild cough PTA to the hospital Currently spiked a low-grade fever on 09/08/2021, with resolved leukocytosis UA/UC, never collected [facility] x2 NGTD CXR showed multifocal bilateral airspace disease, favoring pneumonia CTA chest showed diffuse groundglass opacities throughout both lungs, likely possible atypical pneumonia, hypersensitivity pneumonitis or respiratory bronchiolitis.  No PE seen Continue ceftriaxone, azithromycin for total of 7 days Monitor closely on telemetry  Acute hypoxic respiratory failure Resolved Likely 2/2 above Required as high as 6L of O2 post op, not on any home O2, currently weaned off O2 Management as above, duo nebs, incentive spirometry if able CT chest as  above Supplemental O2 as needed  Persistent tachycardia Ongoing Could be related to post op pain post, anxiety Vs sepsis Elevated d-dimer (recent surgery) TSH, free T4 pending CTA chest as above Started on low-dose beta-blocker Telemetry  Elevated BP Ongoing Could be related to postop pain, as patient unable to verbalize Started on low-dose BB  Normocytic anemia/acute blood loss anemia Likely from postop in addition to some hemodilution as patient has been on IV fluids Daily CBC  Intellectual disability/2 seizure disorder Continue clozapine, oxcarbazepine, hydroxyzine as needed  Obesity     Estimated body mass index is 30.34 kg/m as calculated from the following:   Height as of this encounter: 6\' 1"  (1.854 m).   Weight as of this encounter: 104.3 kg.     Code Status: Full  Family Communication: Discussed extensively with mother/legal guardian at bedside  Disposition Plan: Status is: Inpatient Remains inpatient appropriate because: Level of care    Consultants: Orthopedics  Procedures: S/p intramedullary nailing of left tibia fracture on 09/04/2021  Antimicrobials: Ceftriaxone Azithromycin  DVT prophylaxis: ASA twice daily, SCD   Objective: Vitals:   09/08/21 1005 09/08/21 1012 09/08/21 1016 09/08/21 1146  BP:    123/76  Pulse: (!) 114   96  Resp:  16 16 20   Temp:  99.9 F (37.7 C)  100.2 F (37.9 C)  TempSrc:  Axillary  Axillary  SpO2:    95%  Weight:      Height:        Intake/Output Summary (Last 24 hours) at 09/08/2021 1413 Last data filed at 09/08/2021 1147 Gross per 24 hour  Intake 340 ml  Output 1840 ml  Net -1500 ml   Filed Weights   09/04/21 0929  Weight: 104.3 kg    Exam:  General: NAD, nonverbal, appears comfortable Cardiovascular: S1, S2 present Respiratory: CTAB Abdomen: Soft, nontender, nondistended, bowel sounds present Musculoskeletal: No bilateral pedal edema noted Skin: Normal Psychiatry: Unable to  assess    Data Reviewed: CBC: Recent Labs  Lab 09/03/21 1836 09/04/21 1936 09/06/21 0423 09/07/21 0726 09/08/21 0557  WBC 9.5 12.0* 7.7 6.4 7.3  NEUTROABS 7.1 9.5*  --  4.0 4.8  HGB 13.5 14.4 11.0* 10.7* 10.8*  HCT 39.8 42.1 32.5* 31.8* 32.7*  MCV 90.7 87.5 90.0 88.8 88.6  PLT 124* 189 124* 142* [id number]   Basic Metabolic Panel: Recent Labs  Lab 09/03/21 1836 09/04/21 1936 09/06/21 0423 09/07/21 0726  NA 138 136 139 139  K 3.9 4.0 3.9 3.7  CL 108 101 105 106  CO2 22 24 28 27   GLUCOSE 97 163* 101* 104*  BUN 18 16 25* 23*  CREATININE 0.75 1.13 0.91 0.83  CALCIUM 8.1* 9.1 8.5* 8.3*   GFR: Estimated Creatinine Clearance: 160.6 mL/min (by C-G formula based on SCr of 0.83 mg/dL). Liver Function Tests: Recent Labs  Lab 09/03/21 1836  AST 18  ALT 22  ALKPHOS 87  BILITOT 0.5  PROT 6.7  ALBUMIN 3.8   No results for input(s): LIPASE, AMYLASE in the last 168 hours. No results for input(s): AMMONIA in the last 168 hours. Coagulation Profile: No results for input(s): INR, PROTIME in the last 168 hours. Cardiac Enzymes: No results for input(s): CKTOTAL, CKMB, CKMBINDEX, TROPONINI in the last 168 hours. BNP (last 3 results) No results for input(s): PROBNP in the last 8760 hours. HbA1C: No results for input(s): HGBA1C in the last 72 hours. CBG: No results for input(s): GLUCAP in the last 168 hours. Lipid Profile: No results for input(s): CHOL, HDL, LDLCALC, TRIG, CHOLHDL, LDLDIRECT in the last 72 hours. Thyroid Function Tests: No results for input(s): TSH, T4TOTAL, FREET4, T3FREE, THYROIDAB in the last 72 hours. Anemia Panel: No results for input(s): VITAMINB12, FOLATE, FERRITIN, TIBC, IRON, RETICCTPCT in the last 72 hours. Urine analysis:    Component Value Date/Time   COLORURINE YELLOW 09/01/2019 2054   APPEARANCEUR CLOUDY (A) 09/01/2019 2054   LABSPEC 1.024 09/01/2019 2054   PHURINE 8.0 09/01/2019 2054   GLUCOSEU NEGATIVE 09/01/2019 2054   HGBUR NEGATIVE  09/01/2019 2054   BILIRUBINUR NEGATIVE 09/01/2019 2054   [place] NEGATIVE 09/01/2019 2054   PROTEINUR NEGATIVE 09/01/2019 2054   NITRITE NEGATIVE 09/01/2019 2054   LEUKOCYTESUR NEGATIVE 09/01/2019 2054   Sepsis Labs: @LABRCNTIP (procalcitonin:4,lacticidven:4)  ) Recent Results (from the past 240 hour(s))  Resp Panel by RT-PCR (Flu A&B, Covid) Nasopharyngeal Swab     Status: None   Collection Time: 09/03/21  6:36 PM   Specimen: Nasopharyngeal Swab; Nasopharyngeal(NP) swabs in vial transport medium  Result Value Ref Range Status   SARS Coronavirus 2 by RT PCR NEGATIVE NEGATIVE Final    Comment: (NOTE) SARS-CoV-2 target nucleic acids are NOT DETECTED.  The SARS-CoV-2 RNA is generally detectable in upper respiratory specimens during the acute phase of infection. The lowest concentration of SARS-CoV-2 viral copies this assay can detect is 138 copies/mL. A negative result does not preclude SARS-Cov-2 infection and should not be used as the sole basis for treatment or other patient management decisions. A negative result may occur with  improper specimen collection/handling, submission of specimen other than nasopharyngeal swab, presence of viral mutation(s) within the areas targeted by this assay, and inadequate number of viral copies(<138 copies/mL). A negative result must be combined with clinical observations, patient history, and epidemiological information.  The expected result is Negative.  Fact Sheet for Patients:  EntrepreneurPulse.com.au  Fact Sheet for Healthcare Providers:  IncredibleEmployment.be  This test is no t yet approved or cleared by the [place] FDA and  has been authorized for detection and/or diagnosis of SARS-CoV-2 by FDA under an Emergency Use Authorization (EUA). This EUA will remain  in effect (meaning this test can be used) for the duration of the COVID-19 declaration under Section 564(b)(1) of the Act,  21 U.S.C.section 360bbb-3(b)(1), unless the authorization is terminated  or revoked sooner.       Influenza A by PCR NEGATIVE NEGATIVE Final   Influenza B by PCR NEGATIVE NEGATIVE Final    Comment: (NOTE) The Xpert Xpress SARS-CoV-2/FLU/RSV plus assay is intended as an aid in the diagnosis of influenza from Nasopharyngeal swab specimens and should not be used as a sole basis for treatment. Nasal washings and aspirates are unacceptable for Xpert Xpress SARS-CoV-2/FLU/RSV testing.  Fact Sheet for Patients: EntrepreneurPulse.com.au  Fact Sheet for Healthcare Providers: IncredibleEmployment.be  This test is not yet approved or cleared by the [place] FDA and has been authorized for detection and/or diagnosis of SARS-CoV-2 by FDA under an Emergency Use Authorization (EUA). This EUA will remain in effect (meaning this test can be used) for the duration of the COVID-19 declaration under Section 564(b)(1) of the Act, 21 U.S.C. section 360bbb-3(b)(1), unless the authorization is terminated or revoked.  Performed at [facility], [place] [address], [place], [place] [address]   Culture, blood (routine x 2)     Status: None (Preliminary result)   Collection Time: 09/04/21  7:35 PM   Specimen: BLOOD RIGHT HAND  Result Value Ref Range Status   Specimen Description BLOOD RIGHT HAND  Final   Special Requests   Final    BOTTLES DRAWN AEROBIC AND ANAEROBIC Blood Culture adequate volume   Culture   Final    NO GROWTH 4 DAYS Performed at [facility], [place] [address] [place], [place] [address]    Report Status PENDING  Incomplete  Culture, blood (routine x 2)     Status: None (Preliminary result)   Collection Time: 09/04/21  7:43 PM   Specimen: BLOOD  Result Value Ref Range Status   Specimen Description BLOOD LEFT ANTECUBITAL  Final   Special Requests   Final    BOTTLES DRAWN AEROBIC AND ANAEROBIC Blood Culture adequate  volume   Culture   Final    NO GROWTH 4 DAYS Performed at [facility], [place] [address] [place], [place] [address]    Report Status PENDING  Incomplete      Studies: No results found.  Scheduled Meds:  aspirin  325 mg Oral BID   azithromycin  500 mg Oral Daily   celecoxib  200 mg Oral BID   cholecalciferol  1,000 Units Oral Daily   clozapine  50 mg Oral TID   docusate sodium  100 mg Oral BID   metoprolol tartrate  12.5 mg Oral BID   oxcarbazepine  600 mg Oral BID   senna-docusate  1 tablet Oral BID    Continuous Infusions:  cefTRIAXone (ROCEPHIN)  IV 2 g (09/08/21 0941)   methocarbamol (ROBAXIN) IV       LOS: 5 days     Alma Friendly, MD [facility]  If 7PM-7AM, please contact night-coverage [email or web address] 09/08/2021, 2:13 PM

## 2021-09-08 NOTE — Progress Notes (Signed)
Occupational Therapy Treatment Patient Details Name: Julian Gray MRN: [id number] DOB: 03/21/1986 Today's Date: 09/08/2021   History of present illness Pt is a 36 y.o. male admitted 09/03/21 after twisting his leg in parking lot sustaining L tibial shaft and fibula fx. S/p L tibial IMN 2/2. 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   OT comments  Pt seen today for session with focus on transfers. Pt very anxious regarding mobility, requiring +2-3 people and increased time to perform stand pivot transfer to chair. Pt crying during session and becoming agitated with attempted use of gait belt, pt ripped off LLE bandage, RN aware. Pt currently mod-max A +2 for ADLs, min guard for bed mobility, and max A +2 for transfer. Pt presenting with impairments below, will follow acutely. Recommend d/c home with assistance.   Recommendations for follow up therapy are one component of a multi-disciplinary discharge planning process, led by the attending physician.  Recommendations may be updated based on patient status, additional functional criteria and insurance authorization.    Follow Up Recommendations  No OT follow up    Assistance Recommended at Discharge Frequent or constant Supervision/Assistance  Patient can return home with the following  Two people to help with walking and/or transfers;Two people to help with bathing/dressing/bathroom;Assistance with cooking/housework;Assist for transportation;Help with stairs or ramp for entrance   Equipment Recommendations  BSC/3in1;Wheelchair (measurements OT);Wheelchair cushion (measurements OT)    Recommendations for Other Services      Precautions / Restrictions Precautions Precautions: Fall Required Braces or Orthoses: Other Brace Other Brace: L CAM boot Restrictions Weight Bearing Restrictions: Yes LLE Weight Bearing: Weight bearing as tolerated Other  Position/Activity Restrictions: in cam boot       Mobility Bed Mobility Overal bed mobility: Needs Assistance Bed Mobility: Supine to Sit     Supine to sit: Min guard     General bed mobility comments: increased cuing, min gaurd to provide tactile cue to RLE to initiate moving to EOB    Transfers Overall transfer level: Needs assistance Equipment used: 2 person hand held assist Transfers: Sit to/from Stand, Bed to chair/wheelchair/BSC Sit to Stand: Max assist, +2 physical assistance, From elevated surface Stand pivot transfers: Max assist, +2 physical assistance               Balance Overall balance assessment: Needs assistance Sitting-balance support: No upper extremity supported Sitting balance-Leahy Scale: Fair Sitting balance - Comments: posterior leaning while sitting on EOB   Standing balance support: Bilateral upper extremity supported, During functional activity Standing balance-Leahy Scale: Poor Standing balance comment: reliant on external support and limited standing tolerance                           ADL either performed or assessed with clinical judgement   ADL Overall ADL's : At baseline;Needs assistance/impaired Eating/Feeding: Set up;Sitting                   Lower Body Dressing: Minimal assistance;Bed level Lower Body Dressing Details (indicate cue type and reason): donning socks in bed, able to simulate Toilet Transfer: Maximal assistance;+2 for physical assistance Toilet Transfer Details (indicate cue type and reason): +2-3 pt's mother helping for support/comfort as pt crying and emotional during session         Functional mobility during ADLs: Moderate assistance;Maximal assistance;+2 for physical assistance      Extremity/Trunk Assessment Upper Extremity Assessment Upper  Extremity Assessment: Difficult to assess due to impaired cognition;Overall [facility] for tasks assessed   Lower Extremity Assessment Lower Extremity  Assessment: Defer to PT evaluation        Vision   Vision Assessment?: No apparent visual deficits   Perception Perception Perception: Not tested   Praxis Praxis Praxis: Not tested    Cognition Arousal/Alertness: Awake/alert Behavior During Therapy: WFL for tasks assessed/performed, Agitated, Restless, Impulsive Overall Cognitive Status: History of cognitive impairments - at baseline                                          Exercises      Shoulder Instructions       General Comments      Pertinent Vitals/ Pain       Pain Assessment Pain Assessment: Faces Faces Pain Scale: Hurts a little bit Pain Location: L LE Pain Descriptors / Indicators: Grimacing Pain Intervention(s): Limited activity within patient's tolerance, Monitored during session, Premedicated before session, Repositioned  Home Living                                          Prior Functioning/Environment              Frequency  Min 2X/week        Progress Toward Goals  OT Goals(current goals can now be found in the care plan section)  Progress towards OT goals: Progressing toward goals  Acute Rehab OT Goals OT Goal Formulation: With patient/family Time For Goal Achievement: 09/19/21 Potential to Achieve Goals: Good ADL Goals Pt Will Transfer to Toilet: with mod assist;ambulating;bedside commode  Plan Discharge plan remains appropriate    Co-evaluation    PT/OT/SLP Co-Evaluation/Treatment: Yes Reason for Co-Treatment: For patient/therapist safety;Necessary to address cognition/behavior during functional activity;To address functional/ADL transfers          AM-PAC OT "6 Clicks" Daily Activity     Outcome Measure   Help from another person eating meals?: A Lot Help from another person taking care of personal grooming?: A Lot Help from another person toileting, which includes using toliet, bedpan, or urinal?: Total Help from another person  bathing (including washing, rinsing, drying)?: Total Help from another person to put on and taking off regular upper body clothing?: Total Help from another person to put on and taking off regular lower body clothing?: Total 6 Click Score: 8    End of Session Equipment Utilized During Treatment: Gait belt  OT Visit Diagnosis: Unsteadiness on feet (R26.81)   Activity Tolerance Treatment limited secondary to agitation   Patient Left in chair;with call bell/phone within reach;with chair alarm set;with nursing/sitter in room;with family/visitor present   Nurse Communication Mobility status;Patient requests pain meds;Other (comment) (RN notified pt ripped off LLE bandage, requested to reapply)        Time: 5852-7782 OT Time Calculation (min): 31 min  Charges: OT General Charges $OT Visit: 1 Visit OT Treatments $Self Care/Home Management : 8-22 mins  Alfonzo Beers, OTD, OTR/L Acute Rehab [phone]   Mayer Masker 09/08/2021, 11:54 AM

## 2021-09-09 ENCOUNTER — Inpatient Hospital Stay (HOSPITAL_COMMUNITY): Payer: Medicare Other

## 2021-09-09 LAB — CULTURE, BLOOD (ROUTINE X 2)
Culture: NO GROWTH
Culture: NO GROWTH
Special Requests: ADEQUATE
Special Requests: ADEQUATE

## 2021-09-09 LAB — CBC WITH DIFFERENTIAL/PLATELET
Abs Immature Granulocytes: 0.05 10*3/uL (ref 0.00–0.07)
Basophils Absolute: 0 10*3/uL (ref 0.0–0.1)
Basophils Relative: 0 %
Eosinophils Absolute: 0.6 10*3/uL — ABNORMAL HIGH (ref 0.0–0.5)
Eosinophils Relative: 6 %
HCT: 32.5 % — ABNORMAL LOW (ref 39.0–52.0)
Hemoglobin: 11.3 g/dL — ABNORMAL LOW (ref 13.0–17.0)
Immature Granulocytes: 0 %
Lymphocytes Relative: 13 %
Lymphs Abs: 1.5 10*3/uL (ref 0.7–4.0)
MCH: 30.5 pg (ref 26.0–34.0)
MCHC: 34.8 g/dL (ref 30.0–36.0)
MCV: 87.8 fL (ref 80.0–100.0)
Monocytes Absolute: 1 10*3/uL (ref 0.1–1.0)
Monocytes Relative: 9 %
Neutro Abs: 8 10*3/uL — ABNORMAL HIGH (ref 1.7–7.7)
Neutrophils Relative %: 72 %
Platelets: 199 10*3/uL (ref 150–400)
RBC: 3.7 MIL/uL — ABNORMAL LOW (ref 4.22–5.81)
RDW: 12.5 % (ref 11.5–15.5)
WBC: 11.2 10*3/uL — ABNORMAL HIGH (ref 4.0–10.5)
nRBC: 0 % (ref 0.0–0.2)

## 2021-09-09 LAB — T4, FREE: Free T4: 0.85 ng/dL (ref 0.61–1.12)

## 2021-09-09 LAB — BASIC METABOLIC PANEL
Anion gap: 9 (ref 5–15)
BUN: 14 mg/dL (ref 6–20)
CO2: 28 mmol/L (ref 22–32)
Calcium: 8.8 mg/dL — ABNORMAL LOW (ref 8.9–10.3)
Chloride: 103 mmol/L (ref 98–111)
Creatinine, Ser: 0.78 mg/dL (ref 0.61–1.24)
GFR, Estimated: >60 mL/min (ref >60)
Glucose, Bld: 120 mg/dL — ABNORMAL HIGH (ref 70–99)
Potassium: 4.1 mmol/L (ref 3.5–5.1)
Sodium: 140 mmol/L (ref 135–145)

## 2021-09-09 LAB — TSH: TSH: 5.818 u[IU]/mL — ABNORMAL HIGH (ref 0.350–4.500)

## 2021-09-09 MED ORDER — SORBITOL 70 % SOLN
960.0000 mL | TOPICAL_OIL | Freq: Once | ORAL | Status: AC
  Administered 2021-09-09: 960 mL via RECTAL
  Filled 2021-09-09: qty 473

## 2021-09-09 MED ORDER — POLYETHYLENE GLYCOL 3350 17 G PO PACK
17.0000 g | PACK | Freq: Two times a day (BID) | ORAL | Status: DC
  Administered 2021-09-09 – 2021-09-10 (×3): 17 g via ORAL
  Filled 2021-09-09 (×4): qty 1

## 2021-09-09 MED ORDER — PANTOPRAZOLE SODIUM 40 MG IV SOLR
40.0000 mg | INTRAVENOUS | Status: DC
  Administered 2021-09-09 – 2021-09-10 (×2): 40 mg via INTRAVENOUS
  Filled 2021-09-09 (×2): qty 10

## 2021-09-09 MED ORDER — IOHEXOL 300 MG/ML  SOLN
100.0000 mL | Freq: Once | INTRAMUSCULAR | Status: AC | PRN
  Administered 2021-09-09: 100 mL via INTRAVENOUS

## 2021-09-09 NOTE — Progress Notes (Signed)
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  In the ED x-ray of the left knee was negative for acute fracture.  Left tibia/fibula x-ray showed distal tibia/fibular shaft fractures, mildly displaced.  Orthopedic surgery was consulted.  Patient admitted for further management.    Today, patient noted to have an episode of large emesis, with recently ingested food, brown, no hematemesis noted.  Noted to be much quiet this AM.  Looked somewhat uncomfortable.  Father said projectile vomiting may be related to overfeeding    Assessment/Plan: Principal Problem:   Closed fracture of left lower extremity, initial encounter Active Problems:   Intellectual disability   Left distal tibial shaft fracture, mildly displaced S/p intramedullary nailing of left tibia fracture on 09/04/2021 Further management including pain management, DVT PPx as per orthopedics PT/OT- HH  Sepsis likely 2/2 CAP Noted to be tachycardic, tachypneic, febrile with leukocytosis noted post op Father reported some mild cough PTA to the hospital Last temp spike on 09/08/2021, 100.5 F  Currently afebrile, with mild leukocytosis UA/UC, never collected BC x2 NGTD CXR showed multifocal bilateral airspace disease, favoring pneumonia CTA chest showed diffuse groundglass opacities throughout both lungs, likely possible atypical pneumonia, hypersensitivity pneumonitis or respiratory bronchiolitis.  No PE seen Continue ceftriaxone, azithromycin for total of 7 days Monitor closely on telemetry  Acute hypoxic respiratory failure Resolved Likely 2/2 above Required as high as 6L of O2 post op, not on any home O2,  currently weaned off O2 Management as above, duo nebs, incentive spirometry if able CT chest as above Supplemental O2 as needed  Nausea/vomiting Recently ingested food as noted above, large volume, brownish Abdominal x-ray, CT abdomen/pelvis both with no obstruction or any other acute findings Both noted large stool burden Continue bowel regimen, SMOG enema once Continue full liquid diets, and upgrade pending bowel movement  Persistent tachycardia Ongoing Could be related to post op pain post, anxiety Vs sepsis Elevated d-dimer (recent surgery) TSH 5.818, free T4 0.85 CTA chest as above Started on low-dose beta-blocker Telemetry  Elevated BP Ongoing Could be related to postop pain, as patient unable to verbalize Started on low-dose BB  Normocytic anemia/acute blood loss anemia Likely from postop in addition to some hemodilution as patient has been on IV fluids Daily CBC  Intellectual disability/2 seizure disorder Continue clozapine, oxcarbazepine, hydroxyzine as needed  Obesity     Estimated body mass index is 30.34 kg/m as calculated from the following:   Height as of this encounter: 6\' 1"  (1.854 m).   Weight as of this encounter: 104.3 kg.     Code Status: Full  Family Communication: Discussed extensively with father/legal guardian at bedside  Disposition Plan: Status is: Inpatient Remains inpatient appropriate because: Level of care    Consultants: Orthopedics  Procedures: S/p intramedullary nailing of left tibia fracture on 09/04/2021  Antimicrobials: Ceftriaxone Azithromycin  DVT prophylaxis: ASA twice daily, SCD   Objective: Vitals:   09/08/21 1146 09/08/21 2010 09/09/21 0728 09/09/21 1230  BP: 123/76 (!) 142/82 125/86 133/89  Pulse: 96  99 99  Resp: 20 17 20 17   Temp: 100.2 F (37.9 C) 98.4 F (36.9 C) 98.3 F (36.8 C)   TempSrc: Axillary Axillary Oral   SpO2: 95%  98% 98%  Weight:      Height:        Intake/Output Summary (Last  24 hours) at 09/09/2021 1815 Last data filed at 09/09/2021 1300 Gross per 24 hour  Intake 0 ml  Output 2300 ml  Net -2300 ml   Filed Weights   09/04/21 0929  Weight: 104.3 kg    Exam: General: NAD, nonverbal, appears comfortable Cardiovascular: S1, S2 present Respiratory: CTAB Abdomen: Soft, nontender, nondistended, bowel sounds present Musculoskeletal: No bilateral pedal edema noted Skin: Normal Psychiatry: Unable to assess    Data Reviewed: CBC: Recent Labs  Lab 09/03/21 1836 09/04/21 1936 09/06/21 0423 09/07/21 0726 09/08/21 0557 09/09/21 0407  WBC 9.5 12.0* 7.7 6.4 7.3 11.2*  NEUTROABS 7.1 9.5*  --  4.0 4.8 8.0*  HGB 13.5 14.4 11.0* 10.7* 10.8* 11.3*  HCT 39.8 42.1 32.5* 31.8* 32.7* 32.5*  MCV 90.7 87.5 90.0 88.8 88.6 87.8  PLT 124* 189 124* 142* 173 [id number]   Basic Metabolic Panel: Recent Labs  Lab 09/03/21 1836 09/04/21 1936 09/06/21 0423 09/07/21 0726 09/09/21 0407  NA 138 136 139 139 140  K 3.9 4.0 3.9 3.7 4.1  CL 108 101 105 106 103  CO2 22 24 28 27 28   GLUCOSE 97 163* 101* 104* 120*  BUN 18 16 25* 23* 14  CREATININE 0.75 1.13 0.91 0.83 0.78  CALCIUM 8.1* 9.1 8.5* 8.3* 8.8*   GFR: Estimated Creatinine Clearance: 166.6 mL/min (by C-G formula based on SCr of 0.78 mg/dL). Liver Function Tests: Recent Labs  Lab 09/03/21 1836  AST 18  ALT 22  ALKPHOS 87  BILITOT 0.5  PROT 6.7  ALBUMIN 3.8   No results for input(s): LIPASE, AMYLASE in the last 168 hours. No results for input(s): AMMONIA in the last 168 hours. Coagulation Profile: No results for input(s): INR, PROTIME in the last 168 hours. Cardiac Enzymes: No results for input(s): CKTOTAL, CKMB, CKMBINDEX, TROPONINI in the last 168 hours. BNP (last 3 results) No results for input(s): PROBNP in the last 8760 hours. HbA1C: No results for input(s): HGBA1C in the last 72 hours. CBG: No results for input(s): GLUCAP in the last 168 hours. Lipid Profile: No results for input(s): CHOL, HDL,  LDLCALC, TRIG, CHOLHDL, LDLDIRECT in the last 72 hours. Thyroid Function Tests: Recent Labs    09/09/21 0407  TSH 5.818*  FREET4 0.85   Anemia Panel: No results for input(s): VITAMINB12, FOLATE, FERRITIN, TIBC, IRON, RETICCTPCT in the last 72 hours. Urine analysis:    Component Value Date/Time   COLORURINE YELLOW 09/01/2019 2054   APPEARANCEUR CLOUDY (A) 09/01/2019 2054   LABSPEC 1.024 09/01/2019 2054   PHURINE 8.0 09/01/2019 2054   GLUCOSEU NEGATIVE 09/01/2019 2054   HGBUR NEGATIVE 09/01/2019 2054   BILIRUBINUR NEGATIVE 09/01/2019 2054   [place] NEGATIVE 09/01/2019 2054   PROTEINUR NEGATIVE 09/01/2019 2054   NITRITE NEGATIVE 09/01/2019 2054   LEUKOCYTESUR NEGATIVE 09/01/2019 2054   Sepsis Labs: @LABRCNTIP (procalcitonin:4,lacticidven:4)  ) Recent Results (from the past 240 hour(s))  Resp Panel by RT-PCR (Flu A&B, Covid) Nasopharyngeal Swab     Status: None   Collection Time: 09/03/21  6:36 PM   Specimen: Nasopharyngeal Swab; Nasopharyngeal(NP) swabs in vial transport medium  Result Value Ref Range Status   SARS Coronavirus 2 by RT PCR NEGATIVE NEGATIVE Final    Comment: (NOTE) SARS-CoV-2 target nucleic acids are NOT DETECTED.  The SARS-CoV-2 RNA is generally detectable in upper respiratory specimens during the acute phase of infection. The lowest concentration of SARS-CoV-2  viral copies this assay can detect is 138 copies/mL. A negative result does not preclude SARS-Cov-2 infection and should not be used as the sole basis for treatment or other patient management decisions. A negative result may occur with  improper specimen collection/handling, submission of specimen other than nasopharyngeal swab, presence of viral mutation(s) within the areas targeted by this assay, and inadequate number of viral copies(<138 copies/mL). A negative result must be combined with clinical observations, patient history, and epidemiological information. The expected result is  Negative.  Fact Sheet for Patients:  EntrepreneurPulse.com.au  Fact Sheet for Healthcare Providers:  IncredibleEmployment.be  This test is no t yet approved or cleared by the [place] FDA and  has been authorized for detection and/or diagnosis of SARS-CoV-2 by FDA under an Emergency Use Authorization (EUA). This EUA will remain  in effect (meaning this test can be used) for the duration of the COVID-19 declaration under Section 564(b)(1) of the Act, 21 U.S.C.section 360bbb-3(b)(1), unless the authorization is terminated  or revoked sooner.       Influenza A by PCR NEGATIVE NEGATIVE Final   Influenza B by PCR NEGATIVE NEGATIVE Final    Comment: (NOTE) The Xpert Xpress SARS-CoV-2/FLU/RSV plus assay is intended as an aid in the diagnosis of influenza from Nasopharyngeal swab specimens and should not be used as a sole basis for treatment. Nasal washings and aspirates are unacceptable for Xpert Xpress SARS-CoV-2/FLU/RSV testing.  Fact Sheet for Patients: EntrepreneurPulse.com.au  Fact Sheet for Healthcare Providers: IncredibleEmployment.be  This test is not yet approved or cleared by the [place] FDA and has been authorized for detection and/or diagnosis of SARS-CoV-2 by FDA under an Emergency Use Authorization (EUA). This EUA will remain in effect (meaning this test can be used) for the duration of the COVID-19 declaration under Section 564(b)(1) of the Act, 21 U.S.C. section 360bbb-3(b)(1), unless the authorization is terminated or revoked.  Performed at [facility], [place] [address] [place], [place] [address]   Culture, blood (routine x 2)     Status: None   Collection Time: 09/04/21  7:35 PM   Specimen: BLOOD RIGHT HAND  Result Value Ref Range Status   Specimen Description BLOOD RIGHT HAND  Final   Special Requests   Final    BOTTLES DRAWN AEROBIC AND ANAEROBIC Blood  Culture adequate volume   Culture   Final    NO GROWTH 5 DAYS Performed at [facility], [place] [address] [place], [place] [address]    Report Status 09/09/2021 FINAL  Final  Culture, blood (routine x 2)     Status: None   Collection Time: 09/04/21  7:43 PM   Specimen: BLOOD  Result Value Ref Range Status   Specimen Description BLOOD LEFT ANTECUBITAL  Final   Special Requests   Final    BOTTLES DRAWN AEROBIC AND ANAEROBIC Blood Culture adequate volume   Culture   Final    NO GROWTH 5 DAYS Performed at [facility], [place] [address] [place], [place] [address]    Report Status 09/09/2021 FINAL  Final      Studies: CT ABDOMEN PELVIS W CONTRAST  Result Date: 09/09/2021 CLINICAL DATA:  Nausea, vomiting EXAM: CT ABDOMEN AND PELVIS WITH CONTRAST TECHNIQUE: Multidetector CT imaging of the abdomen and pelvis was performed using the standard protocol following bolus administration of intravenous contrast. RADIATION DOSE REDUCTION: This exam was performed according to the departmental dose-optimization program which includes automated exposure control, adjustment of the mA and/or  kV according to patient size and/or use of iterative reconstruction technique. CONTRAST:  136mL OMNIPAQUE IOHEXOL 300 MG/ML  SOLN COMPARISON:  09/09/2019 FINDINGS: Lower chest: Faint ground-glass densities seen in the lower lung fields. Hepatobiliary: No focal abnormality is seen in the liver. Gallbladder is unremarkable. Pancreas: No focal abnormality is seen. Spleen: Spleen measures 13.2 cm in maximum diameter. Adrenals/Urinary Tract: Adrenals are unremarkable. There is no hydronephrosis. There are no renal or ureteral stones. Urinary bladder is unremarkable. Stomach/Bowel: There are subcentimeter nodes in the posterior mediastinum adjacent to the distal thoracic esophagus and descending thoracic aorta. There is moderate distention of stomach with air-fluid level. There is no significant dilation of small-bowel  loops. There is fluid in the lumen of proximal duodenum. Cecum is low lying in the left lower quadrant of abdomen. Appendix is not dilated. Large amount of stool is seen in colon and rectum. Transverse diameter of rectum measures 7.6 cm. There is no significant wall thickening in colon and rectum. Vascular/Lymphatic: Vascular structures are unremarkable. There are subcentimeter nodes in mesentery and retroperitoneum. Reproductive: Unremarkable. Other: There is no ascites or pneumoperitoneum. Small umbilical hernia containing fat is seen. Musculoskeletal: Unremarkable IMPRESSION: There is no evidence of intestinal obstruction or pneumoperitoneum. There is no hydronephrosis. Appendix is not dilated. Large amount of stool is seen in the colon with possible fecal impaction in the rectum. Cecum is lying in the left lower quadrant of abdomen. Subtle ground-glass densities in the lower lung fields may be an artifact due to breathing motion or suggest minimal scarring or interstitial pneumonitis. Electronically Signed   By: Elmer Picker M.D.   On: 09/09/2021 12:44   DG Abd Portable 1V  Result Date: 09/09/2021 CLINICAL DATA:  Nausea, vomiting EXAM: PORTABLE ABDOMEN - 1 VIEW COMPARISON:  09/09/2019 FINDINGS: Nonobstructive bowel gas pattern. Moderate volume stool within the colon. No radio-opaque calculi or other significant radiographic abnormality are seen. IMPRESSION: 1. Nonobstructive bowel gas pattern. 2. Moderate volume stool within the colon. Electronically Signed   By: Davina Poke D.O.   On: 09/09/2021 08:00    Scheduled Meds:  aspirin  325 mg Oral BID   azithromycin  500 mg Oral Daily   celecoxib  200 mg Oral BID   cholecalciferol  1,000 Units Oral Daily   clozapine  50 mg Oral TID   docusate sodium  100 mg Oral BID   metoprolol tartrate  12.5 mg Oral BID   oxcarbazepine  600 mg Oral BID   pantoprazole (PROTONIX) IV  40 mg Intravenous Q24H   polyethylene glycol  17 g Oral BID    senna-docusate  1 tablet Oral BID   sorbitol, milk of mag, mineral oil, glycerin (SMOG) enema  960 mL Rectal Once    Continuous Infusions:  cefTRIAXone (ROCEPHIN)  IV 2 g (09/09/21 1241)   methocarbamol (ROBAXIN) IV       LOS: 6 days     Alma Friendly, MD [facility]  If 7PM-7AM, please contact night-coverage [email or web address] 09/09/2021, 6:15 PM

## 2021-09-09 NOTE — Progress Notes (Signed)
Physical Therapy Treatment Patient Details Name: Julian Gray MRN: [id number] DOB: 09/05/1985 Today's Date: 09/09/2021   History of Present Illness Pt is a 36 y.o. male admitted 09/03/21 after twisting his leg in parking lot sustaining L tibial shaft and fibula fx. S/p L tibial IMN 2/2. Course complicated by sepsis secondary to CAP. 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   PT Comments    Pt progressing well with mobility, in better spirits today with better pain control it seems. Pt tolerated OOB transfer and brief ambulation bout with min-modA (+2 safety); father present and able to provide pt with necessary assist. Reviewed educ re: precautions, CAM boot wear, positioning, edema control, therex/ROM, DME needs, activity recommendations. Per father, pt to d/c home tomorrow; he reports no further questions or concerns. Will continue to follow acutely to address established goals.     Recommendations for follow up therapy are one component of a multi-disciplinary discharge planning process, led by the attending physician.  Recommendations may be updated based on patient status, additional functional criteria and insurance authorization.  Follow Up Recommendations  Home health PT     Assistance Recommended at Discharge Frequent or constant Supervision/Assistance  Patient can return home with the following A lot of help with bathing/dressing/bathroom;Assistance with cooking/housework;Assist for transportation;Help with stairs or ramp for entrance;Direct supervision/assist for medications management;A little help with walking and/or transfers   Equipment Recommendations  Rolling walker (2 wheels);BSC/3in1 (reports w/c delivered)    Recommendations for Other Services       Precautions / Restrictions Precautions Precautions: Fall Required Braces or Orthoses: Other Brace Other Brace: L CAM  boot Restrictions Weight Bearing Restrictions: Yes LLE Weight Bearing: Weight bearing as tolerated Other Position/Activity Restrictions: in cam boot     Mobility  Bed Mobility Overal bed mobility: Needs Assistance Bed Mobility: Supine to Sit     Supine to sit: Min assist     General bed mobility comments: MinA for HHA provided by pt's father to elevate trunk    Transfers Overall transfer level: Needs assistance Equipment used: 2 person hand held assist Transfers: Sit to/from Stand Sit to Stand: Min assist, +2 physical assistance, +2 safety/equipment           General transfer comment: 2x sit<>stand from EOB with minA for HHA to power into standing with momentum, pt reaching for additional HHA    Ambulation/Gait Ambulation/Gait assistance: Min assist, +2 physical assistance, +2 safety/equipment Gait Distance (Feet): 4 Feet Assistive device: 2 person hand held assist Gait Pattern/deviations: Step-to pattern, Decreased weight shift to left, Antalgic, Trunk flexed Gait velocity: Decreased     General Gait Details: Pt able to ambulate 4' to recliner with min-modA and bilateral HHA, able to take complete steps with boot donned; pt attempting to sit as soon as he was standing next to chair limiting further ambulation distance; father providing additional assist   Stairs Stairs:  (father reports if they decide against PTAR transport home, pt can scoot up/down steps on buttocks with assist (at baseline, pt scoots down steps at home to reduce fall risk); reviewed safety recmomendations for this technique)           Wheelchair Mobility    Modified Rankin (Stroke Patients Only)       Balance Overall balance assessment: Needs assistance Sitting-balance support: No upper extremity supported Sitting balance-Leahy Scale: Fair Sitting balance - Comments: assist to don L cam boot   Standing balance support:  Bilateral upper extremity supported, During functional  activity Standing balance-Leahy Scale: Poor Standing balance comment: can static stand with single UE support                            Cognition Arousal/Alertness: Awake/alert Behavior During Therapy: WFL for tasks assessed/performed, Impulsive Overall Cognitive Status: History of cognitive impairments - at baseline                                 General Comments: pt pleasant and participatory, initially declining OOB but easily able to be redirected with encouragement from father and PT. pt following majority of simple commands, agreeable to seems pleased with his progress        Exercises General Exercises - Lower Extremity Ankle Circles/Pumps: AROM, Both Long Arc Quad: AROM, Left, Seated Straight Leg Raises: AROM, Left, Seated    General Comments General comments (skin integrity, edema, etc.): pt's father present and supportive. Instructed on how to don cam boot, father able to perform. Father involved with transfer, and able to help pt initiate all mobility (therapist provided +2 assist for safety). Reviewed educ re: precautions, positioning, edema control, AROM/therex, fall risk reduction, DME use. Pt's father reports no further questions or concerns with hopes for d/c tomorrow      Pertinent Vitals/Pain Pain Assessment Pain Assessment: Faces Faces Pain Scale: Hurts a little bit Pain Location: LLE Pain Descriptors / Indicators: Guarding Pain Intervention(s): Monitored during session, Limited activity within patient's tolerance    Home Living                          Prior Function            PT Goals (current goals can now be found in the care plan section) Progress towards PT goals: Progressing toward goals    Frequency    Min 5X/week      PT Plan Current plan remains appropriate    Co-evaluation              AM-PAC PT "6 Clicks" Mobility   Outcome Measure  Help needed turning from your back to your side while  in a flat bed without using bedrails?: A Little Help needed moving from lying on your back to sitting on the side of a flat bed without using bedrails?: A Little Help needed moving to and from a bed to a chair (including a wheelchair)?: A Lot Help needed standing up from a chair using your arms (e.g., wheelchair or bedside chair)?: A Little Help needed to walk in hospital room?: Total Help needed climbing 3-5 steps with a railing? : Total 6 Click Score: 13    End of Session Equipment Utilized During Treatment: Gait belt Activity Tolerance: Patient tolerated treatment well Patient left: in chair;with call bell/phone within reach;with chair alarm set;with family/visitor present Nurse Communication: Mobility status PT Visit Diagnosis: Other abnormalities of gait and mobility (R26.89);Pain Pain - Right/Left: Left Pain - part of body: Leg     Time: 1435-1454 PT Time Calculation (min) (ACUTE ONLY): 19 min  Charges:  $Therapeutic Activity: 8-22 mins                     Ina Homes, PT, DPT Acute Rehabilitation Services  Pager [phone] Office [phone]  Malachy Chamber 09/09/2021, 3:39 PM

## 2021-09-10 LAB — CBC WITH DIFFERENTIAL/PLATELET
Abs Immature Granulocytes: 0.04 10*3/uL (ref 0.00–0.07)
Basophils Absolute: 0 10*3/uL (ref 0.0–0.1)
Basophils Relative: 0 %
Eosinophils Absolute: 0.6 10*3/uL — ABNORMAL HIGH (ref 0.0–0.5)
Eosinophils Relative: 11 %
HCT: 30.7 % — ABNORMAL LOW (ref 39.0–52.0)
Hemoglobin: 10.4 g/dL — ABNORMAL LOW (ref 13.0–17.0)
Immature Granulocytes: 1 %
Lymphocytes Relative: 25 %
Lymphs Abs: 1.4 10*3/uL (ref 0.7–4.0)
MCH: 30 pg (ref 26.0–34.0)
MCHC: 33.9 g/dL (ref 30.0–36.0)
MCV: 88.5 fL (ref 80.0–100.0)
Monocytes Absolute: 0.7 10*3/uL (ref 0.1–1.0)
Monocytes Relative: 12 %
Neutro Abs: 2.9 10*3/uL (ref 1.7–7.7)
Neutrophils Relative %: 51 %
Platelets: 176 10*3/uL (ref 150–400)
RBC: 3.47 MIL/uL — ABNORMAL LOW (ref 4.22–5.81)
RDW: 12.4 % (ref 11.5–15.5)
WBC: 5.7 10*3/uL (ref 4.0–10.5)
nRBC: 0 % (ref 0.0–0.2)

## 2021-09-10 LAB — BASIC METABOLIC PANEL
Anion gap: 11 (ref 5–15)
BUN: 18 mg/dL (ref 6–20)
CO2: 27 mmol/L (ref 22–32)
Calcium: 8.7 mg/dL — ABNORMAL LOW (ref 8.9–10.3)
Chloride: 99 mmol/L (ref 98–111)
Creatinine, Ser: 0.74 mg/dL (ref 0.61–1.24)
GFR, Estimated: >60 mL/min (ref >60)
Glucose, Bld: 93 mg/dL (ref 70–99)
Potassium: 3.8 mmol/L (ref 3.5–5.1)
Sodium: 137 mmol/L (ref 135–145)

## 2021-09-10 MED ORDER — METOPROLOL TARTRATE 25 MG PO TABS: 12.5000 mg | ORAL_TABLET | Freq: Two times a day (BID) | ORAL | 2 refills | Status: AC

## 2021-09-10 MED ORDER — POLYETHYLENE GLYCOL 3350 17 G PO PACK: 17.0000 g | PACK | Freq: Every day | ORAL | 0 refills | Status: DC

## 2021-09-10 MED ORDER — SENNOSIDES-DOCUSATE SODIUM 8.6-50 MG PO TABS: 1.0000 | ORAL_TABLET | Freq: Two times a day (BID) | ORAL | 0 refills | Status: AC

## 2021-09-10 MED ORDER — VITAMIN D3 25 MCG PO TABS: 1000.0000 [IU] | ORAL_TABLET | Freq: Every day | ORAL | 0 refills | Status: AC

## 2021-09-10 NOTE — Discharge Summary (Signed)
Physician Discharge Summary  Julian Gray ZOX:[id number] DOB: 02-Mar-1986 DOA: 09/03/2021  PCP: Gaspar Garbeisovec, Richard W, MD  Admit date: 09/03/2021  Discharge date: 09/10/2021  Admitted From:Home  Disposition:  Home  Recommendations for Outpatient Follow-up:  Follow up with PCP in 1-2 weeks Follow-up with orthopedics as scheduled and continue on pain management and aspirin as prescribed Continue vitamin D supplementation Continue laxation as prescribed Continue beta-blocker and follow-up heart rate and BP control with PCP Continue other home medications as prior  Home Health: Yes with PT, CSW  Equipment/Devices: Rolling walker, bedside commode  Discharge Condition:Stable  CODE STATUS: Full  Diet recommendation: Heart Healthy  Brief/Interim Summary:  36 year old male with medical history of premature birth complicated by anoxic brain injury, intellectual disability disorder, autism spectrum disorder, history of seizure-like activity came to ED after fall with left leg injury.  In the ED x-ray of the left knee was negative for acute fracture.  Left tibia/fibula x-ray showed distal tibia/fibular shaft fractures, mildly displaced.  Orthopedic surgery had performed intramedullary nailing of the left tibia fracture on 09/04/2021.  Postoperatively, patient was noted to have signs and symptoms concerning for pneumonia and had developed sepsis secondary to community-acquired pneumonia.  He was started on azithromycin and Rocephin and has completed a 5-day course of treatment with no further fevers or leukocytosis noted.  His blood cultures have remained negative.  He was also noted to have associated acute hypoxemic respiratory failure that has now resolved.  He did develop some nausea and vomiting with no bowel movements recently and some fecal impaction.  This had all resolved after receiving enema on 2/7 and he is now tolerating diet with no further concerns.  PT recommends home health on discharge with  rolling walker and bedside commode.  He will follow-up with orthopedics outpatient in approximately 2 weeks.  No other acute events noted throughout the course of this admission.  Discharge Diagnoses:  Principal Problem:   Closed fracture of left lower extremity, initial encounter Active Problems:   Intellectual disability  Principal discharge diagnosis: Left distal tibial shaft fracture status post intramedullary nailing 2/2.  Subsequent acute hypoxemic respiratory failure secondary to sepsis from community-acquired pneumonia-resolved.  Discharge Instructions  Discharge Instructions     Diet - low sodium heart healthy   Complete by: As directed    For home use only DME Walker rolling   Complete by: As directed    Walker: With 5 Inch Wheels   Patient needs a walker to treat with the following condition: Weakness   If the dressing is still on your incision site when you go home, remove it on the third day after your surgery date. Remove dressing if it begins to fall off, or if it is dirty or damaged before the third day.   Complete by: As directed    Increase activity slowly   Complete by: As directed       Allergies as of 09/10/2021   No Known Allergies      Medication List     TAKE these medications    acetaminophen 160 MG/5ML solution Commonly known as: TYLENOL Take 20.3 mLs (650 mg total) by mouth every 6 (six) hours as needed for mild pain or fever.   aspirin EC 81 MG tablet Take 1 tablet (81 mg total) by mouth in the morning and at bedtime. Swallow whole.   clozapine 50 MG tablet Commonly known as: CLOZARIL Take 1 tablet (50 mg total) by mouth 3 (three) times daily.  HYDROcodone-acetaminophen 5-325 MG tablet Commonly known as: NORCO/VICODIN Take 1-2 tablets by mouth every 4 (four) hours as needed for moderate pain or severe pain (1 tablet moderate pain, 2 tablets severe pain).   hydrOXYzine 25 MG tablet Commonly known as: ATARAX Take 25 mg by mouth every 6  (six) hours as needed for anxiety.   metoprolol tartrate 25 MG tablet Commonly known as: LOPRESSOR Take 0.5 tablets (12.5 mg total) by mouth 2 (two) times daily.   oxcarbazepine 600 MG tablet Commonly known as: TRILEPTAL Take 1 tablet (600 mg total) by mouth 2 (two) times daily.   polyethylene glycol 17 g packet Commonly known as: MIRALAX / GLYCOLAX Take 17 g by mouth daily.   senna-docusate 8.6-50 MG tablet Commonly known as: Senokot-S Take 1 tablet by mouth 2 (two) times daily.   Vitamin D3 25 MCG tablet Commonly known as: Vitamin D Take 1 tablet (1,000 Units total) by mouth daily.               Durable Medical Equipment  (From admission, onward)           Start     Ordered   09/10/21 0853  DME 3-in-1  Once        09/10/21 0852   09/10/21 0000  For home use only DME Walker rolling       Question Answer Comment  Walker: With 5 Inch Wheels   Patient needs a walker to treat with the following condition Weakness      09/10/21 0852   09/08/21 1113  For home use only DME lightweight manual wheelchair with seat cushion  Once       Comments: Patient suffers from closed fracture L leg which impairs their ability to perform daily activities like bathing, dressing, and toileting in the home.  A walker will not resolve  issue with performing activities of daily living. A wheelchair will allow patient to safely perform daily activities. Patient is not able to propel themselves in the home using a standard weight wheelchair due to general weakness. Patient can self propel in the lightweight wheelchair. Length of need 6 months . Accessories: elevating leg rests (ELRs), wheel locks, extensions and anti-tippers.   09/08/21 1113   09/07/21 0821  For home use only DME 3 n 1  Once        09/07/21 0820              Discharge Care Instructions  (From admission, onward)           Start     Ordered   09/10/21 0000  If the dressing is still on your incision site when you  go home, remove it on the third day after your surgery date. Remove dressing if it begins to fall off, or if it is dirty or damaged before the third day.        09/10/21 6578            Follow-up Information     Haddix, Gillie Manners, MD. Schedule an appointment as soon as possible for a visit in 2 week(s).   Specialty: Orthopedic Surgery Why: for wound check, repeat x-rays Contact information: [address] [place] [address] [phone]         [facility] Follow up.   Why: the office will call to schedule home health visits Contact information: [address] [place], [place] [address] Phone: [phone]  Tisovec, Adelfa Koh, MD. Schedule an appointment as soon as possible for a visit in 1 week(s).   Specialty: Internal Medicine Contact information: [address] [place] [address] [phone]                No Known Allergies  Consultations: Orthopedics   Procedures/Studies: DG Tibia/Fibula Left  Result Date: 09/04/2021 CLINICAL DATA:  Intramedullary nail placement left tibia. EXAM: LEFT TIBIA AND FIBULA - 2 VIEW COMPARISON:  None. FINDINGS: Intraoperative fluoroscopic images for intramedullary nail placement for comminuted displaced left tibial/fibular fractures. Total fluoroscopic time was 1 minutes and 9 seconds. Total fluoroscopic dose was 2.41 mGy. IMPRESSION: Intraoperative utilization of fluoroscopy Electronically Signed   By: Larose Hires D.O.   On: 09/04/2021 11:19   DG Tibia/Fibula Left  Result Date: 09/03/2021 CLINICAL DATA:  Fall, left leg deformity EXAM: LEFT TIBIA AND FIBULA - 2 VIEW COMPARISON:  None. FINDINGS: Mid/distal fibular shaft fracture, with 1 shaft width lateral displacement. Distal tibial shaft fracture, with 1 shaft width lateral displacement. Associated soft tissue swelling/deformity. IMPRESSION: Distal tibial and fibular shaft fractures, mildly displaced.  Electronically Signed   By: Charline Bills M.D.   On: 09/03/2021 19:31   DG Ankle Complete Left  Result Date: 09/03/2021 CLINICAL DATA:  Fall, leg injury EXAM: LEFT ANKLE COMPLETE - 3+ VIEW COMPARISON:  None. FINDINGS: Mid fibular and distal tibial shaft fractures, incompletely visualized. The ankle mortise is intact. The base of the fifth metatarsal is unremarkable. Soft tissue swelling. IMPRESSION: Mid fibular and distal tibial shaft fractures, incompletely visualized. Electronically Signed   By: Charline Bills M.D.   On: 09/03/2021 19:30   CT Angio Chest Pulmonary Embolism (PE) W or WO Contrast  Result Date: 09/05/2021 CLINICAL DATA:  Pulmonary embolism suspected. EXAM: CT ANGIOGRAPHY CHEST WITH CONTRAST TECHNIQUE: Multidetector CT imaging of the chest was performed using the standard protocol during bolus administration of intravenous contrast. Multiplanar CT image reconstructions and MIPs were obtained to evaluate the vascular anatomy. RADIATION DOSE REDUCTION: This exam was performed according to the departmental dose-optimization program which includes automated exposure control, adjustment of the mA and/or kV according to patient size and/or use of iterative reconstruction technique. CONTRAST:  OMNIPAQUE IOHEXOL 350 MG/ML SOLN COMPARISON:  Chest CT dated 08/24/2019 FINDINGS: Cardiovascular: There is no pulmonary embolism identified within the main, lobar or central segmental pulmonary arteries bilaterally. Some of the most peripheral segmental and subsegmental pulmonary arteries are difficult to definitively characterize due to patient breathing motion artifact. No thoracic aortic aneurysm or evidence of aortic dissection. No pericardial effusion. Mediastinum/Nodes: No mass or enlarged lymph nodes are seen within the mediastinum or perihilar regions. Esophagus appears normal on today's exam. Trachea and central bronchi are unremarkable. Lungs/Pleura: Diffuse ground-glass opacities  throughout both lungs, slightly more confluent/nodular within the LEFT lower lobe. No pleural effusion or pneumothorax is seen. Upper Abdomen: Limited images of the upper abdomen are unremarkable. Musculoskeletal: Osseous structures are unremarkable. Review of the MIP images confirms the above findings. IMPRESSION: 1. Diffuse ground-glass opacities throughout both lungs, slightly more confluent/nodular within the LEFT lower lobe. Differential includes atypical pneumonias such as viral or fungal, hypersensitivity pneumonitis, and respiratory bronchiolitis. Differential would include inhalational exposure and COVID pneumonia. 2. No pulmonary embolism seen, with mild study limitations detailed above. Electronically Signed   By: Bary Richard M.D.   On: 09/05/2021 16:46   CT ABDOMEN PELVIS W CONTRAST  Result Date: 09/09/2021 CLINICAL DATA:  Nausea, vomiting EXAM: CT ABDOMEN AND PELVIS WITH CONTRAST  TECHNIQUE: Multidetector CT imaging of the abdomen and pelvis was performed using the standard protocol following bolus administration of intravenous contrast. RADIATION DOSE REDUCTION: This exam was performed according to the departmental dose-optimization program which includes automated exposure control, adjustment of the mA and/or kV according to patient size and/or use of iterative reconstruction technique. CONTRAST:  OMNIPAQUE IOHEXOL 300 MG/ML  SOLN COMPARISON:  09/09/2019 FINDINGS: Lower chest: Faint ground-glass densities seen in the lower lung fields. Hepatobiliary: No focal abnormality is seen in the liver. Gallbladder is unremarkable. Pancreas: No focal abnormality is seen. Spleen: Spleen measures 13.2 cm in maximum diameter. Adrenals/Urinary Tract: Adrenals are unremarkable. There is no hydronephrosis. There are no renal or ureteral stones. Urinary bladder is unremarkable. Stomach/Bowel: There are subcentimeter nodes in the posterior mediastinum adjacent to the distal thoracic esophagus and descending  thoracic aorta. There is moderate distention of stomach with air-fluid level. There is no significant dilation of small-bowel loops. There is fluid in the lumen of proximal duodenum. Cecum is low lying in the left lower quadrant of abdomen. Appendix is not dilated. Large amount of stool is seen in colon and rectum. Transverse diameter of rectum measures 7.6 cm. There is no significant wall thickening in colon and rectum. Vascular/Lymphatic: Vascular structures are unremarkable. There are subcentimeter nodes in mesentery and retroperitoneum. Reproductive: Unremarkable. Other: There is no ascites or pneumoperitoneum. Small umbilical hernia containing fat is seen. Musculoskeletal: Unremarkable IMPRESSION: There is no evidence of intestinal obstruction or pneumoperitoneum. There is no hydronephrosis. Appendix is not dilated. Large amount of stool is seen in the colon with possible fecal impaction in the rectum. Cecum is lying in the left lower quadrant of abdomen. Subtle ground-glass densities in the lower lung fields may be an artifact due to breathing motion or suggest minimal scarring or interstitial pneumonitis. Electronically Signed   By: Ernie Avena M.D.   On: 09/09/2021 12:44   DG Chest Port 1 View  Result Date: 09/04/2021 CLINICAL DATA:  Fever EXAM: PORTABLE CHEST 1 VIEW COMPARISON:  09/11/2019 FINDINGS: Single frontal view of the chest demonstrates a stable cardiac silhouette. Interval removal of the esophageal stent. There is multifocal bilateral airspace disease in a perihilar distribution. No effusion or pneumothorax. No acute bony abnormalities. IMPRESSION: 1. Multifocal bilateral airspace disease, favor pneumonia given clinical history. Electronically Signed   By: Sharlet Salina M.D.   On: 09/04/2021 18:07   DG Knee Complete 4 Views Left  Result Date: 09/03/2021 CLINICAL DATA:  Fall, leg injury EXAM: LEFT KNEE - COMPLETE 4+ VIEW COMPARISON:  None. FINDINGS: No fracture or dislocation is seen.  The joint spaces are preserved. Visualized soft tissues are within normal limits. No suprapatellar knee joint effusion. IMPRESSION: Negative. Electronically Signed   By: Charline Bills M.D.   On: 09/03/2021 19:29   DG Tibia/Fibula Left Port  Result Date: 09/04/2021 CLINICAL DATA:  Left tibia ORIF. EXAM: PORTABLE LEFT TIBIA AND FIBULA - 2 VIEW COMPARISON:  Left tibia and fibula x-rays from yesterday. FINDINGS: Interval intramedullary rod fixation of the comminuted distal tibial diaphyseal fracture, now in near anatomic alignment. Improved alignment of the comminuted mid to distal fibular diaphyseal fracture with residual 5 mm posterior displacement. Diffuse soft tissue swelling. Small amount of postsurgical intra-articular air within the knee joint. IMPRESSION: 1. Interval ORIF of the distal tibial diaphyseal fracture, now in near anatomic alignment. 2. Improved alignment of the comminuted mid to distal fibular diaphyseal fracture. Electronically Signed   By: Obie Dredge M.D.   On: 09/04/2021  13:59   DG Abd Portable 1V  Result Date: 09/09/2021 CLINICAL DATA:  Nausea, vomiting EXAM: PORTABLE ABDOMEN - 1 VIEW COMPARISON:  09/09/2019 FINDINGS: Nonobstructive bowel gas pattern. Moderate volume stool within the colon. No radio-opaque calculi or other significant radiographic abnormality are seen. IMPRESSION: 1. Nonobstructive bowel gas pattern. 2. Moderate volume stool within the colon. Electronically Signed   By: Duanne Guess D.O.   On: 09/09/2021 08:00   DG C-Arm 1-60 Min-No Report  Result Date: 09/04/2021 Fluoroscopy was utilized by the requesting physician.  No radiographic interpretation.     Discharge Exam: Vitals:   09/10/21 0415 09/10/21 0738  BP: 124/72 115/83  Pulse: 95 73  Resp: 17 18  Temp: 97.8 F (36.6 C) 97.7 F (36.5 C)  SpO2: 94% 95%   Vitals:   09/09/21 0728 09/09/21 1230 09/10/21 0415 09/10/21 0738  BP: 125/86 133/89 124/72 115/83  Pulse: 99 99 95 73  Resp: 20 17 17  18   Temp: 98.3 F (36.8 C)  97.8 F (36.6 C) 97.7 F (36.5 C)  TempSrc: Oral  Axillary Oral  SpO2: 98% 98% 94% 95%  Weight:      Height:        General: Pt is alert, awake, not in acute distress Cardiovascular: RRR, S1/S2 +, no rubs, no gallops Respiratory: CTA bilaterally, no wheezing, no rhonchi Abdominal: Soft, NT, ND, bowel sounds + Extremities: no edema, no cyanosis, left lower extremity incisions clean dry and intact    The results of significant diagnostics from this hospitalization (including imaging, microbiology, ancillary and laboratory) are listed below for reference.     Microbiology: Recent Results (from the past 240 hour(s))  Resp Panel by RT-PCR (Flu A&B, Covid) Nasopharyngeal Swab     Status: None   Collection Time: 09/03/21  6:36 PM   Specimen: Nasopharyngeal Swab; Nasopharyngeal(NP) swabs in vial transport medium  Result Value Ref Range Status   SARS Coronavirus 2 by RT PCR NEGATIVE NEGATIVE Final    Comment: (NOTE) SARS-CoV-2 target nucleic acids are NOT DETECTED.  The SARS-CoV-2 RNA is generally detectable in upper respiratory specimens during the acute phase of infection. The lowest concentration of SARS-CoV-2 viral copies this assay can detect is 138 copies/mL. A negative result does not preclude SARS-Cov-2 infection and should not be used as the sole basis for treatment or other patient management decisions. A negative result may occur with  improper specimen collection/handling, submission of specimen other than nasopharyngeal swab, presence of viral mutation(s) within the areas targeted by this assay, and inadequate number of viral copies(<138 copies/mL). A negative result must be combined with clinical observations, patient history, and epidemiological information. The expected result is Negative.  Fact Sheet for Patients:  BloggerCourse.com  Fact Sheet for Healthcare Providers:   SeriousBroker.it  This test is no t yet approved or cleared by the [place] FDA and  has been authorized for detection and/or diagnosis of SARS-CoV-2 by FDA under an Emergency Use Authorization (EUA). This EUA will remain  in effect (meaning this test can be used) for the duration of the COVID-19 declaration under Section 564(b)(1) of the Act, 21 U.S.C.section 360bbb-3(b)(1), unless the authorization is terminated  or revoked sooner.       Influenza A by PCR NEGATIVE NEGATIVE Final   Influenza B by PCR NEGATIVE NEGATIVE Final    Comment: (NOTE) The Xpert Xpress SARS-CoV-2/FLU/RSV plus assay is intended as an aid in the diagnosis of influenza from Nasopharyngeal swab specimens and should not be used as  a sole basis for treatment. Nasal washings and aspirates are unacceptable for Xpert Xpress SARS-CoV-2/FLU/RSV testing.  Fact Sheet for Patients: BloggerCourse.com  Fact Sheet for Healthcare Providers: SeriousBroker.it  This test is not yet approved or cleared by the [place] FDA and has been authorized for detection and/or diagnosis of SARS-CoV-2 by FDA under an Emergency Use Authorization (EUA). This EUA will remain in effect (meaning this test can be used) for the duration of the COVID-19 declaration under Section 564(b)(1) of the Act, 21 U.S.C. section 360bbb-3(b)(1), unless the authorization is terminated or revoked.  Performed at [facility], [address] [place], [place] [address]   Culture, blood (routine x 2)     Status: None   Collection Time: 09/04/21  7:35 PM   Specimen: BLOOD RIGHT HAND  Result Value Ref Range Status   Specimen Description BLOOD RIGHT HAND  Final   Special Requests   Final    BOTTLES DRAWN AEROBIC AND ANAEROBIC Blood Culture adequate volume   Culture   Final    NO GROWTH 5 DAYS Performed at [facility], [address]  [place], [place] [address]    Report Status 09/09/2021 FINAL  Final  Culture, blood (routine x 2)     Status: None   Collection Time: 09/04/21  7:43 PM   Specimen: BLOOD  Result Value Ref Range Status   Specimen Description BLOOD LEFT ANTECUBITAL  Final   Special Requests   Final    BOTTLES DRAWN AEROBIC AND ANAEROBIC Blood Culture adequate volume   Culture   Final    NO GROWTH 5 DAYS Performed at [facility], [address] [place], [place] [address]    Report Status 09/09/2021 FINAL  Final     Labs: BNP (last 3 results) No results for input(s): BNP in the last 8760 hours. Basic Metabolic Panel: Recent Labs  Lab 09/04/21 1936 09/06/21 0423 09/07/21 0726 09/09/21 0407 09/10/21 0143  NA 136 139 139 140 137  K 4.0 3.9 3.7 4.1 3.8  CL 101 105 106 103 99  CO2 24 28 27 28 27   GLUCOSE 163* 101* 104* 120* 93  BUN 16 25* 23* 14 18  CREATININE 1.13 0.91 0.83 0.78 0.74  CALCIUM 9.1 8.5* 8.3* 8.8* 8.7*   Liver Function Tests: Recent Labs  Lab 09/03/21 1836  AST 18  ALT 22  ALKPHOS 87  BILITOT 0.5  PROT 6.7  ALBUMIN 3.8   No results for input(s): LIPASE, AMYLASE in the last 168 hours. No results for input(s): AMMONIA in the last 168 hours. CBC: Recent Labs  Lab 09/04/21 1936 09/06/21 0423 09/07/21 0726 09/08/21 0557 09/09/21 0407 09/10/21 0609  WBC 12.0* 7.7 6.4 7.3 11.2* 5.7  NEUTROABS 9.5*  --  4.0 4.8 8.0* 2.9  HGB 14.4 11.0* 10.7* 10.8* 11.3* 10.4*  HCT 42.1 32.5* 31.8* 32.7* 32.5* 30.7*  MCV 87.5 90.0 88.8 88.6 87.8 88.5  PLT 189 124* 142* 173 199 176   Cardiac Enzymes: No results for input(s): CKTOTAL, CKMB, CKMBINDEX, TROPONINI in the last 168 hours. BNP: Invalid input(s): POCBNP CBG: No results for input(s): GLUCAP in the last 168 hours. D-Dimer No results for input(s): DDIMER in the last 72 hours. Hgb A1c No results for input(s): HGBA1C in the last 72 hours. Lipid Profile No results for input(s): CHOL, HDL, LDLCALC, TRIG, CHOLHDL, LDLDIRECT  in the last 72 hours. Thyroid function studies Recent Labs    09/09/21 0407  TSH 5.818*  Anemia work up No results for input(s): VITAMINB12, FOLATE, FERRITIN, TIBC, IRON, RETICCTPCT in the last 72 hours. Urinalysis    Component Value Date/Time   COLORURINE YELLOW 09/01/2019 2054   APPEARANCEUR CLOUDY (A) 09/01/2019 2054   LABSPEC 1.024 09/01/2019 2054   PHURINE 8.0 09/01/2019 2054   GLUCOSEU NEGATIVE 09/01/2019 2054   HGBUR NEGATIVE 09/01/2019 2054   BILIRUBINUR NEGATIVE 09/01/2019 2054   KETONESUR NEGATIVE 09/01/2019 2054   PROTEINUR NEGATIVE 09/01/2019 2054   NITRITE NEGATIVE 09/01/2019 2054   LEUKOCYTESUR NEGATIVE 09/01/2019 2054   Sepsis Labs Invalid input(s): PROCALCITONIN,  WBC,  LACTICIDVEN Microbiology Recent Results (from the past 240 hour(s))  Resp Panel by RT-PCR (Flu A&B, Covid) Nasopharyngeal Swab     Status: None   Collection Time: 09/03/21  6:36 PM   Specimen: Nasopharyngeal Swab; Nasopharyngeal(NP) swabs in vial transport medium  Result Value Ref Range Status   SARS Coronavirus 2 by RT PCR NEGATIVE NEGATIVE Final    Comment: (NOTE) SARS-CoV-2 target nucleic acids are NOT DETECTED.  The SARS-CoV-2 RNA is generally detectable in upper respiratory specimens during the acute phase of infection. The lowest concentration of SARS-CoV-2 viral copies this assay can detect is 138 copies/mL. A negative result does not preclude SARS-Cov-2 infection and should not be used as the sole basis for treatment or other patient management decisions. A negative result may occur with  improper specimen collection/handling, submission of specimen other than nasopharyngeal swab, presence of viral mutation(s) within the areas targeted by this assay, and inadequate number of viral copies(<138 copies/mL). A negative result must be combined with clinical observations, patient history, and epidemiological information. The expected result is Negative.  Fact Sheet for Patients:   BloggerCourse.comhttps://www.fda.gov/media/152166/download  Fact Sheet for Healthcare Providers:  SeriousBroker.ithttps://www.fda.gov/media/152162/download  This test is no t yet approved or cleared by the [place] FDA and  has been authorized for detection and/or diagnosis of SARS-CoV-2 by FDA under an Emergency Use Authorization (EUA). This EUA will remain  in effect (meaning this test can be used) for the duration of the COVID-19 declaration under Section 564(b)(1) of the Act, 21 U.S.C.section 360bbb-3(b)(1), unless the authorization is terminated  or revoked sooner.       Influenza A by PCR NEGATIVE NEGATIVE Final   Influenza B by PCR NEGATIVE NEGATIVE Final    Comment: (NOTE) The Xpert Xpress SARS-CoV-2/FLU/RSV plus assay is intended as an aid in the diagnosis of influenza from Nasopharyngeal swab specimens and should not be used as a sole basis for treatment. Nasal washings and aspirates are unacceptable for Xpert Xpress SARS-CoV-2/FLU/RSV testing.  Fact Sheet for Patients: BloggerCourse.comhttps://www.fda.gov/media/152166/download  Fact Sheet for Healthcare Providers: [email or web address]://www.fda.gov/media/152162/download  This test is not yet approved or cleared by the [place] FDA and has been authorized for detection and/or diagnosis of SARS-CoV-2 by FDA under an Emergency Use Authorization (EUA). This EUA will remain in effect (meaning this test can be used) for the duration of the COVID-19 declaration under Section 564(b)(1) of the Act, 21 U.S.C. section 360bbb-3(b)(1), unless the authorization is terminated or revoked.  Performed at [facility], [address] [place], [place] [id number]   Culture, blood (routine x 2)     Status: None   Collection Time: 09/04/21  7:35 PM   Specimen: BLOOD RIGHT HAND  Result Value Ref Range Status   Specimen Description BLOOD RIGHT HAND  Final   Special Requests   Final    BOTTLES DRAWN AEROBIC AND ANAEROBIC Blood Culture adequate volume   Culture  Final    NO GROWTH 5 DAYS Performed at [facility], [address], [place], [place] [address]    Report Status 09/09/2021 FINAL  Final  Culture, blood (routine x 2)     Status: None   Collection Time: 09/04/21  7:43 PM   Specimen: BLOOD  Result Value Ref Range Status   Specimen Description BLOOD LEFT ANTECUBITAL  Final   Special Requests   Final    BOTTLES DRAWN AEROBIC AND ANAEROBIC Blood Culture adequate volume   Culture   Final    NO GROWTH 5 DAYS Performed at [facility], [address], [place], [place] [address]    Report Status 09/09/2021 FINAL  Final     Time coordinating discharge: 35 minutes  SIGNED:   Erick Blinks, DO [facility] 09/10/2021, 8:53 AM  If 7PM-7AM, please contact night-coverage [email or web address]

## 2021-09-10 NOTE — Care Management Important Message (Signed)
Important Message  Patient Details  Name: Julian Gray MRN: [id number] Date of Birth: 04/03/1986   Medicare Important Message Given:  Yes     Sherilyn Banker 09/10/2021, 4:00 PM

## 2021-09-10 NOTE — TOC Transition Note (Signed)
Transition of Care [facility]) - CM/SW Discharge Note   Patient Details  Name: Julian Gray MRN: [id number] Date of Birth: Sep 26, 1985  Transition of Care [facility]) CM/SW Contact:  Sharin Mons, RN Phone Number: 09/10/2021, 2:40 PM   Clinical Narrative:    Patient will DC to: Home Anticipated DC date: 09/10/2021 Family notified: MOM Transport by: Corey Harold   Per MD patient ready for DC today . RN, patient, patient's family, and  [facility] notified of DC. [facility] transport requested for patient.  Mom without Rx med concerns. Post hospital f/u noted on AVS.  Julian Gray (Mother)     [phone]     RNCM will sign off for now as intervention is no longer needed. Please consult [place] again if new needs arise.   Final next level of care: [place] Barriers to Discharge: No Barriers Identified   Patient Goals and CMS Choice Patient states their goals for this hospitalization and ongoing recovery are:: return home with parents CMS Medicare.gov Compare Post Acute Care list provided to:: Legal Guardian (Mom) Choice offered to / list presented to : Parent  Discharge Placement                       Discharge Plan and Services   Discharge Planning Services: CM Consult Post Acute Care Choice: Durable Medical Equipment, Home Health          DME Arranged: Walker rolling DME Agency: [employer] Date DME Agency Contacted: 09/10/21 Time DME Agency Contacted: 1440 Representative spoke with at DME Agency: Freda Munro Ulmer: PT, Social Work [employer] Agency: [place] ([place]) Date [place]: 09/08/21 Time [place]: 1124 Representative spoke with at [place]: [place] ([place]) Interventions     Readmission Risk Interventions No flowsheet data found.

## 2021-09-10 NOTE — Progress Notes (Signed)
Occupational Therapy Treatment Patient Details Name: Julian Gray MRN: [id number] DOB: 1985/10/23 Today's Date: 09/10/2021   History of present illness Pt is a 36 y.o. male admitted 09/03/21 after twisting his leg in parking lot sustaining L tibial shaft and fibula fx. S/p L tibial IMN 2/2. Course complicated by sepsis secondary to CAP. 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   OT comments  Pt improving towards goals this session, requiring supervision for bed mobility, min-mod A +2 for transfers and ADLs. Pt able to take 4-5 steps forward toward bathroom with RW and chair follow, requires increased cues to stand upright and to reach back for chair, pt sits quickly and without warning. Educated parent/pt on compensatory strategies for dressing and showering, and using BSC as shower chair. Pt presenting with impairments listed below, will continue to follow acutely.   Recommendations for follow up therapy are one component of a multi-disciplinary discharge planning process, led by the attending physician.  Recommendations may be updated based on patient status, additional functional criteria and insurance authorization.    Follow Up Recommendations  No OT follow up    Assistance Recommended at Discharge Frequent or constant Supervision/Assistance  Patient can return home with the following  Assistance with cooking/housework;Assist for transportation;Help with stairs or ramp for entrance;A lot of help with walking and/or transfers;A lot of help with bathing/dressing/bathroom   Equipment Recommendations  BSC/3in1;Wheelchair (measurements OT);Wheelchair cushion (measurements OT);Other (comment) (RW)    Recommendations for Other Services      Precautions / Restrictions Precautions Precautions: Fall Required Braces or Orthoses: Other Brace Other Brace: L CAM boot Restrictions Weight Bearing  Restrictions: Yes LLE Weight Bearing: Weight bearing as tolerated Other Position/Activity Restrictions: in cam boot       Mobility Bed Mobility Overal bed mobility: Needs Assistance Bed Mobility: Supine to Sit     Supine to sit: Supervision     General bed mobility comments: increased cuing to sit EOB    Transfers Overall transfer level: Needs assistance Equipment used: 2 person hand held assist, Rolling walker (2 wheels) Transfers: Sit to/from Stand Sit to Stand: Min assist, +2 physical assistance, +2 safety/equipment     Step pivot transfers: Min assist, Mod assist, +2 physical assistance     General transfer comment: sit to stand x2, chair transfer x1, able to take 4-5 steps with RW toward bathroom before needing seated rest break     Balance Overall balance assessment: Needs assistance Sitting-balance support: No upper extremity supported Sitting balance-Leahy Scale: Good     Standing balance support: Bilateral upper extremity supported, During functional activity Standing balance-Leahy Scale: Poor                             ADL either performed or assessed with clinical judgement   ADL Overall ADL's : Needs assistance/impaired                     Lower Body Dressing: Moderate assistance;Sitting/lateral leans Lower Body Dressing Details (indicate cue type and reason): able to pull up socks once donned Toilet Transfer: Minimal assistance;+2 for physical assistance;+2 for safety/equipment Toilet Transfer Details (indicate cue type and reason): +2 hand held assist for comfort/safety         Functional mobility during ADLs: Minimal assistance;Moderate assistance      Extremity/Trunk Assessment Upper Extremity Assessment Upper Extremity Assessment: Overall WFL for tasks assessed  Lower Extremity Assessment Lower Extremity Assessment: Defer to PT evaluation        Vision   Vision Assessment?: No apparent visual deficits    Perception Perception Perception: Not tested   Praxis Praxis Praxis: Not tested    Cognition Arousal/Alertness: Awake/alert Behavior During Therapy: WFL for tasks assessed/performed, Impulsive Overall Cognitive Status: History of cognitive impairments - at baseline                                 General Comments: participatory this session, eager to mobilize OOB        Exercises      Shoulder Instructions       General Comments pt's mother present during session, provided ADL education    Pertinent Vitals/ Pain       Pain Assessment Pain Assessment: No/denies pain  Home Living                                          Prior Functioning/Environment              Frequency  Min 2X/week        Progress Toward Goals  OT Goals(current goals can now be found in the care plan section)  Progress towards OT goals: Progressing toward goals  Acute Rehab OT Goals OT Goal Formulation: With patient Time For Goal Achievement: 09/19/21 Potential to Achieve Goals: Good ADL Goals Pt Will Transfer to Toilet: with mod assist;ambulating;bedside commode  Plan Discharge plan remains appropriate    Co-evaluation                 AM-PAC OT "6 Clicks" Daily Activity     Outcome Measure   Help from another person eating meals?: A Lot Help from another person taking care of personal grooming?: A Lot Help from another person toileting, which includes using toliet, bedpan, or urinal?: Total Help from another person bathing (including washing, rinsing, drying)?: Total Help from another person to put on and taking off regular upper body clothing?: A Lot Help from another person to put on and taking off regular lower body clothing?: A Lot 6 Click Score: 10    End of Session Equipment Utilized During Treatment: Gait belt  OT Visit Diagnosis: Unsteadiness on feet (R26.81)   Activity Tolerance Patient tolerated treatment well   Patient  Left in chair;with call bell/phone within reach;with chair alarm set;with family/visitor present   Nurse Communication Mobility status        Time: 3846-6599 OT Time Calculation (min): 36 min  Charges: OT General Charges $OT Visit: 1 Visit OT Treatments $Self Care/Home Management : 23-37 mins  Alfonzo Beers, OTD, OTR/L Acute Rehab [phone]   Mayer Masker 09/10/2021, 10:43 AM

## 2021-09-10 NOTE — Progress Notes (Signed)
Discharge paperwork reviewed with Father of Julian Gray. Father and Mother have been present though out this pts stay.

## 2021-11-24 ENCOUNTER — Encounter (HOSPITAL_COMMUNITY): Payer: Self-pay | Admitting: Emergency Medicine

## 2021-11-24 ENCOUNTER — Emergency Department (HOSPITAL_COMMUNITY): Payer: Medicare Other

## 2021-11-24 ENCOUNTER — Inpatient Hospital Stay (HOSPITAL_COMMUNITY)
Admission: EM | Admit: 2021-11-24 | Discharge: 2021-11-28 | DRG: 493 | Disposition: A | Discharge status: Home Health | Payer: Medicare Other | Attending: Internal Medicine | Admitting: Internal Medicine

## 2021-11-24 DIAGNOSIS — W010XXA Fall on same level from slipping, tripping and stumbling without subsequent striking against object, initial encounter: Secondary | ICD-10-CM | POA: Diagnosis present

## 2021-11-24 DIAGNOSIS — W19XXXA Unspecified fall, initial encounter: Secondary | ICD-10-CM | POA: Diagnosis not present

## 2021-11-24 DIAGNOSIS — Z23 Encounter for immunization: Secondary | ICD-10-CM | POA: Diagnosis present

## 2021-11-24 DIAGNOSIS — R112 Nausea with vomiting, unspecified: Secondary | ICD-10-CM | POA: Diagnosis not present

## 2021-11-24 DIAGNOSIS — Z9281 Personal history of extracorporeal membrane oxygenation (ECMO): Secondary | ICD-10-CM

## 2021-11-24 DIAGNOSIS — S8291XA Unspecified fracture of right lower leg, initial encounter for closed fracture: Secondary | ICD-10-CM | POA: Diagnosis not present

## 2021-11-24 DIAGNOSIS — T40695A Adverse effect of other narcotics, initial encounter: Secondary | ICD-10-CM | POA: Diagnosis not present

## 2021-11-24 DIAGNOSIS — D62 Acute posthemorrhagic anemia: Secondary | ICD-10-CM | POA: Diagnosis not present

## 2021-11-24 DIAGNOSIS — F79 Unspecified intellectual disabilities: Secondary | ICD-10-CM

## 2021-11-24 DIAGNOSIS — Z79899 Other long term (current) drug therapy: Secondary | ICD-10-CM | POA: Diagnosis not present

## 2021-11-24 DIAGNOSIS — F840 Autistic disorder: Secondary | ICD-10-CM | POA: Diagnosis present

## 2021-11-24 DIAGNOSIS — Z87310 Personal history of (healed) osteoporosis fracture: Secondary | ICD-10-CM | POA: Diagnosis present

## 2021-11-24 DIAGNOSIS — S82231A Displaced oblique fracture of shaft of right tibia, initial encounter for closed fracture: Principal | ICD-10-CM | POA: Diagnosis present

## 2021-11-24 DIAGNOSIS — S82201A Unspecified fracture of shaft of right tibia, initial encounter for closed fracture: Secondary | ICD-10-CM | POA: Diagnosis not present

## 2021-11-24 DIAGNOSIS — M84461A Pathological fracture, right tibia, initial encounter for fracture: Secondary | ICD-10-CM | POA: Diagnosis present

## 2021-11-24 DIAGNOSIS — G40909 Epilepsy, unspecified, not intractable, without status epilepticus: Secondary | ICD-10-CM | POA: Diagnosis present

## 2021-11-24 DIAGNOSIS — S82831A Other fracture of upper and lower end of right fibula, initial encounter for closed fracture: Secondary | ICD-10-CM | POA: Diagnosis present

## 2021-11-24 DIAGNOSIS — Y92002 Bathroom of unspecified non-institutional (private) residence single-family (private) house as the place of occurrence of the external cause: Secondary | ICD-10-CM | POA: Diagnosis not present

## 2021-11-24 DIAGNOSIS — S82301A Unspecified fracture of lower end of right tibia, initial encounter for closed fracture: Secondary | ICD-10-CM

## 2021-11-24 DIAGNOSIS — Z9181 History of falling: Secondary | ICD-10-CM

## 2021-11-24 LAB — CBC WITH DIFFERENTIAL/PLATELET
Abs Immature Granulocytes: 0.03 10*3/uL (ref 0.00–0.07)
Basophils Absolute: 0 10*3/uL (ref 0.0–0.1)
Basophils Relative: 0 %
Eosinophils Absolute: 0.1 10*3/uL (ref 0.0–0.5)
Eosinophils Relative: 1 %
HCT: 46 % (ref 39.0–52.0)
Hemoglobin: 15 g/dL (ref 13.0–17.0)
Immature Granulocytes: 0 %
Lymphocytes Relative: 10 %
Lymphs Abs: 1.1 10*3/uL (ref 0.7–4.0)
MCH: 28.7 pg (ref 26.0–34.0)
MCHC: 32.6 g/dL (ref 30.0–36.0)
MCV: 88 fL (ref 80.0–100.0)
Monocytes Absolute: 0.7 10*3/uL (ref 0.1–1.0)
Monocytes Relative: 7 %
Neutro Abs: 8.8 10*3/uL — ABNORMAL HIGH (ref 1.7–7.7)
Neutrophils Relative %: 82 %
Platelets: 211 10*3/uL (ref 150–400)
RBC: 5.23 MIL/uL (ref 4.22–5.81)
RDW: 12.5 % (ref 11.5–15.5)
WBC: 10.7 10*3/uL — ABNORMAL HIGH (ref 4.0–10.5)
nRBC: 0 % (ref 0.0–0.2)

## 2021-11-24 LAB — BASIC METABOLIC PANEL
Anion gap: 10 (ref 5–15)
BUN: 12 mg/dL (ref 6–20)
CO2: 27 mmol/L (ref 22–32)
Calcium: 9.7 mg/dL (ref 8.9–10.3)
Chloride: 101 mmol/L (ref 98–111)
Creatinine, Ser: 0.91 mg/dL (ref 0.61–1.24)
GFR, Estimated: >60 mL/min (ref >60)
Glucose, Bld: 105 mg/dL — ABNORMAL HIGH (ref 70–99)
Potassium: 4.2 mmol/L (ref 3.5–5.1)
Sodium: 138 mmol/L (ref 135–145)

## 2021-11-24 LAB — TYPE AND SCREEN
ABO/RH(D): A POS
Antibody Screen: NEGATIVE

## 2021-11-24 MED ORDER — ACETAMINOPHEN 650 MG RE SUPP: 650.0000 mg | Freq: Four times a day (QID) | RECTAL | Status: DC | PRN

## 2021-11-24 MED ORDER — CHLORHEXIDINE GLUCONATE 4 % EX LIQD
60.0000 mL | Freq: Once | CUTANEOUS | Status: DC
  Filled 2021-11-24: qty 60

## 2021-11-24 MED ORDER — POLYETHYLENE GLYCOL 3350 17 G PO PACK: 17.0000 g | PACK | Freq: Every day | ORAL | Status: DC | PRN

## 2021-11-24 MED ORDER — MORPHINE SULFATE (PF) 4 MG/ML IV SOLN
4.0000 mg | Freq: Once | INTRAVENOUS | Status: AC
  Administered 2021-11-24: 4 mg via INTRAMUSCULAR
  Filled 2021-11-24: qty 1

## 2021-11-24 MED ORDER — ACETAMINOPHEN 325 MG PO TABS
650.0000 mg | ORAL_TABLET | Freq: Four times a day (QID) | ORAL | Status: DC | PRN
  Administered 2021-11-25: 650 mg via ORAL
  Filled 2021-11-24: qty 2

## 2021-11-24 MED ORDER — POVIDONE-IODINE 10 % EX SWAB
2.0000 "application " | Freq: Once | CUTANEOUS | Status: AC
  Administered 2021-11-25: 2 via TOPICAL

## 2021-11-24 MED ORDER — CEFAZOLIN SODIUM-DEXTROSE 2-4 GM/100ML-% IV SOLN
2.0000 g | INTRAVENOUS | Status: AC
  Administered 2021-11-25: 2 g via INTRAVENOUS
  Filled 2021-11-24: qty 100

## 2021-11-24 MED ORDER — FENTANYL CITRATE PF 50 MCG/ML IJ SOSY: 50.0000 ug | PREFILLED_SYRINGE | Freq: Once | INTRAMUSCULAR | Status: DC

## 2021-11-24 MED ORDER — OXCARBAZEPINE 300 MG PO TABS
600.0000 mg | ORAL_TABLET | Freq: Two times a day (BID) | ORAL | Status: DC
  Administered 2021-11-24 – 2021-11-28 (×8): 600 mg via ORAL
  Filled 2021-11-24 (×10): qty 2

## 2021-11-24 MED ORDER — HYDROMORPHONE HCL 1 MG/ML IJ SOLN
0.5000 mg | INTRAMUSCULAR | Status: DC | PRN
  Administered 2021-11-25 – 2021-11-26 (×3): 1 mg via INTRAVENOUS
  Filled 2021-11-24 (×3): qty 1

## 2021-11-24 MED ORDER — HYDROCODONE-ACETAMINOPHEN 5-325 MG PO TABS
1.0000 | ORAL_TABLET | Freq: Four times a day (QID) | ORAL | Status: DC | PRN
  Administered 2021-11-24: 1 via ORAL
  Filled 2021-11-24: qty 1

## 2021-11-24 MED ORDER — SODIUM CHLORIDE 0.9% FLUSH
3.0000 mL | Freq: Two times a day (BID) | INTRAVENOUS | Status: DC
  Administered 2021-11-24 – 2021-11-28 (×8): 3 mL via INTRAVENOUS

## 2021-11-24 MED ORDER — HYDROXYZINE HCL 25 MG PO TABS: 25.0000 mg | ORAL_TABLET | Freq: Four times a day (QID) | ORAL | Status: DC | PRN

## 2021-11-24 MED ORDER — CLOZAPINE 25 MG PO TABS
50.0000 mg | ORAL_TABLET | Freq: Three times a day (TID) | ORAL | Status: DC
  Administered 2021-11-24 – 2021-11-28 (×11): 50 mg via ORAL
  Filled 2021-11-24 (×15): qty 2

## 2021-11-24 NOTE — Progress Notes (Signed)
Orthopedic Tech Progress Note ?Patient Details:  ?Julian Gray ?1986/06/01 ?[id number] ? ?Ortho Devices ?Type of Ortho Device: Stirrup splint, Short leg splint ?Ortho Device/Splint Location: RLE ?Ortho Device/Splint Interventions: Ordered, Application, Adjustment ?  ?Post Interventions ?Patient Tolerated: Well ?Instructions Provided: Care of device ? ?Janit Pagan ?11/24/2021, 5:21 PM ? ?

## 2021-11-24 NOTE — ED Provider Notes (Signed)
?[facility] EMERGENCY DEPARTMENT ?Provider Note ? ? ?CSN: [id number] ?Arrival date & time: 11/24/21  1400 ? ?  ? ?History ? ?Chief Complaint  ?Patient presents with  ? Fall  ? Leg Injury  ? ? ?Julian Gray is a 36 y.o. male. ? ?36 year old male with prior medical history as detailed below presents for evaluation.  Patient fell at home.  He apparently tripped and then went down hard on his right leg.  Patient with complaints of pain to the right lower extremity. ? ?Patient with cognitive delay.  He is unable to provide significant history. ? ?Mother provided history to providers today. ? ?The history is provided by the patient, a parent and medical records.  ?Fall ?This is a new problem. The current episode started 1 to 2 hours ago. The problem occurs rarely. The problem has not changed since onset.Pertinent negatives include no chest pain, no abdominal pain, no headaches and no shortness of breath. Nothing aggravates the symptoms. Nothing relieves the symptoms.  ? ?  ? ?Home Medications ?Prior to Admission medications   ?Medication Sig Start Date End Date Taking? Authorizing Provider  ?acetaminophen (TYLENOL) 160 MG/5ML solution Take 20.3 mLs (650 mg total) by mouth every 6 (six) hours as needed for mild pain or fever. ?Patient not taking: Reported on 09/03/2021 09/12/19   Liborio Nixon, MD  ?clozapine (CLOZARIL) 50 MG tablet Take 1 tablet (50 mg total) by mouth 3 (three) times daily. 09/12/19   Liborio Nixon, MD  ?HYDROcodone-acetaminophen (NORCO/VICODIN) 5-325 MG tablet Take 1-2 tablets by mouth every 4 (four) hours as needed for moderate pain or severe pain (1 tablet moderate pain, 2 tablets severe pain). 09/08/21   West Bali, PA-C  ?hydrOXYzine (ATARAX) 25 MG tablet Take 25 mg by mouth every 6 (six) hours as needed for anxiety. 09/02/21   [provider]  ?metoprolol tartrate (LOPRESSOR) 25 MG tablet Take 0.5 tablets (12.5 mg total) by mouth 2 (two) times daily. 09/10/21 10/10/21  Sherryll Burger,  Pratik D, DO  ?oxcarbazepine (TRILEPTAL) 600 MG tablet Take 1 tablet (600 mg total) by mouth 2 (two) times daily. 09/12/19   Liborio Nixon, MD  ?polyethylene glycol (MIRALAX / GLYCOLAX) 17 g packet Take 17 g by mouth daily. 09/10/21   Maurilio Lovely D, DO  ?   ? ?Allergies    ?Patient has no known allergies.   ? ?Review of Systems   ?Review of Systems  ?Respiratory:  Negative for shortness of breath.   ?Cardiovascular:  Negative for chest pain.  ?Gastrointestinal:  Negative for abdominal pain.  ?Neurological:  Negative for headaches.  ?All other systems reviewed and are negative. ? ?Physical Exam ?Updated Vital Signs ?BP 130/85   Pulse (!) 55   Temp (!) 97.4 ?F (36.3 ?C) (Oral)   Resp 18   SpO2 100%  ?Physical Exam ?Vitals and nursing note reviewed.  ?Constitutional:   ?   General: He is not in acute distress. ?   Appearance: Normal appearance. He is well-developed.  ?HENT:  ?   Head: Normocephalic and atraumatic.  ?Eyes:  ?   Conjunctiva/sclera: Conjunctivae normal.  ?   Pupils: Pupils are equal, round, and reactive to light.  ?Cardiovascular:  ?   Rate and Rhythm: Normal rate and regular rhythm.  ?   Heart sounds: Normal heart sounds.  ?Pulmonary:  ?   Effort: Pulmonary effort is normal. No respiratory distress.  ?   Breath sounds: Rhonchi present.  ?Abdominal:  ?  General: There is no distension.  ?   Palpations: Abdomen is soft.  ?   Tenderness: There is no abdominal tenderness.  ?Musculoskeletal:     ?   General: Tenderness present. No deformity. Normal range of motion.  ?   Cervical back: Normal range of motion and neck supple.  ?   Comments: Fuhs tenderness to the right lower extremity from the knee distally. ? ?Distal right lower extremity is neurovascular intact. ? ?No significant bony deformity noted on exam. ? ?No break in skin noted on exam.  ?Skin: ?   General: Skin is warm and dry.  ?Neurological:  ?   General: No focal deficit present.  ?   Mental Status: He is alert and oriented to person, place, and  time.  ? ? ?ED Results / Procedures / Treatments   ?Labs ?(all labs ordered are listed, but only abnormal results are displayed) ?Labs Reviewed - No data to display ? ?EKG ?None ? ?Radiology ?DG Tibia/Fibula Right ? ?Result Date: 11/24/2021 ?CLINICAL DATA:  Fall. EXAM: RIGHT TIBIA AND FIBULA - 2 VIEW COMPARISON:  None. FINDINGS: There is an acute oblique fracture through the distal tibial diaphysis. There is 6 mm of lateral distraction of the distal fracture fragment. No significant angulation. There is an acute oblique fracture through the proximal fibular diaphysis. There is 1 shaft with lateral displacement of the distal fracture fragment with mild apex lateral angulation. There is no evidence for dislocation. Joint spaces are maintained. There is soft tissue swelling surrounding the fractures. IMPRESSION: 1. Acute fracture of the distal tibial diaphysis. 2. Acute fracture of the proximal fibular diaphysis. Electronically Signed   By: Darliss CheneyAmy  Guttmann M.D.   On: 11/24/2021 15:37  ? ?DG Ankle Complete Right ? ?Result Date: 11/24/2021 ?CLINICAL DATA:  Tripped and fell when going to the bathroom. EXAM: RIGHT ANKLE - COMPLETE 3+ VIEW COMPARISON:  None. FINDINGS: There is an acute, comminuted obliquely oriented fracture deformity involving the distal diaphysis of the tibia. There is posterior and lateral displacement of the distal fracture fragments by approximately 7 mm. IMPRESSION: Acute, comminuted obliquely oriented fracture deformity involves the distal diaphysis of the tibia. Electronically Signed   By: Signa Kellaylor  Stroud M.D.   On: 11/24/2021 15:40   ? ?Procedures ?Procedures  ? ? ?Medications Ordered in ED ?Medications  ?morphine (PF) 4 MG/ML injection 4 mg (has no administration in time range)  ? ? ?ED Course/ Medical Decision Making/ A&P ?  ?                        ?Medical Decision Making ?Amount and/or Complexity of Data Reviewed ?Labs: ordered. ? ?Risk ?Prescription drug management. ?Decision regarding  hospitalization. ? ? ? ?Medical Screen Complete ? ?This patient presented to the ED with complaint of right leg injury after fall from standing. ? ?This complaint involves an extensive number of treatment options. The initial differential diagnosis includes, but is not limited to, extremity fracture, etc. ? ?This presentation is: Acute, Self-Limited, Previously Undiagnosed, Uncertain Prognosis, and Complicated ? ?Patient with cognitive delay and is nonverbal at baseline.  He presents after fall at home where he went down hard on the right leg.  Mother is concerned that the patient has an injury to the right lower extremity. ? ?X-ray demonstrate acute fracture of the right tibia and fibula. ? ?Orthopedics consulted.  There is a plan for operative intervention tomorrow.  Patient to be admitted for pain control -- patient to  be n.p.o. past midnight. ? ?Hospitalist services were case and will evaluate. ? ? ?Co morbidities that complicated the patient's evaluation ? ?Cognitive delay, nonverbal ? ? ?Additional history obtained: ? ?Additional history obtained from Family ?External records from outside sources obtained and reviewed including prior ED visits and prior Inpatient records.  ? ? ?Lab Tests: ? ?I ordered and personally interpreted labs.  The pertinent results include: CBC, BMP ? ? ?Imaging Studies ordered: ? ?I ordered imaging studies including right lower extremity imaging ?I independently visualized and interpreted obtained imaging which showed acute fracture of tibia and fibula ?I agree with the radiologist interpretation. ? ? ?Medicines ordered: ? ?I ordered medication including morphine for pain ?Reevaluation of the patient after these medicines showed that the patient: improved ? ?Consultations Obtained: ? ?I consulted Earney Hamburg, orthopedics,  and discussed lab and imaging findings as well as pertinent plan of care.  ? ? ?Problem List / ED Course: ? ?Acute fracture right tibia right  fibula ? ? ?Reevaluation: ? ?After the interventions noted above, I reevaluated the patient and found that they have: stayed the same ? ? ?Disposition: ? ?After consideration of the diagnostic results and the patients response to tr

## 2021-11-24 NOTE — ED Notes (Signed)
Dinner tray ordered.

## 2021-11-24 NOTE — ED Triage Notes (Signed)
Pt arrives via EMS from home after a trip and fall when going to the bathroom, witnessed by his father. EMS reports possible deformity to right lower leg. Pt has hx of cognitive delay, mother at bedside.  ?

## 2021-11-24 NOTE — H&P (Signed)
?History and Physical  ? ?Julian Gray KGU:[id number] DOB: [birth year] DOA: 11/24/2021 ? ?PCP: Gaspar Garbe, MD  ? ?Patient coming from: Home ? ?Chief Complaint: Fall ? ?HPI: Julian Gray is a 36 y.o. male with medical history significant of perinatal anoxic-ischemic brain injury, intellectual disability, autism spectrum, seizure disorder, history of esophageal perforation and pneumomediastinum who presents following a fall at home. ? ?Due to patient's baseline intellectual disability and minimally verbal status history obtained with assistance of chart review and family.  Patient was walking to the bathroom and pivoting around the couch when he fell hard to the floor earlier today.  After the fall he is noted to have significant pain in his right lower extremity. ? ?ED Course: Vital signs in the ED significant for temperature of 97.4, heart rate in the 50s to 60s.  Lab work-up includes BMP and CBC and type and screen which are all pending still.  Right tib-fib x-ray showed acute fracture of the distal tibial diaphysis and an acute fracture of the proximal fibular diaphysis.  Right ankle x-ray again demonstrated fracture involving the tibial diaphysis.  Patient received dose of morphine in the ED. ? ?Review of Systems: Unable to obtain full review of systems due to patient's baseline intellectual disability. ? ?Past Medical History:  ?Diagnosis Date  ? Epilepsy (HCC)   ? Mental retardation   ? Personal history of extracorporeal membrane oxygenation (ECMO)   ? ? ?Past Surgical History:  ?Procedure Laterality Date  ? CHEST TUBE INSERTION Right 08/24/2019  ? Procedure: CHEST TUBE INSERTION;  Surgeon: Corliss Skains, MD;  Location: [facility] OR;  Service: Thoracic;  Laterality: Right;  ? ESOPHAGOGASTRODUODENOSCOPY Right 08/24/2019  ? Procedure: ESOPHAGOGASTRODUODENOSCOPY (EGD) percutaneous gastrostomy tube placement, esophageal stent placement, right chest tube placement;  Surgeon: Corliss Skains, MD;   Location: [facility] OR;  Service: Thoracic;  Laterality: Right;  ? ESOPHAGOGASTRODUODENOSCOPY N/A 09/11/2019  ? Procedure: ESOPHAGOGASTRODUODENOSCOPY (EGD) esophageal stent removal, esophagram.  will need C-arm, and omniview oral contrast;  Surgeon: Corliss Skains, MD;  Location: [facility] OR;  Service: Thoracic;  Laterality: N/A;  ? IR REPLC GASTRO/COLONIC TUBE PERCUT W/FLUORO  09/10/2019  ? REMOVAL OF GASTROSTOMY TUBE N/A 08/24/2019  ? Procedure: PERCUTANEOUS INSERTION OF GASTROSTOMY TUBE;  Surgeon: Corliss Skains, MD;  Location: [facility] OR;  Service: Thoracic;  Laterality: N/A;  ? TIBIA IM NAIL INSERTION Left 09/04/2021  ? Procedure: INTRAMEDULLARY (IM) NAIL TIBIAL;  Surgeon: Roby Lofts, MD;  Location: [facility] OR;  Service: Orthopedics;  Laterality: Left;  ? VIDEO BRONCHOSCOPY  09/11/2019  ? Procedure: Video Bronchoscopy;  Surgeon: Corliss Skains, MD;  Location: [facility] OR;  Service: Thoracic;;  ? ? ?Social History ? reports that he has never smoked. He has never used smokeless tobacco. He reports that he does not drink alcohol and does not use drugs. ? ?No Known Allergies ? ?Family History  ?Problem Relation Age of Onset  ? Ataxia Neg Hx   ? Chorea Neg Hx   ? Dementia Neg Hx   ? Mental retardation Neg Hx   ? Migraines Neg Hx   ? Multiple sclerosis Neg Hx   ? Neurofibromatosis Neg Hx   ? Neuropathy Neg Hx   ? Parkinsonism Neg Hx   ? Seizures Neg Hx   ? Stroke Neg Hx   ?Reviewed on admission ? ?Prior to Admission medications   ?Medication Sig Start Date End Date Taking? Authorizing Provider  ?acetaminophen (TYLENOL) 160 MG/5ML solution Take 20.3 mLs (650 mg  total) by mouth every 6 (six) hours as needed for mild pain or fever. ?Patient not taking: Reported on 09/03/2021 09/12/19   Liborio NixonPatel, Janki Y, MD  ?clozapine (CLOZARIL) 50 MG tablet Take 1 tablet (50 mg total) by mouth 3 (three) times daily. 09/12/19   Liborio NixonPatel, Janki Y, MD  ?HYDROcodone-acetaminophen (NORCO/VICODIN) 5-325 MG tablet Take 1-2 tablets by mouth every 4 (four) hours as  needed for moderate pain or severe pain (1 tablet moderate pain, 2 tablets severe pain). 09/08/21   West BaliMcClung, Sarah A, PA-C  ?hydrOXYzine (ATARAX) 25 MG tablet Take 25 mg by mouth every 6 (six) hours as needed for anxiety. 09/02/21   [provider]  ?metoprolol tartrate (LOPRESSOR) 25 MG tablet Take 0.5 tablets (12.5 mg total) by mouth 2 (two) times daily. 09/10/21 10/10/21  Sherryll BurgerShah, Pratik D, DO  ?oxcarbazepine (TRILEPTAL) 600 MG tablet Take 1 tablet (600 mg total) by mouth 2 (two) times daily. 09/12/19   Liborio NixonPatel, Janki Y, MD  ?polyethylene glycol (MIRALAX / GLYCOLAX) 17 g packet Take 17 g by mouth daily. 09/10/21   Maurilio LovelyShah, Pratik D, DO  ? ? ?Physical Exam: ?Vitals:  ? 11/24/21 1413 11/24/21 1445 11/24/21 1734  ?BP: 130/85  137/84  ?Pulse: (!) 55  65  ?Resp: 18  17  ?Temp:  (!) 97.4 ?F (36.3 ?C)   ?TempSrc:  Oral   ?SpO2: 100%  100%  ? ? ?Physical Exam ?Constitutional:   ?   General: He is not in acute distress. ?   Appearance: Normal appearance.  ?HENT:  ?   Head: Normocephalic and atraumatic.  ?   Mouth/Throat:  ?   Mouth: Mucous membranes are moist.  ?   Pharynx: Oropharynx is clear.  ?Eyes:  ?   Extraocular Movements: Extraocular movements intact.  ?   Pupils: Pupils are equal, round, and reactive to light.  ?Cardiovascular:  ?   Rate and Rhythm: Normal rate and regular rhythm.  ?   Pulses: Normal pulses.  ?   Heart sounds: Normal heart sounds.  ?Pulmonary:  ?   Effort: Pulmonary effort is normal. No respiratory distress.  ?   Breath sounds: Normal breath sounds.  ?Abdominal:  ?   General: Bowel sounds are normal. There is no distension.  ?   Palpations: Abdomen is soft.  ?   Tenderness: There is no abdominal tenderness.  ?Musculoskeletal:     ?   General: No swelling or deformity.  ?   Comments: Splint in place RLE  ?Skin: ?   General: Skin is warm and dry.  ?Neurological:  ?   General: No focal deficit present.  ?   Mental Status: Mental status is at baseline.  ? ?Labs on Admission: I have personally reviewed  following labs and imaging studies ? ?CBC: ?No results for input(s): WBC, NEUTROABS, HGB, HCT, MCV, PLT in the last 168 hours. ? ?Basic Metabolic Panel: ?No results for input(s): NA, K, CL, CO2, GLUCOSE, BUN, CREATININE, CALCIUM, MG, PHOS in the last 168 hours. ? ?GFR: ?CrCl cannot be calculated (Patient's most recent lab result is older than the maximum 21 days allowed.). ? ?Liver Function Tests: ?No results for input(s): AST, ALT, ALKPHOS, BILITOT, PROT, ALBUMIN in the last 168 hours. ? ?Urine analysis: ?   ?Component Value Date/Time  ? COLORURINE YELLOW 09/01/2019 2054  ? APPEARANCEUR CLOUDY (A) 09/01/2019 2054  ? LABSPEC 1.024 09/01/2019 2054  ? PHURINE 8.0 09/01/2019 2054  ? GLUCOSEU NEGATIVE 09/01/2019 2054  ? HGBUR NEGATIVE 09/01/2019 2054  ?  BILIRUBINUR NEGATIVE 09/01/2019 2054  ? KETONESUR NEGATIVE 09/01/2019 2054  ? PROTEINUR NEGATIVE 09/01/2019 2054  ? NITRITE NEGATIVE 09/01/2019 2054  ? LEUKOCYTESUR NEGATIVE 09/01/2019 2054  ? ? ?Radiological Exams on Admission: ?DG Tibia/Fibula Right ? ?Result Date: 11/24/2021 ?CLINICAL DATA:  Fall. EXAM: RIGHT TIBIA AND FIBULA - 2 VIEW COMPARISON:  None. FINDINGS: There is an acute oblique fracture through the distal tibial diaphysis. There is 6 mm of lateral distraction of the distal fracture fragment. No significant angulation. There is an acute oblique fracture through the proximal fibular diaphysis. There is 1 shaft with lateral displacement of the distal fracture fragment with mild apex lateral angulation. There is no evidence for dislocation. Joint spaces are maintained. There is soft tissue swelling surrounding the fractures. IMPRESSION: 1. Acute fracture of the distal tibial diaphysis. 2. Acute fracture of the proximal fibular diaphysis. Electronically Signed   By: Darliss Cheney M.D.   On: 11/24/2021 15:37  ? ?DG Ankle Complete Right ? ?Result Date: 11/24/2021 ?CLINICAL DATA:  Tripped and fell when going to the bathroom. EXAM: RIGHT ANKLE - COMPLETE 3+ VIEW  COMPARISON:  None. FINDINGS: There is an acute, comminuted obliquely oriented fracture deformity involving the distal diaphysis of the tibia. There is posterior and lateral displacement of the distal fracture fragments by a

## 2021-11-24 NOTE — Consult Note (Signed)
Reason for Consult:Right tib/fib fx ?Referring Physician: Kristine Royal ?Time called: 1536 ?Time at bedside: 1557 ? ? ?Julian Gray is an 36 y.o. male.  ?HPI: Julian Gray suffered a mechanical fall at home. He is nonverbal and unable to contribute to history. No family present. X-rays showed a right tib/fib fx and orthopedic surgery was consulted. ? ?Past Medical History:  ?Diagnosis Date  ? Epilepsy (HCC)   ? Mental retardation   ? Personal history of extracorporeal membrane oxygenation (ECMO)   ? ? ?Past Surgical History:  ?Procedure Laterality Date  ? CHEST TUBE INSERTION Right 08/24/2019  ? Procedure: CHEST TUBE INSERTION;  Surgeon: Corliss Skains, MD;  Location: [facility] OR;  Service: Thoracic;  Laterality: Right;  ? ESOPHAGOGASTRODUODENOSCOPY Right 08/24/2019  ? Procedure: ESOPHAGOGASTRODUODENOSCOPY (EGD) percutaneous gastrostomy tube placement, esophageal stent placement, right chest tube placement;  Surgeon: Corliss Skains, MD;  Location: [facility] OR;  Service: Thoracic;  Laterality: Right;  ? ESOPHAGOGASTRODUODENOSCOPY N/A 09/11/2019  ? Procedure: ESOPHAGOGASTRODUODENOSCOPY (EGD) esophageal stent removal, esophagram.  will need C-arm, and omniview oral contrast;  Surgeon: Corliss Skains, MD;  Location: [facility] OR;  Service: Thoracic;  Laterality: N/A;  ? IR REPLC GASTRO/COLONIC TUBE PERCUT W/FLUORO  09/10/2019  ? REMOVAL OF GASTROSTOMY TUBE N/A 08/24/2019  ? Procedure: PERCUTANEOUS INSERTION OF GASTROSTOMY TUBE;  Surgeon: Corliss Skains, MD;  Location: [facility] OR;  Service: Thoracic;  Laterality: N/A;  ? TIBIA IM NAIL INSERTION Left 09/04/2021  ? Procedure: INTRAMEDULLARY (IM) NAIL TIBIAL;  Surgeon: Roby Lofts, MD;  Location: [facility] OR;  Service: Orthopedics;  Laterality: Left;  ? VIDEO BRONCHOSCOPY  09/11/2019  ? Procedure: Video Bronchoscopy;  Surgeon: Corliss Skains, MD;  Location: [facility] OR;  Service: Thoracic;;  ? ? ?Family History  ?Problem Relation Age of Onset  ? Ataxia Neg Hx   ? Chorea Neg Hx   ? Dementia  Neg Hx   ? Mental retardation Neg Hx   ? Migraines Neg Hx   ? Multiple sclerosis Neg Hx   ? Neurofibromatosis Neg Hx   ? Neuropathy Neg Hx   ? Parkinsonism Neg Hx   ? Seizures Neg Hx   ? Stroke Neg Hx   ? ? ?Social History:  reports that he has never smoked. He has never used smokeless tobacco. He reports that he does not drink alcohol and does not use drugs. ? ?Allergies: No Known Allergies ? ?Medications: I have reviewed the patient's current medications. ? ?No results found for this or any previous visit (from the past 48 hour(s)). ? ?DG Tibia/Fibula Right ? ?Result Date: 11/24/2021 ?CLINICAL DATA:  Fall. EXAM: RIGHT TIBIA AND FIBULA - 2 VIEW COMPARISON:  None. FINDINGS: There is an acute oblique fracture through the distal tibial diaphysis. There is 6 mm of lateral distraction of the distal fracture fragment. No significant angulation. There is an acute oblique fracture through the proximal fibular diaphysis. There is 1 shaft with lateral displacement of the distal fracture fragment with mild apex lateral angulation. There is no evidence for dislocation. Joint spaces are maintained. There is soft tissue swelling surrounding the fractures. IMPRESSION: 1. Acute fracture of the distal tibial diaphysis. 2. Acute fracture of the proximal fibular diaphysis. Electronically Signed   By: Darliss Cheney M.D.   On: 11/24/2021 15:37  ? ?DG Ankle Complete Right ? ?Result Date: 11/24/2021 ?CLINICAL DATA:  Tripped and fell when going to the bathroom. EXAM: RIGHT ANKLE - COMPLETE 3+ VIEW COMPARISON:  None. FINDINGS: There is an acute, comminuted  obliquely oriented fracture deformity involving the distal diaphysis of the tibia. There is posterior and lateral displacement of the distal fracture fragments by approximately 7 mm. IMPRESSION: Acute, comminuted obliquely oriented fracture deformity involves the distal diaphysis of the tibia. Electronically Signed   By: Signa Kell M.D.   On: 11/24/2021 15:40   ? ?Review of Systems   ?Unable to perform ROS: Patient nonverbal  ?Blood pressure 130/85, pulse (!) 55, temperature (!) 97.4 ?F (36.3 ?C), temperature source Oral, resp. rate 18, SpO2 100 %. ?Physical Exam ?Constitutional:   ?   General: He is not in acute distress. ?   Appearance: He is well-developed. He is not diaphoretic.  ?HENT:  ?   Head: Normocephalic and atraumatic.  ?Eyes:  ?   General: No scleral icterus.    ?   Right eye: No discharge.     ?   Left eye: No discharge.  ?   Conjunctiva/sclera: Conjunctivae normal.  ?Cardiovascular:  ?   Rate and Rhythm: Normal rate and regular rhythm.  ?Pulmonary:  ?   Effort: Pulmonary effort is normal. No respiratory distress.  ?Musculoskeletal:  ?   Cervical back: Normal range of motion.  ?   Comments: RLE No traumatic wounds, ecchymosis, or rash ? Mild TTP lower leg ? No knee or ankle effusion ? Knee stable to varus/ valgus and anterior/posterior stress ? Sens DPN, SPN, TN could not assess ? Motor EHL, ext, flex, evers grossly intact ? DP 2+, PT 1+, No significant edema  ?Skin: ?   General: Skin is warm and dry.  ?Neurological:  ?   Mental Status: He is alert.  ?Psychiatric:     ?   Mood and Affect: Mood normal.     ?   Behavior: Behavior normal.  ? ? ?Assessment/Plan: ?Right tib/fib fx -- Plan IMN tomorrow by Dr. Carola Frost. Please keep NPO after MN. ? ? ? ?Freeman Caldron, PA-C ?Orthopedic Surgery ?[phone] ?11/24/2021, 4:18 PM  ?

## 2021-11-24 NOTE — ED Provider Notes (Signed)
MSE was initiated and I personally evaluated the patient and placed orders (if any) at  2:40 PM on November 24, 2021. ? ?The patient appears stable so that the remainder of the MSE may be completed by another provider. ? ?MDM ?She arrives to the emergency department after mechanical fall and pain in the right leg.  Arrives in temporary splint by EMS.  No findings on exam to suspect open fracture.  Pulses and sensation are intact.  Patient does have cognitive delay and mom had to step out for an appointment.  I have ordered x-rays and IV pain medication.  Patient is on continuous O2 monitor in the hallway and in view of staff.  ? ?Exam:  ?General: Awake and alert ?Cardio: 2+ DP pulses in the LEs.  ?Resp: Even, unlabored respirations.  ?Extremity: No right knee tenderness. Swelling to the tib/fib region ?  ?Julian Fast, MD ?11/24/21 1442 ? ?

## 2021-11-25 ENCOUNTER — Other Ambulatory Visit: Payer: Self-pay

## 2021-11-25 ENCOUNTER — Inpatient Hospital Stay (HOSPITAL_COMMUNITY): Payer: Medicare Other | Admitting: Anesthesiology

## 2021-11-25 ENCOUNTER — Inpatient Hospital Stay (HOSPITAL_COMMUNITY): Payer: Medicare Other

## 2021-11-25 ENCOUNTER — Encounter (HOSPITAL_COMMUNITY): Payer: Self-pay | Admitting: Internal Medicine

## 2021-11-25 ENCOUNTER — Encounter (HOSPITAL_COMMUNITY)
Admission: EM | Disposition: A | Discharge status: Home Health | Payer: Self-pay | Source: Home / Self Care | Attending: Internal Medicine

## 2021-11-25 DIAGNOSIS — F840 Autistic disorder: Secondary | ICD-10-CM

## 2021-11-25 DIAGNOSIS — S82301A Unspecified fracture of lower end of right tibia, initial encounter for closed fracture: Secondary | ICD-10-CM

## 2021-11-25 DIAGNOSIS — S82201A Unspecified fracture of shaft of right tibia, initial encounter for closed fracture: Secondary | ICD-10-CM

## 2021-11-25 DIAGNOSIS — W19XXXA Unspecified fall, initial encounter: Secondary | ICD-10-CM | POA: Diagnosis not present

## 2021-11-25 HISTORY — PX: TIBIA IM NAIL INSERTION: SHX2516

## 2021-11-25 LAB — COMPREHENSIVE METABOLIC PANEL
ALT: 25 U/L (ref 0–44)
AST: 28 U/L (ref 15–41)
Albumin: 4.3 g/dL (ref 3.5–5.0)
Alkaline Phosphatase: 162 U/L — ABNORMAL HIGH (ref 38–126)
Anion gap: 15 (ref 5–15)
BUN: 16 mg/dL (ref 6–20)
CO2: 20 mmol/L — ABNORMAL LOW (ref 22–32)
Calcium: 9.7 mg/dL (ref 8.9–10.3)
Chloride: 100 mmol/L (ref 98–111)
Creatinine, Ser: 1.04 mg/dL (ref 0.61–1.24)
GFR, Estimated: >60 mL/min (ref >60)
Glucose, Bld: 128 mg/dL — ABNORMAL HIGH (ref 70–99)
Potassium: 4.2 mmol/L (ref 3.5–5.1)
Sodium: 135 mmol/L (ref 135–145)
Total Bilirubin: 0.9 mg/dL (ref 0.3–1.2)
Total Protein: 7 g/dL (ref 6.5–8.1)

## 2021-11-25 LAB — CBC
HCT: 47.4 % (ref 39.0–52.0)
Hemoglobin: 15.8 g/dL (ref 13.0–17.0)
MCH: 29 pg (ref 26.0–34.0)
MCHC: 33.3 g/dL (ref 30.0–36.0)
MCV: 87 fL (ref 80.0–100.0)
Platelets: 249 10*3/uL (ref 150–400)
RBC: 5.45 MIL/uL (ref 4.22–5.81)
RDW: 12.8 % (ref 11.5–15.5)
WBC: 9.7 10*3/uL (ref 4.0–10.5)
nRBC: 0 % (ref 0.0–0.2)

## 2021-11-25 LAB — VITAMIN D 25 HYDROXY (VIT D DEFICIENCY, FRACTURES): Vit D, 25-Hydroxy: 42.52 ng/mL (ref 30–100)

## 2021-11-25 LAB — MAGNESIUM: Magnesium: 2.1 mg/dL (ref 1.7–2.4)

## 2021-11-25 SURGERY — INSERTION, INTRAMEDULLARY ROD, TIBIA
Anesthesia: General | Laterality: Right

## 2021-11-25 MED ORDER — LIDOCAINE 2% (20 MG/ML) 5 ML SYRINGE
INTRAMUSCULAR | Status: DC | PRN
  Administered 2021-11-25: 60 mg via INTRAVENOUS

## 2021-11-25 MED ORDER — ORAL CARE MOUTH RINSE: 15.0000 mL | Freq: Once | OROMUCOSAL | Status: AC

## 2021-11-25 MED ORDER — MIDAZOLAM HCL 2 MG/2ML IJ SOLN
INTRAMUSCULAR | Status: DC | PRN
  Administered 2021-11-25: 2 mg via INTRAVENOUS

## 2021-11-25 MED ORDER — PNEUMOCOCCAL 20-VAL CONJ VACC 0.5 ML IM SUSY
0.5000 mL | PREFILLED_SYRINGE | INTRAMUSCULAR | Status: AC
  Administered 2021-11-26: 0.5 mL via INTRAMUSCULAR
  Filled 2021-11-25: qty 0.5

## 2021-11-25 MED ORDER — FENTANYL CITRATE (PF) 250 MCG/5ML IJ SOLN
INTRAMUSCULAR | Status: DC | PRN
  Administered 2021-11-25: 100 ug via INTRAVENOUS
  Administered 2021-11-25: 50 ug via INTRAVENOUS

## 2021-11-25 MED ORDER — 0.9 % SODIUM CHLORIDE (POUR BTL) OPTIME
TOPICAL | Status: DC | PRN
Start: 2021-11-25 — End: 2021-11-25
  Administered 2021-11-25: 1000 mL

## 2021-11-25 MED ORDER — CEFAZOLIN SODIUM-DEXTROSE 2-4 GM/100ML-% IV SOLN
2.0000 g | Freq: Three times a day (TID) | INTRAVENOUS | Status: AC
  Administered 2021-11-25 – 2021-11-26 (×3): 2 g via INTRAVENOUS
  Filled 2021-11-25 (×3): qty 100

## 2021-11-25 MED ORDER — ROCURONIUM BROMIDE 10 MG/ML (PF) SYRINGE
PREFILLED_SYRINGE | INTRAVENOUS | Status: DC | PRN
  Administered 2021-11-25: 80 mg via INTRAVENOUS

## 2021-11-25 MED ORDER — FENTANYL CITRATE (PF) 250 MCG/5ML IJ SOLN
INTRAMUSCULAR | Status: AC
  Filled 2021-11-25: qty 5

## 2021-11-25 MED ORDER — PROPOFOL 10 MG/ML IV BOLUS
INTRAVENOUS | Status: AC
  Filled 2021-11-25: qty 20

## 2021-11-25 MED ORDER — LABETALOL HCL 5 MG/ML IV SOLN
INTRAVENOUS | Status: AC
  Filled 2021-11-25: qty 4

## 2021-11-25 MED ORDER — HYDROCODONE-ACETAMINOPHEN 5-325 MG PO TABS
1.0000 | ORAL_TABLET | Freq: Four times a day (QID) | ORAL | Status: DC | PRN
  Administered 2021-11-27: 2 via ORAL
  Administered 2021-11-27 – 2021-11-28 (×3): 1 via ORAL
  Filled 2021-11-25: qty 1
  Filled 2021-11-25: qty 2
  Filled 2021-11-25 (×2): qty 1

## 2021-11-25 MED ORDER — PROCHLORPERAZINE EDISYLATE 10 MG/2ML IJ SOLN
10.0000 mg | Freq: Four times a day (QID) | INTRAMUSCULAR | Status: DC | PRN
  Administered 2021-11-25: 10 mg via INTRAVENOUS
  Filled 2021-11-25: qty 2

## 2021-11-25 MED ORDER — PROPOFOL 10 MG/ML IV BOLUS
INTRAVENOUS | Status: DC | PRN
  Administered 2021-11-25: 200 mg via INTRAVENOUS

## 2021-11-25 MED ORDER — ACETAMINOPHEN 500 MG PO TABS
ORAL_TABLET | ORAL | Status: AC
  Administered 2021-11-25: 1000 mg via ORAL
  Filled 2021-11-25: qty 2

## 2021-11-25 MED ORDER — HYDROMORPHONE HCL 1 MG/ML IJ SOLN
INTRAMUSCULAR | Status: AC
  Filled 2021-11-25: qty 1

## 2021-11-25 MED ORDER — ONDANSETRON HCL 4 MG/2ML IJ SOLN
INTRAMUSCULAR | Status: DC | PRN
  Administered 2021-11-25: 4 mg via INTRAVENOUS

## 2021-11-25 MED ORDER — CHLORHEXIDINE GLUCONATE 0.12 % MT SOLN: 15.0000 mL | Freq: Once | OROMUCOSAL | Status: AC

## 2021-11-25 MED ORDER — MIDAZOLAM HCL 2 MG/2ML IJ SOLN
INTRAMUSCULAR | Status: AC
  Filled 2021-11-25: qty 2

## 2021-11-25 MED ORDER — SUGAMMADEX SODIUM 200 MG/2ML IV SOLN
INTRAVENOUS | Status: DC | PRN
  Administered 2021-11-25: 200 mg via INTRAVENOUS

## 2021-11-25 MED ORDER — ACETAMINOPHEN 500 MG PO TABS
500.0000 mg | ORAL_TABLET | Freq: Two times a day (BID) | ORAL | Status: DC
  Administered 2021-11-25 – 2021-11-28 (×6): 500 mg via ORAL
  Filled 2021-11-25 (×6): qty 1

## 2021-11-25 MED ORDER — METOPROLOL TARTRATE 12.5 MG HALF TABLET: 12.5000 mg | ORAL_TABLET | Freq: Once | ORAL | Status: AC

## 2021-11-25 MED ORDER — PHENYLEPHRINE 80 MCG/ML (10ML) SYRINGE FOR IV PUSH (FOR BLOOD PRESSURE SUPPORT)
PREFILLED_SYRINGE | INTRAVENOUS | Status: DC | PRN
  Administered 2021-11-25: 80 ug via INTRAVENOUS
  Administered 2021-11-25: 160 ug via INTRAVENOUS

## 2021-11-25 MED ORDER — LIDOCAINE 2% (20 MG/ML) 5 ML SYRINGE
INTRAMUSCULAR | Status: AC
  Filled 2021-11-25: qty 5

## 2021-11-25 MED ORDER — LACTATED RINGERS IV SOLN: INTRAVENOUS | Status: DC

## 2021-11-25 MED ORDER — ONDANSETRON HCL 4 MG/2ML IJ SOLN
4.0000 mg | Freq: Four times a day (QID) | INTRAMUSCULAR | Status: DC | PRN
Start: 2021-11-25 — End: 2021-11-28
  Administered 2021-11-25: 4 mg via INTRAVENOUS
  Filled 2021-11-25: qty 2

## 2021-11-25 MED ORDER — LABETALOL HCL 5 MG/ML IV SOLN
5.0000 mg | INTRAVENOUS | Status: DC | PRN
  Administered 2021-11-25 (×2): 5 mg via INTRAVENOUS

## 2021-11-25 MED ORDER — METOPROLOL TARTRATE 12.5 MG HALF TABLET
ORAL_TABLET | ORAL | Status: AC
  Administered 2021-11-25: 12.5 mg via ORAL
  Filled 2021-11-25: qty 1

## 2021-11-25 MED ORDER — CHLORHEXIDINE GLUCONATE 0.12 % MT SOLN
OROMUCOSAL | Status: AC
  Administered 2021-11-25: 15 mL via OROMUCOSAL
  Filled 2021-11-25: qty 15

## 2021-11-25 MED ORDER — HYDROMORPHONE HCL 1 MG/ML IJ SOLN
0.2500 mg | INTRAMUSCULAR | Status: DC | PRN
  Administered 2021-11-25: 0.25 mg via INTRAVENOUS

## 2021-11-25 MED ORDER — DEXAMETHASONE SODIUM PHOSPHATE 10 MG/ML IJ SOLN
INTRAMUSCULAR | Status: DC | PRN
Start: 2021-11-25 — End: 2021-11-25
  Administered 2021-11-25: 10 mg via INTRAVENOUS

## 2021-11-25 MED ORDER — ACETAMINOPHEN 500 MG PO TABS: 1000.0000 mg | ORAL_TABLET | Freq: Once | ORAL | Status: AC

## 2021-11-25 SURGICAL SUPPLY — 60 items
BAG COUNTER SPONGE SURGICOUNT (BAG) ×2 IMPLANT
BIT DRILL 3.8X6 NS (BIT) ×1 IMPLANT
BIT DRILL 4.4 NS (BIT) ×1 IMPLANT
BLADE SURG 10 STRL SS (BLADE) ×2 IMPLANT
BNDG ELASTIC 4X5.8 VLCR STR LF (GAUZE/BANDAGES/DRESSINGS) ×2 IMPLANT
BNDG ELASTIC 6X5.8 VLCR STR LF (GAUZE/BANDAGES/DRESSINGS) ×2 IMPLANT
BRUSH SCRUB EZ PLAIN DRY (MISCELLANEOUS) ×4 IMPLANT
COVER SURGICAL LIGHT HANDLE (MISCELLANEOUS) ×4 IMPLANT
DRAPE C-ARM 42X72 X-RAY (DRAPES) ×2 IMPLANT
DRAPE C-ARMOR (DRAPES) ×2 IMPLANT
DRAPE HALF SHEET 40X57 (DRAPES) IMPLANT
DRAPE INCISE IOBAN 66X45 STRL (DRAPES) IMPLANT
DRAPE U-SHAPE 47X51 STRL (DRAPES) ×2 IMPLANT
DRSG ADAPTIC 3X8 NADH LF (GAUZE/BANDAGES/DRESSINGS) ×2 IMPLANT
DRSG MEPITEL 3X4 ME34 (GAUZE/BANDAGES/DRESSINGS) ×1 IMPLANT
DRSG MEPITEL 4X7.2 (GAUZE/BANDAGES/DRESSINGS) ×2 IMPLANT
DRSG PAD ABDOMINAL 8X10 ST (GAUZE/BANDAGES/DRESSINGS) ×4 IMPLANT
ELECT REM PT RETURN 9FT ADLT (ELECTROSURGICAL) ×2
ELECTRODE REM PT RTRN 9FT ADLT (ELECTROSURGICAL) ×1 IMPLANT
GAUZE PAD ABD 8X10 STRL (GAUZE/BANDAGES/DRESSINGS) ×2 IMPLANT
GAUZE SPONGE 4X4 12PLY STRL (GAUZE/BANDAGES/DRESSINGS) ×2 IMPLANT
GLOVE BIO SURGEON STRL SZ7.5 (GLOVE) ×2 IMPLANT
GLOVE BIO SURGEON STRL SZ8 (GLOVE) ×2 IMPLANT
GLOVE BIO SURGEON STRL SZ8.5 (GLOVE) ×2 IMPLANT
GLOVE BIOGEL PI IND STRL 7.5 (GLOVE) ×1 IMPLANT
GLOVE BIOGEL PI IND STRL 8 (GLOVE) ×1 IMPLANT
GLOVE BIOGEL PI INDICATOR 7.5 (GLOVE) ×1
GLOVE BIOGEL PI INDICATOR 8 (GLOVE) ×1
GLOVE SURG ORTHO LTX SZ7.5 (GLOVE) ×4 IMPLANT
GOWN STRL REIN XL LVL4 (GOWNS) ×2 IMPLANT
GOWN STRL REUS W/ TWL LRG LVL3 (GOWN DISPOSABLE) ×2 IMPLANT
GOWN STRL REUS W/ TWL XL LVL3 (GOWN DISPOSABLE) ×1 IMPLANT
GOWN STRL REUS W/TWL LRG LVL3 (GOWN DISPOSABLE) ×4
GOWN STRL REUS W/TWL XL LVL3 (GOWN DISPOSABLE) ×1
GUIDEWIRE BALL NOSE 80CM (WIRE) ×1 IMPLANT
KIT BASIN OR (CUSTOM PROCEDURE TRAY) ×2 IMPLANT
KIT TURNOVER KIT B (KITS) ×2 IMPLANT
NAIL TIBIAL 9MMX36CM (Nail) ×1 IMPLANT
PACK ORTHO EXTREMITY (CUSTOM PROCEDURE TRAY) ×2 IMPLANT
PAD ARMBOARD 7.5X6 YLW CONV (MISCELLANEOUS) ×4 IMPLANT
PAD CAST 4YDX4 CTTN HI CHSV (CAST SUPPLIES) ×1 IMPLANT
PADDING CAST COTTON 4X4 STRL (CAST SUPPLIES) ×1
PADDING CAST COTTON 6X4 STRL (CAST SUPPLIES) ×2 IMPLANT
PIN GUIDE ACE (PIN) ×1 IMPLANT
SCREW ACECAP 36MM (Screw) ×1 IMPLANT
SCREW ACECAP 40MM (Screw) ×1 IMPLANT
SCREW ACECAP 44MM (Screw) ×1 IMPLANT
SCREW ACECAP 48MM (Screw) ×1 IMPLANT
SCREW PROXIMAL DEPUY (Screw) ×4 IMPLANT
SCREW PRXML FT 45X5.5XLCK NS (Screw) IMPLANT
SCREW PRXML FT 65X5.5XNS CORT (Screw) IMPLANT
STAPLER VISISTAT 35W (STAPLE) ×2 IMPLANT
SUT ETHILON 2 0 FS 18 (SUTURE) ×4 IMPLANT
SUT VIC AB 0 CT1 27 (SUTURE)
SUT VIC AB 0 CT1 27XBRD ANBCTR (SUTURE) IMPLANT
SUT VIC AB 2-0 CT1 27 (SUTURE) ×1
SUT VIC AB 2-0 CT1 TAPERPNT 27 (SUTURE) ×1 IMPLANT
TOWEL GREEN STERILE (TOWEL DISPOSABLE) ×4 IMPLANT
TOWEL GREEN STERILE FF (TOWEL DISPOSABLE) ×2 IMPLANT
YANKAUER SUCT BULB TIP NO VENT (SUCTIONS) IMPLANT

## 2021-11-25 NOTE — TOC CAGE-AID Note (Signed)
Transition of Care (TOC) - CAGE-AID Screening ? ? ?Patient Details  ?Name: Julian Gray ?MRN: [id number] ?Date of Birth: 1985/11/30 ? ?Transition of Care (TOC) CM/SW Contact:    ?Aldon Hengst C Tarpley-Carter, LCSWA ?Phone Number: ?11/25/2021, 11:35 AM ? ? ?Clinical Narrative: ?Pt is unable to participate in Cage Aid. ?Pt is not appropriate candidate for assessment.  Pt is experiencing Autism, intellectual disability, and perinatal anoxic ischemic brain injury. ? ?Insurance underwriter, MSW, LCSW-A ?Pronouns:  She/Her/Hers ?[facility] ?Clinical Social Worker ?Direct Number:  [phone] ?[email or web address] ? ?CAGE-AID Screening: ?Substance Abuse Screening unable to be completed due to: : Patient unable to participate ? ?  ?  ?  ?  ?  ? ?  ? ?  ? ? ? ? ? ? ?

## 2021-11-25 NOTE — ED Notes (Addendum)
RN heard pt cough from station, went to room and pt was vomiting large amount of dark emesis. Pt was supported until vomiting stopped, pt was then cleaned, changed, and placed back into a position of comfort. MD paged to be notified and to request nausea medication.  ?

## 2021-11-25 NOTE — Transfer of Care (Signed)
Immediate Anesthesia Transfer of Care Note ? ?Patient: Julian Gray ? ?Procedure(s) Performed: INTRAMEDULLARY (IM) NAIL TIBIAL (Right) ? ?Patient Location: PACU ? ?Anesthesia Type:General ? ?Level of Consciousness: drowsy ? ?Airway & Oxygen Therapy: Patient Spontanous Breathing and Patient connected to face mask oxygen ? ?Post-op Assessment: Report given to RN and Post -op Vital signs reviewed and stable ? ?Post vital signs: Reviewed and stable ? ?Last Vitals:  ?Vitals Value Taken Time  ?BP 153/101 11/25/21 1451  ?Temp 37.2 ?C 11/25/21 1450  ?Pulse 91 11/25/21 1451  ?Resp 12 11/25/21 1451  ?SpO2 96 % 11/25/21 1451  ?Vitals shown include unvalidated device data. ? ?Last Pain:  ?Vitals:  ? 11/25/21 1217  ?TempSrc: Axillary  ?PainSc:   ?   ? ?  ? ?Complications: No notable events documented. ?

## 2021-11-25 NOTE — Progress Notes (Signed)
?PROGRESS NOTE ? ?Julian Gray [id number] DOB: 13-Jan-1986 DOA: 11/24/2021 ?PCP: Haywood Pao, MD ? ? LOS: 1 day  ? ?Brief Narrative / Interim history: ?36 year old male with perinatal anoxic ischemic brain injury, intellectual disability, autism, seizure disorder, remote history of esophageal perforation with pneumomediastinum who comes into the hospital after a fall at home.  He is minimally verbal, but apparently he was walking to the bathroom when he fell on the floor.  He was complaining of right leg pain and he was brought to the hospital.  Of note, he has a history of recurrent falls, and was hospitalized in February 2023 with left lower extremity fracture status post IM nailing.  Orthopedic surgery was consulted ? ?Subjective / 24h Interval events: ?Appears comfortable.  Alert, nonverbal ? ?Assesement and Plan: ?Principal Problem: ?  Closed fracture of right lower extremity ?Active Problems: ?  Perinatal anoxic-ischemic brain injury ?  Intellectual disability ?  Autism spectrum ?  Seizure disorder ([place]) ? ?Principal problem ?Closed fracture of the right lower extremity-patient comes into the hospital after a fall, imaging showed right distal tibial diaphysis fracture and right proximal fibular diaphysis fracture.  Orthopedic surgery consulted, plans for operative repair later on today.  Continue to monitor, pain control. ? ?Active problems ?Seizure disorder-continue home medications ? ?Perinatal anoxic ischemic brain injury, autism spectrum disorder-monitor, continue home medications ? ?Nausea, vomiting-intermittent, possibly due to pain/pain medications.  Antiemetics, supportive treatment.  Ensure good bowel regimen once he comes out of the OR ? ?Scheduled Meds: ? chlorhexidine  60 mL Topical Once  ? clozapine  50 mg Oral TID  ? oxcarbazepine  600 mg Oral BID  ? povidone-iodine  2 application. Topical Once  ? sodium chloride flush  3 mL Intravenous Q12H  ? ?Continuous Infusions: ?  ceFAZolin  (ANCEF) IV Stopped (11/25/21 0517)  ? ?PRN Meds:.acetaminophen **OR** acetaminophen, HYDROcodone-acetaminophen, HYDROmorphone (DILAUDID) injection, hydrOXYzine, ondansetron (ZOFRAN) IV, polyethylene glycol, prochlorperazine ? ?Diet Orders (From admission, onward)  ? ?  Start     Ordered  ? 11/25/21 0001  Diet NPO time specified  Diet effective midnight       ? 11/24/21 1948  ? ?  ?  ? ?  ? ? ?DVT prophylaxis:  ? ? ?Lab Results  ?Component Value Date  ? PLT 249 11/25/2021  ? ? ?  Code Status: Full Code ? ?Family Communication: no family at bedside  ? ?Status is: Inpatient ? ?Remains inpatient appropriate because: Pending surgery ? ?Level of care: Med-Surg ? ?Consultants:  ?Orthopedic surgery ? ?Procedures:  ?none ? ?Microbiology  ?none ? ?Antimicrobials: ?none  ? ? ?Objective: ?Vitals:  ? 11/24/21 1445 11/25/21 0302 11/25/21 0646 11/25/21 0800  ?BP:  (!) 159/97 (!) 142/80 (!) 130/99  ?Pulse:  69 88 (!) 120  ?Resp:  16 20 18   ?Temp: (!) 97.4 ?F (36.3 ?C)   98.3 ?F (36.8 ?C)  ?TempSrc: Oral   Oral  ?SpO2:  100% 100% 94%  ?Weight:    102.1 kg  ?Height:    6\' 1"  (1.854 m)  ? ?No intake or output data in the 24 hours ending 11/25/21 1002 ?Wt Readings from Last 3 Encounters:  ?11/25/21 102.1 kg  ?09/04/21 104.3 kg  ?09/29/19 97.5 kg  ? ? ?Examination: ? ?Constitutional: NAD ?Eyes: no scleral icterus ?ENMT: Mucous membranes are moist.  ?Neck: normal, supple ?Respiratory: clear to auscultation bilaterally, no wheezing, no crackles. Normal respiratory effort.  ?Cardiovascular: Regular rate and rhythm, no murmurs / rubs / gallops.  ?  Abdomen: non distended, no tenderness. Bowel sounds positive.  ?Musculoskeletal: no clubbing / cyanosis.  ?Skin: no rashes ?Neurologic: No apparent focal deficits ? ? ?Data Reviewed: I have independently reviewed following labs and imaging studies ? ?CBC ?Recent Labs  ?Lab 11/24/21 ?1702 11/25/21 ?0321  ?WBC 10.7* 9.7  ?HGB 15.0 15.8  ?HCT 46.0 47.4  ?PLT 211 249  ?MCV 88.0 87.0  ?MCH 28.7 29.0   ?MCHC 32.6 33.3  ?RDW 12.5 12.8  ?LYMPHSABS 1.1  --   ?MONOABS 0.7  --   ?EOSABS 0.1  --   ?BASOSABS 0.0  --   ? ? ?Recent Labs  ?Lab 11/24/21 ?1702 11/25/21 ?0321  ?NA 138 135  ?K 4.2 4.2  ?CL 101 100  ?CO2 27 20*  ?GLUCOSE 105* 128*  ?BUN 12 16  ?CREATININE 0.91 1.04  ?CALCIUM 9.7 9.7  ?AST  --  28  ?ALT  --  25  ?ALKPHOS  --  162*  ?BILITOT  --  0.9  ?ALBUMIN  --  4.3  ?MG  --  2.1  ? ? ?------------------------------------------------------------------------------------------------------------------ ?No results for input(s): CHOL, HDL, LDLCALC, TRIG, CHOLHDL, LDLDIRECT in the last 72 hours. ? ?No results found for: HGBA1C ?------------------------------------------------------------------------------------------------------------------ ?No results for input(s): TSH, T4TOTAL, T3FREE, THYROIDAB in the last 72 hours. ? ?Invalid input(s): FREET3 ? ?Cardiac Enzymes ?No results for input(s): CKMB, TROPONINI, MYOGLOBIN in the last 168 hours. ? ?Invalid input(s): CK ?------------------------------------------------------------------------------------------------------------------ ?No results found for: BNP ? ?CBG: ?No results for input(s): GLUCAP in the last 168 hours. ? ?No results found for this or any previous visit (from the past 240 hour(s)).  ? ?Radiology Studies: ?DG Tibia/Fibula Right ? ?Result Date: 11/24/2021 ?CLINICAL DATA:  Fall. EXAM: RIGHT TIBIA AND FIBULA - 2 VIEW COMPARISON:  None. FINDINGS: There is an acute oblique fracture through the distal tibial diaphysis. There is 6 mm of lateral distraction of the distal fracture fragment. No significant angulation. There is an acute oblique fracture through the proximal fibular diaphysis. There is 1 shaft with lateral displacement of the distal fracture fragment with mild apex lateral angulation. There is no evidence for dislocation. Joint spaces are maintained. There is soft tissue swelling surrounding the fractures. IMPRESSION: 1. Acute fracture of the distal  tibial diaphysis. 2. Acute fracture of the proximal fibular diaphysis. Electronically Signed   By: Ronney Asters M.D.   On: 11/24/2021 15:37  ? ?DG Ankle Complete Right ? ?Result Date: 11/24/2021 ?CLINICAL DATA:  Tripped and fell when going to the bathroom. EXAM: RIGHT ANKLE - COMPLETE 3+ VIEW COMPARISON:  None. FINDINGS: There is an acute, comminuted obliquely oriented fracture deformity involving the distal diaphysis of the tibia. There is posterior and lateral displacement of the distal fracture fragments by approximately 7 mm. IMPRESSION: Acute, comminuted obliquely oriented fracture deformity involves the distal diaphysis of the tibia. Electronically Signed   By: Kerby Moors M.D.   On: 11/24/2021 15:40   ? ? ?Marzetta Board, MD, PhD ?[facility] ? ?Between 7 am - 7 pm I am available, please contact me via Amion (for emergencies) or Securechat (non urgent messages) ? ?Between 7 pm - 7 am I am not available, please contact night coverage MD/APP via Amion ? ?

## 2021-11-25 NOTE — Progress Notes (Addendum)
Orthopedic Tech Progress Note ?Patient Details:  ?ASAEL PANN ?12/15/1985 ?[id number] ?Dropped off CAM WALKER BOOT to room, family at bedside ? ? ?Ortho Devices ?Type of Ortho Device: CAM walker ?Ortho Device/Splint Location: RLE ?Ortho Device/Splint Interventions: Ordered ?  ?Post Interventions ?Patient Tolerated: Well ?Instructions Provided: Care of device ? ?Donald Pore ?11/25/2021, 5:30 PM ? ?

## 2021-11-25 NOTE — Plan of Care (Signed)

## 2021-11-25 NOTE — Anesthesia Postprocedure Evaluation (Signed)
Anesthesia Post Note ? ?Patient: Julian Gray ? ?Procedure(s) Performed: INTRAMEDULLARY (IM) NAIL TIBIAL (Right) ? ?  ? ?Patient location during evaluation: PACU ?Anesthesia Type: General ?Level of consciousness: awake and alert ?Pain management: pain level controlled ?Vital Signs Assessment: post-procedure vital signs reviewed and stable ?Respiratory status: spontaneous breathing, nonlabored ventilation and respiratory function stable ?Cardiovascular status: blood pressure returned to baseline and stable ?Postop Assessment: no apparent nausea or vomiting ?Anesthetic complications: no ? ? ?No notable events documented. ? ?Last Vitals:  ?Vitals:  ? 11/25/21 1544 11/25/21 1549  ?BP: (!) 158/102 (!) 133/96  ?Pulse: 84 78  ?Resp: 17   ?Temp: (!) 36.4 ?C   ?SpO2: 97%   ?  ?Last Pain:  ?Vitals:  ? 11/25/21 1544  ?TempSrc: Oral  ?PainSc:   ? ? ?  ?  ?  ?  ?  ?  ? ?Damiyah Ditmars,W. EDMOND ? ? ? ? ?

## 2021-11-25 NOTE — Anesthesia Procedure Notes (Addendum)
Procedure Name: Intubation ?Date/Time: 11/25/2021 12:54 PM ?Performed by: Erick Colace, CRNA ?Pre-anesthesia Checklist: Patient identified, Emergency Drugs available, Suction available and Patient being monitored ?Patient Re-evaluated:Patient Re-evaluated prior to induction ?Oxygen Delivery Method: Circle system utilized ?Preoxygenation: Pre-oxygenation with 100% oxygen ?Induction Type: IV induction ?Ventilation: Mask ventilation without difficulty ?Laryngoscope Size: Glidescope and 4 ?Grade View: Grade I ?Tube type: Oral ?Tube size: 7.5 mm ?Number of attempts: 1 ?Airway Equipment and Method: Stylet and Oral airway ?Placement Confirmation: ETT inserted through vocal cords under direct vision, positive ETCO2 and breath sounds checked- equal and bilateral ?Secured at: 23 cm ?Tube secured with: Tape ?Dental Injury: Teeth and Oropharynx as per pre-operative assessment  ?Comments: Elective glidescope due to previous history of difficult intubation. ? ? ? ? ?

## 2021-11-25 NOTE — Anesthesia Preprocedure Evaluation (Addendum)
Anesthesia Evaluation  ?Patient identified by MRN, date of birth, ID band ?Patient awake ? ? ? ?Reviewed: ?Allergy & Precautions, H&P , NPO status , Patient's Chart, lab work & pertinent test results ? ?Airway ?Mallampati: III ? ?TM Distance: >3 FB ?Neck ROM: Full ? ? ? Dental ?no notable dental hx. ?(+) Teeth Intact, Dental Advisory Given ?  ?Pulmonary ?neg pulmonary ROS,  ?  ?Pulmonary exam normal ?breath sounds clear to auscultation ? ? ? ? ? ? Cardiovascular ?negative cardio ROS ? ? ?Rhythm:Regular Rate:Normal ? ? ?  ?Neuro/Psych ?Seizures -, Well Controlled,  negative neurological ROS ? negative psych ROS  ? GI/Hepatic ?negative GI ROS, Neg liver ROS,   ?Endo/Other  ?negative endocrine ROS ? Renal/GU ?negative Renal ROS  ?negative genitourinary ?  ?Musculoskeletal ? ? Abdominal ?  ?Peds ? Hematology ?negative hematology ROS ?(+) Blood dyscrasia, anemia ,   ?Anesthesia Other Findings ? ? Reproductive/Obstetrics ?negative OB ROS ? ?  ? ? ? ? ? ? ? ? ? ? ? ? ? ?  ?  ? ? ? ? ? ? ? ?Anesthesia Physical ?Anesthesia Plan ? ?ASA: 2 ? ?Anesthesia Plan: General  ? ?Post-op Pain Management: Ofirmev IV (intra-op)*  ? ?Induction: Intravenous ? ?PONV Risk Score and Plan: 3 and Ondansetron, Dexamethasone and Midazolam ? ?Airway Management Planned: Oral ETT ? ?Additional Equipment:  ? ?Intra-op Plan:  ? ?Post-operative Plan: Extubation in OR ? ?Informed Consent: I have reviewed the patients History and Physical, chart, labs and discussed the procedure including the risks, benefits and alternatives for the proposed anesthesia with the patient or authorized representative who has indicated his/her understanding and acceptance.  ? ? ? ?Dental advisory given and Consent reviewed with POA ? ?Plan Discussed with: CRNA ? ?Anesthesia Plan Comments:   ? ? ? ? ? ?Anesthesia Quick Evaluation ? ?

## 2021-11-25 NOTE — Progress Notes (Signed)
[facility] night cross cover note: ? ?I was notified by RN with request for antiemetic in the setting of the patient's nausea resulting in episode of nonbloody, nonbilious emesis x1.  I subsequently placed order for prn IV Zofran. ? ? ? ? ?Babs Bertin, DO ?Hospitalist ? ?

## 2021-11-25 NOTE — ED Notes (Signed)
Pt had second large emesis event, approx 900cc of vomit, nonbloody. Pt cleaned, changed, MD Gherghe made aware. New orders received.  ?

## 2021-11-26 ENCOUNTER — Encounter (HOSPITAL_COMMUNITY): Payer: Self-pay | Admitting: Internal Medicine

## 2021-11-26 DIAGNOSIS — S82301A Unspecified fracture of lower end of right tibia, initial encounter for closed fracture: Secondary | ICD-10-CM | POA: Diagnosis not present

## 2021-11-26 DIAGNOSIS — M84461A Pathological fracture, right tibia, initial encounter for fracture: Secondary | ICD-10-CM

## 2021-11-26 DIAGNOSIS — F840 Autistic disorder: Secondary | ICD-10-CM | POA: Diagnosis not present

## 2021-11-26 DIAGNOSIS — W19XXXA Unspecified fall, initial encounter: Secondary | ICD-10-CM | POA: Diagnosis not present

## 2021-11-26 DIAGNOSIS — Z87310 Personal history of (healed) osteoporosis fracture: Secondary | ICD-10-CM | POA: Diagnosis present

## 2021-11-26 HISTORY — DX: Pathological fracture, right tibia, initial encounter for fracture: M84.461A

## 2021-11-26 HISTORY — DX: Personal history of (healed) osteoporosis fracture: Z87.310

## 2021-11-26 LAB — COMPREHENSIVE METABOLIC PANEL
ALT: 20 U/L (ref 0–44)
AST: 20 U/L (ref 15–41)
Albumin: 4 g/dL (ref 3.5–5.0)
Alkaline Phosphatase: 144 U/L — ABNORMAL HIGH (ref 38–126)
Anion gap: 11 (ref 5–15)
BUN: 22 mg/dL — ABNORMAL HIGH (ref 6–20)
CO2: 28 mmol/L (ref 22–32)
Calcium: 9.4 mg/dL (ref 8.9–10.3)
Chloride: 97 mmol/L — ABNORMAL LOW (ref 98–111)
Creatinine, Ser: 1.17 mg/dL (ref 0.61–1.24)
GFR, Estimated: >60 mL/min (ref >60)
Glucose, Bld: 151 mg/dL — ABNORMAL HIGH (ref 70–99)
Potassium: 4.3 mmol/L (ref 3.5–5.1)
Sodium: 136 mmol/L (ref 135–145)
Total Bilirubin: 0.8 mg/dL (ref 0.3–1.2)
Total Protein: 7.2 g/dL (ref 6.5–8.1)

## 2021-11-26 LAB — CBC
HCT: 44.8 % (ref 39.0–52.0)
Hemoglobin: 15.1 g/dL (ref 13.0–17.0)
MCH: 29 pg (ref 26.0–34.0)
MCHC: 33.7 g/dL (ref 30.0–36.0)
MCV: 86.2 fL (ref 80.0–100.0)
Platelets: 242 10*3/uL (ref 150–400)
RBC: 5.2 MIL/uL (ref 4.22–5.81)
RDW: 12.8 % (ref 11.5–15.5)
WBC: 12.2 10*3/uL — ABNORMAL HIGH (ref 4.0–10.5)
nRBC: 0 % (ref 0.0–0.2)

## 2021-11-26 LAB — TESTOSTERONE: Testosterone: 82 ng/dL — ABNORMAL LOW (ref 264–916)

## 2021-11-26 LAB — SEX HORMONE BINDING GLOBULIN: Sex Hormone Binding: 57.7 nmol/L — ABNORMAL HIGH (ref 16.5–55.9)

## 2021-11-26 LAB — PTH, INTACT AND CALCIUM
Calcium, Total (PTH): 9.9 mg/dL (ref 8.7–10.2)
PTH: 13 pg/mL — ABNORMAL LOW (ref 15–65)

## 2021-11-26 LAB — TESTOSTERONE, FREE: Testosterone, Free: 24.6 pg/mL (ref 8.7–25.1)

## 2021-11-26 MED ORDER — ASPIRIN 325 MG PO TABS: 325.0000 mg | ORAL_TABLET | Freq: Every day | ORAL | Status: DC

## 2021-11-26 MED ORDER — ENOXAPARIN SODIUM 40 MG/0.4ML IJ SOSY
40.0000 mg | PREFILLED_SYRINGE | INTRAMUSCULAR | Status: DC
  Administered 2021-11-26 – 2021-11-27 (×2): 40 mg via SUBCUTANEOUS
  Filled 2021-11-26 (×2): qty 0.4

## 2021-11-26 NOTE — Progress Notes (Signed)
?PROGRESS NOTE ? ?Julian Gray JXB:[id number] DOB: [birth year] DOA: 11/24/2021 ?PCP: Gaspar Garbeisovec, Richard W, MD ? ? LOS: 2 days  ? ?Brief Narrative / Interim history: ?36 year old male with perinatal anoxic ischemic brain injury, intellectual disability, autism, seizure disorder, remote history of esophageal perforation with pneumomediastinum who comes into the hospital after a fall at home.  He is minimally verbal, but apparently he was walking to the bathroom when he fell on the floor.  He was complaining of right leg pain and he was brought to the hospital.  Of note, he has a history of recurrent falls, and was hospitalized in February 2023 with left lower extremity fracture status post IM nailing.  Orthopedic surgery was consulted ? ?Subjective / 24h Interval events: ?He appears comfortable, got some pain medications earlier on. ? ?Assesement and Plan: ?Principal Problem: ?  Closed fracture of right lower extremity ?Active Problems: ?  Perinatal anoxic-ischemic brain injury ?  Intellectual disability ?  Autism spectrum ?  Seizure disorder (HCC) ? ?Principal problem ?Closed fracture of the right lower extremity-patient comes into the hospital after a fall, imaging showed right distal tibial diaphysis fracture and right proximal fibular diaphysis fracture.  Orthopedic surgery consulted, he is status post IM nail fixation on the right, 4/25.  Operative report pending ? ?Active problems ?Seizure disorder-continue home medications ? ?Perinatal anoxic ischemic brain injury, autism spectrum disorder-monitor, continue home medications, he appears at baseline ? ?Nausea, vomiting-shortly after admission, intermittent, possibly due to pain/pain medications.  Antiemetics, supportive treatment.  No further nausea or vomiting today ? ?Scheduled Meds: ? acetaminophen  500 mg Oral Q12H  ? clozapine  50 mg Oral TID  ? oxcarbazepine  600 mg Oral BID  ? pneumococcal 20-valent conjugate vaccine  0.5 mL Intramuscular Tomorrow-1000  ?  sodium chloride flush  3 mL Intravenous Q12H  ? ?Continuous Infusions: ?  ceFAZolin (ANCEF) IV 2 g (11/26/21 0418)  ? ?PRN Meds:.acetaminophen **OR** acetaminophen, HYDROcodone-acetaminophen, HYDROmorphone (DILAUDID) injection, hydrOXYzine, ondansetron (ZOFRAN) IV, polyethylene glycol, prochlorperazine ? ?Diet Orders (From admission, onward)  ? ?  Start     Ordered  ? 11/25/21 1543  Diet regular Room service appropriate? Yes; Fluid consistency: Thin  Diet effective now       ?Question Answer Comment  ?Room service appropriate? Yes   ?Fluid consistency: Thin   ?  ? 11/25/21 1542  ? ?  ?  ? ?  ? ? ?DVT prophylaxis:  ? ? ?Lab Results  ?Component Value Date  ? PLT 242 11/26/2021  ? ? ?  Code Status: Full Code ? ?Family Communication: no family at bedside  ? ?Status is: Inpatient ? ?Remains inpatient appropriate because: Pending surgery ? ?Level of care: Med-Surg ? ?Consultants:  ?Orthopedic surgery ? ?Procedures:  ?none ? ?Microbiology  ?none ? ?Antimicrobials: ?none  ? ? ?Objective: ?Vitals:  ? 11/25/21 1549 11/25/21 1942 11/26/21 0421 11/26/21 0758  ?BP: (!) 133/96 (!) 140/95 110/86 (!) 157/105  ?Pulse: 78 80 78 (!) 109  ?Resp:  19 18 16   ?Temp:  98.6 ?F (37 ?C) 98.1 ?F (36.7 ?C) 99.3 ?F (37.4 ?C)  ?TempSrc:  Oral Oral Oral  ?SpO2:  98% 97% 92%  ?Weight:      ?Height:      ? ? ?Intake/Output Summary (Last 24 hours) at 11/26/2021 1125 ?Last data filed at 11/26/2021 0418 ?Gross per 24 hour  ?Intake 1100 ml  ?Output 650 ml  ?Net 450 ml  ? ?Wt Readings from Last 3 Encounters:  ?11/25/21 102.1  kg  ?09/04/21 104.3 kg  ?09/29/19 97.5 kg  ? ? ?Examination: ? ?Constitutional: NAD ?Eyes: lids and conjunctivae normal, no scleral icterus ?ENMT: mmm ?Neck: normal, supple ?Respiratory: clear to auscultation bilaterally, no wheezing, no crackles.  ?Cardiovascular: Regular rate and rhythm, no murmurs / rubs / gallops.  ?Abdomen: soft, no distention, no tenderness. Bowel sounds positive.  ?Skin: no rashes ? ? ?Data Reviewed: I have  independently reviewed following labs and imaging studies ? ?CBC ?Recent Labs  ?Lab 11/24/21 ?1702 11/25/21 ?0321 11/26/21 ?0209  ?WBC 10.7* 9.7 12.2*  ?HGB 15.0 15.8 15.1  ?HCT 46.0 47.4 44.8  ?PLT 211 249 242  ?MCV 88.0 87.0 86.2  ?MCH 28.7 29.0 29.0  ?MCHC 32.6 33.3 33.7  ?RDW 12.5 12.8 12.8  ?LYMPHSABS 1.1  --   --   ?MONOABS 0.7  --   --   ?EOSABS 0.1  --   --   ?BASOSABS 0.0  --   --   ? ? ? ?Recent Labs  ?Lab 11/24/21 ?1702 11/25/21 ?0321 11/26/21 ?0209  ?NA 138 135 136  ?K 4.2 4.2 4.3  ?CL 101 100 97*  ?CO2 27 20* 28  ?GLUCOSE 105* 128* 151*  ?BUN 12 16 22*  ?CREATININE 0.91 1.04 1.17  ?CALCIUM 9.7 9.7  9.9 9.4  ?AST  --  28 20  ?ALT  --  25 20  ?ALKPHOS  --  162* 144*  ?BILITOT  --  0.9 0.8  ?ALBUMIN  --  4.3 4.0  ?MG  --  2.1  --   ? ? ? ?------------------------------------------------------------------------------------------------------------------ ?No results for input(s): CHOL, HDL, LDLCALC, TRIG, CHOLHDL, LDLDIRECT in the last 72 hours. ? ?No results found for: HGBA1C ?------------------------------------------------------------------------------------------------------------------ ?No results for input(s): TSH, T4TOTAL, T3FREE, THYROIDAB in the last 72 hours. ? ?Invalid input(s): FREET3 ? ?Cardiac Enzymes ?No results for input(s): CKMB, TROPONINI, MYOGLOBIN in the last 168 hours. ? ?Invalid input(s): CK ?------------------------------------------------------------------------------------------------------------------ ?No results found for: BNP ? ?CBG: ?No results for input(s): GLUCAP in the last 168 hours. ? ?No results found for this or any previous visit (from the past 240 hour(s)).  ? ?Radiology Studies: ?DG Tibia/Fibula Right ? ?Result Date: 11/25/2021 ?CLINICAL DATA:  Intramedullary rod fixation of right tibia. EXAM: RIGHT TIBIA AND FIBULA - 2 VIEW; DG C-ARM 1-60 MIN-NO REPORT Radiation exposure index: 1.59 mGy. COMPARISON:  November 24, 2021. FINDINGS: Ten intraoperative fluoroscopic images were  obtained of the right tibia. These images demonstrate intramedullary rod fixation of distal right tibial fracture. IMPRESSION: Fluoroscopic guidance provided during intramedullary rod fixation of distal right tibial fracture. Electronically Signed   By: Lupita Raider M.D.   On: 11/25/2021 14:41  ? ?DG Tibia/Fibula Right Port ? ?Result Date: 11/25/2021 ?CLINICAL DATA:  Postop ORIF right tibia and fibula. EXAM: PORTABLE RIGHT TIBIA AND FIBULA - 2 VIEW COMPARISON:  Right tibia and fibula radiographs 11/24/2021 FINDINGS: Interval intramedullary nail fixation of the previously seen distal tibial oblique fracture. Improved, near anatomic alignment. Redemonstration of oblique fracture of the proximal fibular diaphysis with slightly improved alignment but persistent mild 3 mm lateral step-off of the distal component with respect of the proximal component. IMPRESSION: Interval intramedullary nail fixation of distal tibial diaphyseal fracture with improved alignment. Electronically Signed   By: Neita Garnet M.D.   On: 11/25/2021 17:04  ? ?DG C-Arm 1-60 Min-No Report ? ?Result Date: 11/25/2021 ?Fluoroscopy was utilized by the requesting physician.  No radiographic interpretation.   ? ? ?Pamella Pert, MD, PhD ?[facility] ? ?Between  7 am - 7 pm I am available, please contact me via Amion (for emergencies) or Securechat (non urgent messages) ? ?Between 7 pm - 7 am I am not available, please contact night coverage MD/APP via Amion ? ?

## 2021-11-26 NOTE — Evaluation (Signed)
Physical Therapy Evaluation ?Patient Details ?Name: Julian Gray ?MRN: [id number] ?DOB: [birth year] ?Today's Date: 11/26/2021 ? ?History of Present Illness ? Pt is a 36 y.o. male admitted 11/24/21 after sustaining a fall with R tib/fib fxs. Underwent ORIF R tibia on 11/25/21. PMH includes perniatal anoxic brain injury, intellectual disability disorder, autism spectrum disorder, epilepsy. ?  ?Clinical Impression ? Pt presents with an overall decrease in functional mobility secondary to above. PTA, pt typically ambulatory without DME, h/o multiple falls, family assists with mobility/ADL/iADLs as needed; per pt's dad, pt's activity "just starting to get back to normal" since recent L tibial IMN 09/2021. Educ re: precautions, positioning, cam boot wear, activity recommendations. Today, pt able to perform standing and pivot transfers with modA+2, limited by difficulty accepting weight onto RLE. Pt's father present and supportive, reports pt will have necessary DME and assist upon return home. Pt would benefit from continued acute PT services to maximize functional mobility and independence prior to d/c with HHPT services.     ? ?Recommendations for follow up therapy are one component of a multi-disciplinary discharge planning process, led by the attending physician.  Recommendations may be updated based on patient status, additional functional criteria and insurance authorization. ? ?Follow Up Recommendations Home health PT ? ?  ?Assistance Recommended at Discharge Frequent or constant Supervision/Assistance  ?Patient can return home with the following ? A lot of help with walking and/or transfers;A lot of help with bathing/dressing/bathroom;Help with stairs or ramp for entrance ? ?  ?Equipment Recommendations None recommended by PT  ?Recommendations for Other Services ?    ?  ?Functional Status Assessment Patient has had a recent decline in their functional status and demonstrates the ability to make significant improvements  in function in a reasonable and predictable amount of time.  ? ?  ?Precautions / Restrictions Precautions ?Precautions: Fall ?Required Braces or Orthoses: Other Brace ?Other Brace: CAM boot ?Restrictions ?Weight Bearing Restrictions: Yes ?RLE Weight Bearing: Weight bearing as tolerated ?Other Position/Activity Restrictions: WBAT in CAM boot  ? ?  ? ?Mobility ? Bed Mobility ?Overal bed mobility: Needs Assistance ?Bed Mobility: Supine to Sit ?  ?  ?Supine to sit: Min assist, +2 for safety/equipment, HOB elevated ?  ?  ?General bed mobility comments: increased time, assist for R LE over EOB and to raise trunk, HOB up ?  ? ?Transfers ?Overall transfer level: Needs assistance ?Equipment used: 2 person hand held assist, Rolling walker (2 wheels) ?Transfers: Sit to/from Stand, Bed to chair/wheelchair/BSC ?Sit to Stand: Mod assist, +2 physical assistance ?Stand pivot transfers: Mod assist, +2 physical assistance ?  ?  ?  ?  ?General transfer comment: pt prefers to not use RW, able to stand 2x from EOB and stand pivot to recliner with modA+2 and bilateral HHA, pt with difficulty accepting weight onto RLE in order to take steps; additional 3x sit<>stand from recliner for pericare, pt able to stand with modA+2 on final trial with RW, still putting minimal weight through RLE (dad reports, "he doesn't trust it yet... it took him a while to trust the last one") ?  ? ?Ambulation/Gait ?  ?  ?  ?  ?  ?  ?  ?  ? ?Stairs ?  ?  ?  ?  ?  ? ?Wheelchair Mobility ?  ? ?Modified Rankin (Stroke Patients Only) ?  ? ?  ? ?Balance Overall balance assessment: Needs assistance ?  ?Sitting balance-Leahy Scale: Fair ?  ?  ?Standing balance support: Bilateral upper  extremity supported ?Standing balance-Leahy Scale: Poor ?  ?  ?  ?  ?  ?  ?  ?  ?  ?  ?  ?  ?   ? ? ? ?Pertinent Vitals/Pain Pain Assessment ?Pain Assessment: Faces ?Faces Pain Scale: Hurts a little bit ?Pain Location: R LE ?Pain Descriptors / Indicators: Guarding, Discomfort ?Pain  Intervention(s): Monitored during session, Limited activity within patient's tolerance  ? ? ?Home Living Family/patient expects to be discharged to:: Private residence ?Living Arrangements: Parent ?Available Help at Discharge: Family;Available 24 hours/day ?Type of Home: House ?Home Access: Stairs to enter ?Entrance Stairs-Rails: Right ?Entrance Stairs-Number of Steps: 2-3 ?Alternate Level Stairs-Number of Steps: flight ?Home Layout: Two level;1/2 bath on main level;Able to live on main level with bedroom/bathroom ?Home Equipment: Rolling Walker (2 wheels);BSC/3in1;Grab bars - toilet;Grab bars - tub/shower;Wheelchair - manual ?Additional Comments: Lives with parents; sister is licensed caregiver who assists as well.  ?  ?Prior Function Prior Level of Function : Needs assist ?  ?  ?  ?  ?  ?  ?Mobility Comments: unsteady gait with many falls, but does not use AD; enjoys dancing class ?ADLs Comments: Assisted for all ADLs and IADLs, including feeding ?  ? ? ?Hand Dominance  ? Dominant Hand: Right ? ?  ?Extremity/Trunk Assessment  ? Upper Extremity Assessment ?Upper Extremity Assessment: Overall WFL for tasks assessed ?  ? ?Lower Extremity Assessment ?Lower Extremity Assessment: RLE deficits/detail ?RLE Deficits / Details: s/p R tibial IMN in ace wrap; able to perform partial SLR and LAQ, limited ankle AROM ?  ? ?   ?Communication  ? Communication: Expressive difficulties  ?Cognition Arousal/Alertness: Awake/alert ?Behavior During Therapy: Flat affect ?Overall Cognitive Status: History of cognitive impairments - at baseline ?  ?  ?  ?  ?  ?  ?  ?  ?  ?  ?  ?  ?  ?  ?  ?  ?General Comments: h/o intellectual disability; able to answer yes/no questions and make majority of needs known, appreciative of session saying "thank you" multiple times at end ?  ?  ? ?  ?General Comments General comments (skin integrity, edema, etc.): pt's dad present and supportive; reports DME from L tibial injury (09/2021) still in good shape,  will have necessary assist at home. reviewed educ re: precautions, positioning, cam boot wear, activity recommendations ? ?  ?Exercises    ? ?Assessment/Plan  ?  ?PT Assessment Patient needs continued PT services  ?PT Problem List Decreased strength;Decreased range of motion;Decreased activity tolerance;Decreased balance;Decreased mobility;Decreased cognition;Decreased knowledge of use of DME;Decreased safety awareness;Decreased knowledge of precautions;Pain ? ?   ?  ?PT Treatment Interventions DME instruction;Gait training;Stair training;Functional mobility training;Therapeutic activities;Therapeutic exercise;Balance training;Patient/family education;Wheelchair mobility training   ? ?PT Goals (Current goals can be found in the Care Plan section)  ?Acute Rehab PT Goals ?Patient Stated Goal: return home ?PT Goal Formulation: With family ?Time For Goal Achievement: 12/10/21 ?Potential to Achieve Goals: Good ? ?  ?Frequency Min 5X/week ?  ? ? ?Co-evaluation PT/OT/SLP Co-Evaluation/Treatment: Yes ?Reason for Co-Treatment: Necessary to address cognition/behavior during functional activity;For patient/therapist safety;To address functional/ADL transfers ?PT goals addressed during session: Mobility/safety with mobility;Balance ?OT goals addressed during session: ADL's and self-care ?  ? ? ?  ?AM-PAC PT "6 Clicks" Mobility  ?Outcome Measure Help needed turning from your back to your side while in a flat bed without using bedrails?: A Little ?Help needed moving from lying on your back to sitting on the  side of a flat bed without using bedrails?: A Little ?Help needed moving to and from a bed to a chair (including a wheelchair)?: A Lot ?Help needed standing up from a chair using your arms (e.g., wheelchair or bedside chair)?: A Lot ?Help needed to walk in hospital room?: Total ?Help needed climbing 3-5 steps with a railing? : Total ?6 Click Score: 12 ? ?  ?End of Session Equipment Utilized During Treatment: Gait  belt ?Activity Tolerance: Patient tolerated treatment well ?Patient left: in chair;with call bell/phone within reach;with chair alarm set;with family/visitor present ?Nurse Communication: Mobility status ?PT Visit Diagno

## 2021-11-26 NOTE — Plan of Care (Signed)

## 2021-11-26 NOTE — Op Note (Signed)
11/26/2021 ?6:52 PM ? ?PATIENT:  Julian Gray 36 y.o.  ? ?DATE OF BIRTH: 1986-02-27 ? ?MEDICAL RECORD NUMBER: [id number] ? ?PRE-OPERATIVE DIAGNOSIS:  RIGHT OBLIQUE TIBIAL SHAFT AND PROXIMAL FIBULA FRACTURES ? ?POST-OPERATIVE DIAGNOSIS:  RIGHT OBLIQUE TIBIAL SHAFT AND PROXIMAL FIBULA FRACTURES ? ?PROCEDURE:  Procedure(s): ?RIGHT TIBIAL SHAFT INTRAMEDULLARY NAILING WITH with Biomet Versanail 9 X 360 mm, statically locked ? ?SURGEON:  Surgeon(s) and Role: ?   Myrene Galas, MD - Primary ? ?ASSISTANTS: Montez Morita, PA-C ? ?ANESTHESIA:   none ? ?EBL:  Minimal  ? ?BLOOD ADMINISTERED: None ? ?DRAINS: None  ? ?LOCAL MEDICATIONS USED:  NONE ? ?SPECIMEN:  No Specimen ? ?DISPOSITION OF SPECIMEN:  N/A ? ?COUNTS:  YES ? ?TOURNIQUET:  * No tourniquets in log * ? ?DICTATION: .Note written in EPIC ? ?PLAN OF CARE: Admit to inpatient  ? ?PATIENT DISPOSITION:  PACU - hemodynamically stable. ?  ?Delay start of Pharmacological VTE agent (>24hrs) due to surgical blood loss or risk of bleeding: no ? ?BRIEF SUMMARY AND INDICATIONS FOR PROCEDURE:  Julian Gray is a 36 y.o. who sustained a tibia fracture from a ground level fall yesterday and who underwent repair of the contralateral tibia just two months ago by Dr. Caryn Bee Haddix. Patient is nonverbal with mother at bedside. No apparent pain with passive stretch. I discussed with the patient's mother the risks and benefits of surgery, including the possibility of infection, nerve injury, vessel injury, wound breakdown, arthritis, symptomatic hardware, DVT/ PE, loss of motion, malunion, nonunion, and need for further surgery among others. These risks were acknowledged and consent given to proceed. ? ?BRIEF SUMMARY OF PROCEDURE:  The patient was taken to the operating room ?after administration of Ancef for antibiotics.  The operative extremity ?was prepped and draped in the usual fashion.  No tourniquet was used ?during the procedure.  A 2.5-cm incision was made at the base of the ?distal  pole of patella and extended proximally. A medial parapatellar ?incision was made, and then the curved cannulated awl advanced into the center of ?the proximal tibia just medial to the lateral tibial spine and just anterior to the joint surface. The patient's patella baja made this somewhat challenging. A guidewire ?was then advanced across the fracture site into the middle of the plafond and checked on AP and LAT images, measuring for nail length on the lateral.  We augmented the reduction with a percutaneously placed large tenaculum, and then ?performed sequential reaming, encountering chatter at 9 mm, reaming up to ?10 mm and placing a 9 x 360 mm nail. We were careful to watch alignment throughout and make sure distal locking bolts were anterior to the fibula. Two distal locks were placed with perfect circle technique and then two proximal locks were placed off the jig and checked for position and length.  An assistant was required for the procedure as my assistant performed the reaming and proximal instrumentation while I held reduction. Standard layered closure was performed. Montez Morita, PA-C assisted during reaming and nail placement, as well as wound closure.  The patient was taken to the PACU in stable condition after application of sterile gently ?compressive dressings. ? ?PROGNOSIS:  The patient will be toe touch weightbearing with ?unrestricted motion of the knee and ankle for the next 6 weeks. We may increase to WBAT depending on patient's capacity to tolerate and understand limitations. CAM boot for support as needed. Lovenox for DVT prophylaxis if well tolerated. Full metabolic bone work up will need  to be undertaken including vitamin D, testosterone, and anticonvulsant med review. F/u in the office in 10-14 days for removal of sutures. ? ? ? ? ?Doralee Albino. Carola Frost, M.D.  ?

## 2021-11-26 NOTE — Progress Notes (Addendum)
? ?                              Orthopaedic Trauma Service Progress Note ? ?Patient ID: ?Julian Gray ?MRN: [id number] ?DOB/AGE: 08-Aug-1985 36 y.o. ? ?Subjective: ? ?Doing ok  ?Dad at bedside ?No acute issues of note ? ?Long standing antiepileptic medication use  ?S/p IMN L tibia 09/2021 ? ?Pt was just getting back to normal  ?Does have a history of falling even prior to these 2 episodes that resulted in fractures ? ?Repeat vitamin d levels look good ?Total testosterone is 82 ? ?ROS ?As above ?Objective:  ? ?VITALS:   ?Vitals:  ? 11/25/21 1549 11/25/21 1942 11/26/21 0421 11/26/21 0758  ?BP: (!) 133/96 (!) 140/95 110/86 (!) 157/105  ?Pulse: 78 80 78 (!) 109  ?Resp:  19 18 16   ?Temp:  98.6 ?F (37 ?C) 98.1 ?F (36.7 ?C) 99.3 ?F (37.4 ?C)  ?TempSrc:  Oral Oral   ?SpO2:  98% 97% 92%  ?Weight:      ?Height:      ? ? ?Estimated body mass index is 29.69 kg/m? as calculated from the following: ?  Height as of this encounter: 6\' 1"  (1.854 m). ?  Weight as of this encounter: 102.1 kg. ? ? ?Intake/Output   ?   04/25 0701 ?04/26 0700 04/26 0701 ?04/27 0700  ? I.V. (mL/kg) 1000 (9.8)   ? IV Piggyback 100   ? Total Intake(mL/kg) 1100 (10.8)   ? Urine (mL/kg/hr) 600 (0.2)   ? Blood 50   ? Total Output 650   ? Net +450   ?     ?  ? ?LABS ? ?Results for orders placed or performed during the hospital encounter of 11/24/21 (from the past 24 hour(s))  ?Comprehensive metabolic panel     Status: Abnormal  ? Collection Time: 11/26/21  2:09 AM  ?Result Value Ref Range  ? Sodium 136 135 - 145 mmol/L  ? Potassium 4.3 3.5 - 5.1 mmol/L  ? Chloride 97 (L) 98 - 111 mmol/L  ? CO2 28 22 - 32 mmol/L  ? Glucose, Bld 151 (H) 70 - 99 mg/dL  ? BUN 22 (H) 6 - 20 mg/dL  ? Creatinine, Ser 1.17 0.61 - 1.24 mg/dL  ? Calcium 9.4 8.9 - 10.3 mg/dL  ? Total Protein 7.2 6.5 - 8.1 g/dL  ? Albumin 4.0 3.5 - 5.0 g/dL  ? AST 20 15 - 41 U/L  ? ALT 20 0 - 44 U/L  ? Alkaline Phosphatase 144 (H) 38 - 126 U/L  ? Total Bilirubin 0.8  0.3 - 1.2 mg/dL  ? GFR, Estimated >60 >60 mL/min  ? Anion gap 11 5 - 15  ?CBC     Status: Abnormal  ? Collection Time: 11/26/21  2:09 AM  ?Result Value Ref Range  ? WBC 12.2 (H) 4.0 - 10.5 K/uL  ? RBC 5.20 4.22 - 5.81 MIL/uL  ? Hemoglobin 15.1 13.0 - 17.0 g/dL  ? HCT 44.8 39.0 - 52.0 %  ? MCV 86.2 80.0 - 100.0 fL  ? MCH 29.0 26.0 - 34.0 pg  ? MCHC 33.7 30.0 - 36.0 g/dL  ? RDW 12.8 11.5 - 15.5 %  ? Platelets 242 150 - 400 K/uL  ? nRBC 0.0 0.0 - 0.2 %  ? ? ? ?PHYSICAL EXAM:  ? ?Gen: NAD, sitting up in chair, minimally verbal  ?Lungs: unlabored ?Ext:  ?  Right Lower Extremity  ? CAM boot fitting well ? Dressing clean, dry and intact ? Exam stable ? Ext warm  ? + DP pulse ? Moves toes  ? No pain with passive motion of toes  ? ? ?Assessment/Plan: ?1 Day Post-Op  ? ? ?Anti-infectives (From admission, onward)  ? ? Start     Dose/Rate Route Frequency Ordered Stop  ? 11/25/21 2000  ceFAZolin (ANCEF) IVPB 2g/100 mL premix       ? 2 g ?200 mL/hr over 30 Minutes Intravenous Every 8 hours 11/25/21 1542 11/26/21 2159  ? 11/25/21 0600  ceFAZolin (ANCEF) IVPB 2g/100 mL premix       ? 2 g ?200 mL/hr over 30 Minutes Intravenous On call to O.R. 11/24/21 1947 11/25/21 1319  ? ?  ?. ? ?POD/HD#: 1 ? ?36 y/o male with closed R tibia fracture, low energy mechanism  ? ?- low energy R tibia and fibula fracture  ? After talking with dad it sounds like leg broke and then Kaedyn fell  ? ?- closed R tibia and fibula fracture  ? WBAT in CAM boot  ? Unrestricted motion of knee and ankle ? CAM boot needs to be on when mobilizing  ? Dressing change tomorrow  ? Ice and elevate for swelling and pain control ? ? Therapy evals  ?  ?- Pain management: ? Multimodal  ?  ?- ABL anemia/Hemodynamics ? Stable ? ?- Medical issues  ? Per primary  ? ?- DVT/PE prophylaxis: ? Lovenox while inpatient then,  ? Aspirin 325 mg daily x 30 days  ? ?- ID:  ? Periop abx ? ?- Metabolic Bone Disease: ? Vitamin d levels improved ?  Continue with supplementation  ? ? This is  2nd low energy fracture in 3 months  ?  ? Discussed with dad potential causes  ?  ? Suspect that his long term use of antiepileptic medications is like the cause of his poor bone quality via multiple mechanisms: direct impact on bone, endocrine effects such as testosterone deficiency which seems to be evident with [name] and increased Vitamin d metabolism  ? ?  Would continue with vitamin d supplementation  ?  Will likely add calcium to the regimen but will wait to see what his ionized calcium is  ?  Refer to endocrine to further assess Testosterone deficiency  ?  DEXA scan  ?   If there is osteoporosis present that meets treatment criteria will likely send [facility] fracture liaison service at [facility]  ? ?- Activity: ? WBAT R leg with Cam  ? ?- Impediments to fracture healing: ? As above ? ?- Dispo: ? Therapy evals ? Likely home tomorrow  ? ? ? ?[name], PA-C ?[phone] (C) ?11/26/2021, 9:11 AM ? ?[facility] ?[address] ?[place] [address] ?[phone] [place]) ?[phone] (F) ? ? ? ?After 5pm and on the weekends please log on to Amion, go to orthopaedics and the look under the [facility] Call for the provider(s) on call. You can also call our office at [phone] and then follow the prompts to be connected to the call team.  ? Patient ID: Julian Gray, male   DOB: July 04, 1986, 36 y.o.   MRN: [id number] ? ?

## 2021-11-26 NOTE — Evaluation (Signed)
Occupational Therapy Evaluation and Discharge ?Patient Details ?Name: Julian Gray ?MRN: [id number] ?DOB: 02-16-1986 ?Today's Date: 11/26/2021 ? ? ?History of Present Illness Pt is a 36 y.o. male admitted 11/24/21 after sustaining a fall with R tib/fib fxs. Underwent ORIF R tibia on 11/25/21. 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  ? ?Clinical Impression ?  ?Pt was ambulating without a device and supervision prior to admission. He has a hx of falls. Pt is dependent in all ADLs and lives with his supportive family. Pt presents with reluctance to place weight on R UE with RW and B hand held assist. He was able to pivot to chair with +2 moderate assistance. Family has all necessary DME at home and are familiar with how to manage pt from recent L LE fx. No further OT needs.  ?   ? ?Recommendations for follow up therapy are one component of a multi-disciplinary discharge planning process, led by the attending physician.  Recommendations may be updated based on patient status, additional functional criteria and insurance authorization.  ? ?Follow Up Recommendations ? No OT follow up  ?  ?Assistance Recommended at Discharge Frequent or constant Supervision/Assistance  ?Patient can return home with the following Two people to help with walking and/or transfers;Two people to help with bathing/dressing/bathroom ? ?  ?Functional Status Assessment ? Patient has had a recent decline in their functional status and/or demonstrates limited ability to make significant improvements in function in a reasonable and predictable amount of time  ?Equipment Recommendations ? None recommended by OT  ?  ?Recommendations for Other Services   ? ? ?  ?Precautions / Restrictions Precautions ?Precautions: Fall ?Required Braces or Orthoses: Other Brace ?Other Brace: CAM boot ?Restrictions ?Weight Bearing Restrictions: Yes ?RLE Weight Bearing: Weight  bearing as tolerated ?Other Position/Activity Restrictions: WBAT in CAM boot  ? ?  ? ?Mobility Bed Mobility ?Overal bed mobility: Needs Assistance ?Bed Mobility: Supine to Sit ?  ?  ?Supine to sit: +2 for physical assistance, Min assist ?  ?  ?General bed mobility comments: increased time, assist for R LE over EOB and to raise trunk, HOB up ?  ? ?Transfers ?Overall transfer level: Needs assistance ?Equipment used: Rolling walker (2 wheels), 2 person hand held assist ?Transfers: Sit to/from Stand, Bed to chair/wheelchair/BSC ?Sit to Stand: +2 physical assistance, Mod assist, From elevated surface ?Stand pivot transfers: +2 physical assistance, Mod assist ?  ?  ?  ?  ?General transfer comment: decreased tolerance of weight on R LE, assist to rise and steady ?  ? ?  ?Balance Overall balance assessment: Needs assistance ?  ?Sitting balance-Leahy Scale: Fair ?  ?  ?Standing balance support: Bilateral upper extremity supported ?Standing balance-Leahy Scale: Poor ?  ?  ?  ?  ?  ?  ?  ?  ?  ?  ?  ?  ?   ? ?ADL either performed or assessed with clinical judgement  ? ?ADL Overall ADL's : At baseline ?  ?  ?  ?  ?  ?  ?  ?  ?  ?  ?  ?  ?  ?  ?  ?  ?  ?  ?  ?   ? ? ? ?Vision Ability to See in Adequate Light: 0 Adequate ?Patient Visual Report: No change from baseline ?   ?   ?Perception   ?  ?Praxis   ?  ? ?Pertinent Vitals/Pain Pain  Assessment ?Pain Assessment: Faces ?Faces Pain Scale: Hurts a little bit ?Pain Location: R LE ?Pain Descriptors / Indicators: Guarding, Discomfort ?Pain Intervention(s): Monitored during session, Repositioned  ? ? ? ?Hand Dominance Right ?  ?Extremity/Trunk Assessment Upper Extremity Assessment ?Upper Extremity Assessment: Overall WFL for tasks assessed ?  ?Lower Extremity Assessment ?Lower Extremity Assessment: Defer to PT evaluation ?  ?  ?  ?Communication Communication ?Communication: Expressive difficulties ?  ?Cognition Arousal/Alertness: Awake/alert ?Behavior During Therapy: Flat  affect ?Overall Cognitive Status: History of cognitive impairments - at baseline ?  ?  ?  ?  ?  ?  ?  ?  ?  ?  ?  ?  ?  ?  ?  ?  ?  ?  ?  ?General Comments    ? ?  ?Exercises   ?  ?Shoulder Instructions    ? ? ?Home Living Family/patient expects to be discharged to:: Private residence ?Living Arrangements: Parent ?Available Help at Discharge: Family;Available 24 hours/day ?Type of Home: House ?Home Access: Stairs to enter ?  ?  ?Home Layout: Two level;1/2 bath on main level;Able to live on main level with bedroom/bathroom ?  ?  ?Bathroom Shower/Tub: Psychologist, counselling;Other (comment) ?  ?Bathroom Toilet: Handicapped height ?  ?  ?Home Equipment: Rolling Walker (2 wheels);BSC/3in1;Grab bars - toilet;Grab bars - tub/shower;Wheelchair - manual ?  ?Additional Comments: Lives with parents; sister is licensed caregiver who assists as well. ?  ? ?  ?Prior Functioning/Environment Prior Level of Function : Needs assist ?  ?  ?  ?  ?  ?  ?Mobility Comments: unsteady gait with many falls, but does not use AD ?ADLs Comments: Assisted for all ADLs and IADLs, including feeding ?  ? ?  ?  ?OT Problem List:   ?  ?   ?OT Treatment/Interventions:    ?  ?OT Goals(Current goals can be found in the care plan section) Acute Rehab OT Goals ?OT Goal Formulation: With family  ?OT Frequency:   ?  ? ?Co-evaluation PT/OT/SLP Co-Evaluation/Treatment: Yes ?Reason for Co-Treatment: For patient/therapist safety ?  ?OT goals addressed during session: ADL's and self-care ?  ? ?  ?AM-PAC OT "6 Clicks" Daily Activity     ?Outcome Measure Help from another person eating meals?: A Lot ?Help from another person taking care of personal grooming?: Total ?Help from another person toileting, which includes using toliet, bedpan, or urinal?: Total ?Help from another person bathing (including washing, rinsing, drying)?: Total ?Help from another person to put on and taking off regular upper body clothing?: Total ?Help from another person to put on and taking off  regular lower body clothing?: Total ?6 Click Score: 7 ?  ?End of Session Equipment Utilized During Treatment: Rolling walker (2 wheels);Gait belt ?Nurse Communication: Mobility status ? ?Activity Tolerance: Patient tolerated treatment well ?Patient left: in chair;with call bell/phone within reach;with chair alarm set ? ?OT Visit Diagnosis: Unsteadiness on feet (R26.81)  ?              ?Time: 2683-4196 ?OT Time Calculation (min): 20 min ?Charges:  OT General Charges ?$OT Visit: 1 Visit ?OT Evaluation ?$OT Eval Moderate Complexity: 1 Mod ? ?Martie Round, OTR/L ?Acute Rehabilitation Services ?Pager: [phone] ?Office: [phone]  ? ?Evern Bio ?11/26/2021, 12:54 PM ?

## 2021-11-27 DIAGNOSIS — F840 Autistic disorder: Secondary | ICD-10-CM | POA: Diagnosis not present

## 2021-11-27 DIAGNOSIS — S82301A Unspecified fracture of lower end of right tibia, initial encounter for closed fracture: Secondary | ICD-10-CM | POA: Diagnosis not present

## 2021-11-27 DIAGNOSIS — W19XXXA Unspecified fall, initial encounter: Secondary | ICD-10-CM | POA: Diagnosis not present

## 2021-11-27 LAB — COMPREHENSIVE METABOLIC PANEL
ALT: 18 U/L (ref 0–44)
AST: 27 U/L (ref 15–41)
Albumin: 3.4 g/dL — ABNORMAL LOW (ref 3.5–5.0)
Alkaline Phosphatase: 112 U/L (ref 38–126)
Anion gap: 8 (ref 5–15)
BUN: 22 mg/dL — ABNORMAL HIGH (ref 6–20)
CO2: 26 mmol/L (ref 22–32)
Calcium: 8.7 mg/dL — ABNORMAL LOW (ref 8.9–10.3)
Chloride: 101 mmol/L (ref 98–111)
Creatinine, Ser: 0.87 mg/dL (ref 0.61–1.24)
GFR, Estimated: >60 mL/min (ref >60)
Glucose, Bld: 139 mg/dL — ABNORMAL HIGH (ref 70–99)
Potassium: 3.6 mmol/L (ref 3.5–5.1)
Sodium: 135 mmol/L (ref 135–145)
Total Bilirubin: 0.5 mg/dL (ref 0.3–1.2)
Total Protein: 6.1 g/dL — ABNORMAL LOW (ref 6.5–8.1)

## 2021-11-27 LAB — CBC
HCT: 39 % (ref 39.0–52.0)
Hemoglobin: 13 g/dL (ref 13.0–17.0)
MCH: 29.2 pg (ref 26.0–34.0)
MCHC: 33.3 g/dL (ref 30.0–36.0)
MCV: 87.6 fL (ref 80.0–100.0)
Platelets: 178 10*3/uL (ref 150–400)
RBC: 4.45 MIL/uL (ref 4.22–5.81)
RDW: 12.9 % (ref 11.5–15.5)
WBC: 9.5 10*3/uL (ref 4.0–10.5)
nRBC: 0 % (ref 0.0–0.2)

## 2021-11-27 LAB — CALCIUM, IONIZED: Calcium, Ionized, Serum: 5 mg/dL (ref 4.5–5.6)

## 2021-11-27 MED ORDER — METOPROLOL TARTRATE 12.5 MG HALF TABLET
12.5000 mg | ORAL_TABLET | Freq: Two times a day (BID) | ORAL | Status: DC
  Administered 2021-11-27 – 2021-11-28 (×3): 12.5 mg via ORAL
  Filled 2021-11-27 (×3): qty 1

## 2021-11-27 NOTE — Progress Notes (Signed)
?PROGRESS NOTE ? ?Julian Gray [id number] DOB: Oct 05, 1985 DOA: 11/24/2021 ?PCP: Haywood Pao, MD ? ? LOS: 3 days  ? ?Brief Narrative / Interim history: ?36 year old male with perinatal anoxic ischemic brain injury, intellectual disability, autism, seizure disorder, remote history of esophageal perforation with pneumomediastinum who comes into the hospital after a fall at home.  He is minimally verbal, but apparently he was walking to the bathroom when he fell on the floor.  He was complaining of right leg pain and he was brought to the hospital.  Of note, he has a history of recurrent falls, and was hospitalized in February 2023 with left lower extremity fracture status post IM nailing.  Orthopedic surgery was consulted ? ?Subjective / 24h Interval events: ?Very drowsy all day today.  Barely unable to work with PT or stay awake.  A little bit more alert when I saw him again this afternoon. ? ?Assesement and Plan: ?Principal Problem: ?  Closed fracture of right lower extremity ?Active Problems: ?  Perinatal anoxic-ischemic brain injury ?  Intellectual disability ?  Autism spectrum ?  Seizure disorder ([place]) ?  H/O healed fragility fracture ?  Pathological fracture, right tibia, initial encounter for fracture ? ?Principal problem ?Closed fracture of the right lower extremity-patient comes into the hospital after a fall, imaging showed right distal tibial diaphysis fracture and right proximal fibular diaphysis fracture.  Orthopedic surgery consulted, he is status post IM nail fixation on the right, 4/25.  Currently on Lovenox for DVT prophylaxis, and will go home on aspirin.  Very drowsy today, received IV Dilaudid last night.  We will stop IV Dilaudid and use Vicodin alone ? ?Active problems ?Seizure disorder-continue home medications ? ?Perinatal anoxic ischemic brain injury, autism spectrum disorder-monitor, continue home medications, he appears at baseline ? ?Nausea, vomiting-shortly after admission,  intermittent, possibly due to pain/pain medications.  Antiemetics, supportive treatment.  No further nausea or vomiting today ? ?Scheduled Meds: ? acetaminophen  500 mg Oral Q12H  ? clozapine  50 mg Oral TID  ? enoxaparin (LOVENOX) injection  40 mg Subcutaneous Q24H  ? metoprolol tartrate  12.5 mg Oral BID  ? oxcarbazepine  600 mg Oral BID  ? sodium chloride flush  3 mL Intravenous Q12H  ? ?Continuous Infusions: ? ? ?PRN Meds:.HYDROcodone-acetaminophen, hydrOXYzine, ondansetron (ZOFRAN) IV, polyethylene glycol, prochlorperazine ? ?Diet Orders (From admission, onward)  ? ?  Start     Ordered  ? 11/25/21 1543  Diet regular Room service appropriate? Yes; Fluid consistency: Thin  Diet effective now       ?Question Answer Comment  ?Room service appropriate? Yes   ?Fluid consistency: Thin   ?  ? 11/25/21 1542  ? ?  ?  ? ?  ? ? ?DVT prophylaxis: enoxaparin (LOVENOX) injection 40 mg Start: 11/26/21 2200 ? ? ?Lab Results  ?Component Value Date  ? PLT 178 11/27/2021  ? ? ?  Code Status: Full Code ? ?Family Communication: Father present at bedside ? ?Status is: Inpatient ? ?Remains inpatient appropriate because: Very drowsy ? ?Level of care: Med-Surg ? ?Consultants:  ?Orthopedic surgery ? ?Procedures:  ?none ? ?Microbiology  ?none ? ?Antimicrobials: ?none  ? ? ?Objective: ?Vitals:  ? 11/26/21 1828 11/26/21 1938 11/27/21 0900 11/27/21 1427  ?BP: (!) 163/93 (!) 139/96 (!) 167/98 (!) 141/87  ?Pulse: (!) 105 (!) 112 100 (!) 110  ?Resp: 15 18 18 18   ?Temp: 97.6 ?F (36.4 ?C) 98.1 ?F (36.7 ?C) 98.4 ?F (36.9 ?C) 98.2 ?F (36.8 ?C)  ?  TempSrc: Oral Oral Oral Oral  ?SpO2: 94% 94% 93% 95%  ?Weight:      ?Height:      ? ? ?Intake/Output Summary (Last 24 hours) at 11/27/2021 1523 ?Last data filed at 11/26/2021 1600 ?Gross per 24 hour  ?Intake 200 ml  ?Output --  ?Net 200 ml  ? ? ?Wt Readings from Last 3 Encounters:  ?11/25/21 102.1 kg  ?09/04/21 104.3 kg  ?09/29/19 97.5 kg  ? ? ?Examination: ? ?Constitutional: NAD ?Eyes: lids and conjunctivae  normal, no scleral icterus ?ENMT: mmm ?Neck: normal, supple ?Respiratory: clear to auscultation bilaterally, no wheezing, no crackles.  ?Cardiovascular: Regular rate and rhythm, no murmurs / rubs / gallops.  ?Abdomen: soft, no distention, no tenderness. Bowel sounds positive.  ?Skin: no rashes ? ? ? ?Data Reviewed: I have independently reviewed following labs and imaging studies ? ?CBC ?Recent Labs  ?Lab 11/24/21 ?1702 11/25/21 ?0321 11/26/21 ?0209 11/27/21 ?1110  ?WBC 10.7* 9.7 12.2* 9.5  ?HGB 15.0 15.8 15.1 13.0  ?HCT 46.0 47.4 44.8 39.0  ?PLT 211 249 242 178  ?MCV 88.0 87.0 86.2 87.6  ?MCH 28.7 29.0 29.0 29.2  ?MCHC 32.6 33.3 33.7 33.3  ?RDW 12.5 12.8 12.8 12.9  ?LYMPHSABS 1.1  --   --   --   ?MONOABS 0.7  --   --   --   ?EOSABS 0.1  --   --   --   ?BASOSABS 0.0  --   --   --   ? ? ? ?Recent Labs  ?Lab 11/24/21 ?1702 11/25/21 ?0321 11/26/21 ?0209 11/27/21 ?1110  ?NA 138 135 136 135  ?K 4.2 4.2 4.3 3.6  ?CL 101 100 97* 101  ?CO2 27 20* 28 26  ?GLUCOSE 105* 128* 151* 139*  ?BUN 12 16 22* 22*  ?CREATININE 0.91 1.04 1.17 0.87  ?CALCIUM 9.7 9.7  9.9 9.4 8.7*  ?AST  --  28 20 27   ?ALT  --  25 20 18   ?ALKPHOS  --  162* 144* 112  ?BILITOT  --  0.9 0.8 0.5  ?ALBUMIN  --  4.3 4.0 3.4*  ?MG  --  2.1  --   --   ? ? ? ?------------------------------------------------------------------------------------------------------------------ ?No results for input(s): CHOL, HDL, LDLCALC, TRIG, CHOLHDL, LDLDIRECT in the last 72 hours. ? ?No results found for: HGBA1C ?------------------------------------------------------------------------------------------------------------------ ?No results for input(s): TSH, T4TOTAL, T3FREE, THYROIDAB in the last 72 hours. ? ?Invalid input(s): FREET3 ? ?Cardiac Enzymes ?No results for input(s): CKMB, TROPONINI, MYOGLOBIN in the last 168 hours. ? ?Invalid input(s): CK ?------------------------------------------------------------------------------------------------------------------ ?No results found  for: BNP ? ?CBG: ?No results for input(s): GLUCAP in the last 168 hours. ? ?No results found for this or any previous visit (from the past 240 hour(s)).  ? ?Radiology Studies: ?No results found. ? ? ?Marzetta Board, MD, PhD ?[facility] ? ?Between 7 am - 7 pm I am available, please contact me via Amion (for emergencies) or Securechat (non urgent messages) ? ?Between 7 pm - 7 am I am not available, please contact night coverage MD/APP via Amion ? ?

## 2021-11-27 NOTE — Progress Notes (Signed)
Mobility Specialist Progress Note  ? ? 11/27/21 1556  ?Mobility  ?Activity Transferred from chair to bed  ?Level of Assistance +2 (takes two people)  ?Assistive Device Front wheel walker  ?RLE Weight Bearing WBAT  ?Activity Response Tolerated well  ?$Mobility charge 1 Mobility  ? ?Pt received and agreeable. No complaints. Left with call bell in reach and father present.  ? ?[place] ?Mobility Specialist  ?Primary: 5N M.S. Phone: [phone] ?Secondary: 6N M.S. Phone: [phone] ?  ?

## 2021-11-27 NOTE — Plan of Care (Signed)

## 2021-11-27 NOTE — Progress Notes (Signed)
? ?                              [facility] Progress Note ? ?Patient ID: ?Julian Gray ?MRN: [id number] ?DOB/AGE: [birth year] 34 y.o. ? ?Subjective: ? ?Sleepy today ?Did not do well with therapy this afternoon  ? ?No other issues of note ? ?Pain seems to be controlled  ? ?ROS ? ?As above ? ?Objective:  ? ?VITALS:   ?Vitals:  ? 11/26/21 0758 11/26/21 1828 11/26/21 1938 11/27/21 0900  ?BP: (!) 157/105 (!) 163/93 (!) 139/96 (!) 167/98  ?Pulse: (!) 109 (!) 105 (!) 112 100  ?Resp: 16 15 18 18   ?Temp: 99.3 ?F (37.4 ?C) 97.6 ?F (36.4 ?C) 98.1 ?F (36.7 ?C) 98.4 ?F (36.9 ?C)  ?TempSrc: Oral Oral Oral Oral  ?SpO2: 92% 94% 94% 93%  ?Weight:      ?Height:      ? ? ?Estimated body mass index is 29.69 kg/m? as calculated from the following: ?  Height as of this encounter: 6\' 1"  (1.854 m). ?  Weight as of this encounter: 102.1 kg. ? ? ?Intake/Output   ?   04/26 0701 ?04/27 0700 04/27 0701 ?04/28 0700  ? I.V. (mL/kg)    ? IV Piggyback 200   ? Total Intake(mL/kg) 200 (2)   ? Urine (mL/kg/hr)    ? Blood    ? Total Output    ? Net +200   ?     ?  ? ?LABS ? ?Results for orders placed or performed during the hospital encounter of 11/24/21 (from the past 24 hour(s))  ?Comprehensive metabolic panel     Status: Abnormal  ? Collection Time: 11/27/21 11:10 AM  ?Result Value Ref Range  ? Sodium 135 135 - 145 mmol/L  ? Potassium 3.6 3.5 - 5.1 mmol/L  ? Chloride 101 98 - 111 mmol/L  ? CO2 26 22 - 32 mmol/L  ? Glucose, Bld 139 (H) 70 - 99 mg/dL  ? BUN 22 (H) 6 - 20 mg/dL  ? Creatinine, Ser 0.87 0.61 - 1.24 mg/dL  ? Calcium 8.7 (L) 8.9 - 10.3 mg/dL  ? Total Protein 6.1 (L) 6.5 - 8.1 g/dL  ? Albumin 3.4 (L) 3.5 - 5.0 g/dL  ? AST 27 15 - 41 U/L  ? ALT 18 0 - 44 U/L  ? Alkaline Phosphatase 112 38 - 126 U/L  ? Total Bilirubin 0.5 0.3 - 1.2 mg/dL  ? GFR, Estimated >60 >60 mL/min  ? Anion gap 8 5 - 15  ?CBC     Status: None  ? Collection Time: 11/27/21 11:10 AM  ?Result Value Ref Range  ? WBC 9.5  4.0 - 10.5 K/uL  ? RBC 4.45 4.22 - 5.81 MIL/uL  ? Hemoglobin 13.0 13.0 - 17.0 g/dL  ? HCT 39.0 39.0 - 52.0 %  ? MCV 87.6 80.0 - 100.0 fL  ? MCH 29.2 26.0 - 34.0 pg  ? MCHC 33.3 30.0 - 36.0 g/dL  ? RDW 12.9 11.5 - 15.5 %  ? Platelets 178 150 - 400 K/uL  ? nRBC 0.0 0.0 - 0.2 %  ? ? ? ?PHYSICAL EXAM:  ? ?Gen: NAD, sitting up in chair, minimally verbal ?Lungs: unlabored ?Ext:  ?     Right Lower Extremity  ?            CAM boot adjusted  ?  Dressing clean, dry and intact ?            Exam stable ?            Ext warm  ?            + DP pulse ?            Moves toes  ?            No pain with passive motion of toes  ?  ? ?Assessment/Plan: ?2 Days Post-Op  ? ? ? ?Anti-infectives (From admission, onward)  ? ? Start     Dose/Rate Route Frequency Ordered Stop  ? 11/25/21 2000  ceFAZolin (ANCEF) IVPB 2g/100 mL premix       ? 2 g ?200 mL/hr over 30 Minutes Intravenous Every 8 hours 11/25/21 1542 11/26/21 1511  ? 11/25/21 0600  ceFAZolin (ANCEF) IVPB 2g/100 mL premix       ? 2 g ?200 mL/hr over 30 Minutes Intravenous On call to O.R. 11/24/21 1947 11/25/21 1319  ? ?  ?. ? ?POD/HD#: 2 ? ?  ?36 y/o male with closed R tibia fracture, low energy mechanism  ?  ?- low energy R tibia and fibula fracture  ?            After talking with dad it sounds like leg broke and then Hanzel fell  ?  ?- closed R tibia and fibula fracture  ?            WBAT in CAM boot  ?            Unrestricted motion of knee and ankle ?            CAM boot needs to be on when mobilizing  ?            Dressing change tomorrow  ?            Ice and elevate for swelling and pain control ?  ?            Therapies  ?             ?- Pain management: ?            Multimodal  ?             ?- ABL anemia/Hemodynamics ?            Stable ?  ?- Medical issues  ?            Per primary  ?  ?- DVT/PE prophylaxis: ?            Lovenox while inpatient then,  ?            Aspirin 325 mg daily x 30 days  ?  ?- ID:  ?            Periop abx completed  ?  ?- Metabolic Bone  Disease: ?            Vitamin d levels improved ?                        Continue with supplementation  ?  ?            This is 2nd low energy fracture in 3 months  ?             ?  Discussed with dad potential causes  ?             ?            Suspect that his epilepsy and his long term use of antiepileptic medications is like the cause of his poor bone quality via multiple mechanisms: direct impact on bone, endocrine effects such as testosterone deficiency which seems to be evident with Julian Gray and increased Vitamin d metabolism  ?  ?                        Would continue with vitamin d supplementation  ?                        Will likely add calcium to the regimen but will wait to see what his ionized calcium is  ?                        Refer to endocrine to further assess Testosterone deficiency  ?                        DEXA scan  ?                                    If there is osteoporosis present that meets treatment criteria [facility]  ?  ?- Activity: ?            WBAT R leg with Cam  ?  ?- Impediments to fracture healing: ?            As above ?  ?- Dispo: ?            Therapies ?            Likely home tomorrow  ? ?Mearl LatinKeith W. Lorrine Killilea, PA-C ?[phone] (C) ?11/27/2021, 2:25 PM ? ?[facility] ?[address] ?[place] [address] ?[phone] [place](O) ?[phone] (F) ? ? ? ?After 5pm and on the weekends please log on to Amion, go to orthopaedics and the look under the [facility] Call for the provider(s) on call. You can also call our office at [phone] and then follow the prompts to be connected to the call team.  ? Patient ID: Julian FlakesKyle R Gray, male   DOB: 1985-10-01, 36 y.o.   MRN: [id number] ? ?

## 2021-11-27 NOTE — TOC Initial Note (Addendum)
Transition of Care (TOC) - Initial/Assessment Note  ? ? ?Patient Details  ?Name: Julian Gray ?MRN: [id number] ?Date of Birth: Jan 25, 1986 ? ?Transition of Care [facility]) CM/SW Contact:    ?Epifanio Lesches, RN ?Phone Number: ?11/27/2021, 10:21 AM ? 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 From home with parents.  ?NCMspoke with pt's dad regarding d/c planning. Shared recommendation from PT for HHPT. Dad agreeable. States preference is [facility], has used in the past. Referral made with [facility] and accepted.    ?  ?TOC team following... ? ?Expected Discharge Plan: Home w Home Health Services ?Barriers to Discharge: Continued Medical Work up ? ? ?Patient Goals and CMS Choice ?  ?  ?Choice offered to / list presented to : Patient, Parent ? ?Expected Discharge Plan and Services ?Expected Discharge Plan: Home w Home Health Services ?  ?  ?  ?Living arrangements for the past 2 months: Single Family Home ?                ?  ?  ?  ?  ?  ?  ?  ?  ?  ?  ? ?Prior Living Arrangements/Services ?Living arrangements for the past 2 months: Single Family Home ?Lives with:: Parents ?Patient language and need for interpreter reviewed:: Yes ?Do you feel safe going back to the place where you live?: Yes      ?Need for Family Participation in Patient Care: Yes (Comment) ?Care giver support system in place?: Yes (comment) ?Current home services: DME (3N1 and wheelchair) ?Criminal Activity/Legal Involvement Pertinent to Current Situation/Hospitalization: No - Comment as needed ? ?Activities of Daily Living ?Home Assistive Devices/Equipment: Gilmer Mor (specify quad or straight), Wheelchair, Bedside commode/3-in-1 ?ADL Screening (condition at time of admission) ?Patient's cognitive ability adequate to safely complete daily activities?: No ?Is the patient deaf or have difficulty hearing?: No ?Does the  patient have difficulty seeing, even when wearing glasses/contacts?: No ?Does the patient have difficulty concentrating, remembering, or making decisions?: No ?Patient able to express need for assistance with ADLs?: No ?Does the patient have difficulty dressing or bathing?: Yes ?Independently performs ADLs?: No ?Communication: Needs assistance ?Is this a change from baseline?: Pre-admission baseline ?Dressing (OT): Needs assistance ?Is this a change from baseline?: Pre-admission baseline ?Grooming: Needs assistance ?Is this a change from baseline?: Pre-admission baseline ?Feeding: Needs assistance ?Is this a change from baseline?: Pre-admission baseline ?Bathing: Needs assistance ?Is this a change from baseline?: Pre-admission baseline ?Toileting: Needs assistance ?Is this a change from baseline?: Pre-admission baseline ?In/Out Bed: Needs assistance ?Is this a change from baseline?: Pre-admission baseline ?Walks in Home: Needs assistance ?Is this a change from baseline?: Pre-admission baseline ?Does the patient have difficulty walking or climbing stairs?: Yes ?Weakness of Legs: None ?Weakness of Arms/Hands: None ? ?Permission Sought/Granted ?  ?Permission granted to share information with : Yes, Verbal Permission Granted ? Share Information with NAME: Hyrum Shaneyfelt (Father)  [phone], Levander Katzenstein (Mother)  [phone] ?   ?   ?   ? ?Emotional Assessment ?  ?  ?  ?  ?Alcohol / Substance Use: Not Applicable ?Psych Involvement: No (comment) ? ?Admission diagnosis:  Fall, initial encounter [W19.XXXA] ?Closed fracture of right lower extremity [S82.91XA] ?Closed fracture of distal end of right tibia, unspecified fracture morphology, initial encounter [S82.301A] ?Patient Active Problem List  ? Diagnosis Date Noted  ? H/O  healed fragility fracture 11/26/2021  ? Pathological fracture, right tibia, initial encounter for fracture 11/26/2021  ? Closed fracture of right lower extremity 11/24/2021  ? Closed fracture of left  lower extremity, initial encounter 09/03/2021  ? Complication of gastrostomy tube (HCC) 09/09/2019  ? Normocytic anemia 09/09/2019  ? Seizure disorder (HCC) 09/09/2019  ? Insomnia 09/09/2019  ? Pneumomediastinum (HCC) 08/24/2019  ? Esophageal perforation 08/24/2019  ? Intellectual disability 02/27/2015  ? Autism spectrum 02/27/2015  ? 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 ?PCP:  Gaspar Garbe, MD ?Pharmacy:   ?[id number] - [place], [place] - [address] [facility] [address] ?[address] ? [place] [address] ?Phone: [phone] Fax: [phone] ? ? ? ? ?Social Determinants of Health (SDOH) Interventions ?  ? ?Readmission Risk Interventions ?   ? View : No data to display.  ?  ?  ?  ? ? ? ?

## 2021-11-27 NOTE — Care Management Important Message (Signed)
Important Message ? ?Patient Details  ?Name: Julian Gray ?MRN: [id number] ?Date of Birth: [birth year] ? ? ?Medicare Important Message Given:  Yes ? ? ? ? ?Ynez Eugenio ?11/27/2021, 4:04 PM ?

## 2021-11-27 NOTE — Progress Notes (Signed)
Physical Therapy Treatment ?Patient Details ?Name: Julian Gray ?MRN: [id number] ?DOB: Jun 27, 1986 ?Today's Date: 11/27/2021 ? ? ?History of Present Illness Pt is a 36 y.o. male admitted 11/24/21 after sustaining a fall with R tib/fib fxs. Underwent ORIF R tibia on 11/25/21. PMH includes perniatal anoxic brain injury, intellectual disability disorder, autism spectrum disorder, epilepsy. ?  ?PT Comments  ? ? Pt slowly progressing with mobility; per pt's father, pt "does not trust" his R leg to stand on yet, which limits mobility progression. Today, pt demonstrates some improvement in standing tolerance, continues to require modA+2 for standing transfers. Pt with bladder/bowel incontinence, dependent for pericare/washup with standing. Pt's father present and supportive. Continue to recommend HHPT services to maximize functional mobility and independence upon return home. ?   ?Recommendations for follow up therapy are one component of a multi-disciplinary discharge planning process, led by the attending physician.  Recommendations may be updated based on patient status, additional functional criteria and insurance authorization. ? ?Follow Up Recommendations ? Home health PT ?  ?  ?Assistance Recommended at Discharge Frequent or constant Supervision/Assistance  ?Patient can return home with the following A lot of help with walking and/or transfers;A lot of help with bathing/dressing/bathroom;Help with stairs or ramp for entrance ?  ?Equipment Recommendations ? None recommended by PT  ?  ?Recommendations for Other Services   ? ? ?  ?Precautions / Restrictions Precautions ?Precautions: Fall;Other (comment) ?Precaution Comments: bladder/bowel incontinence ?Required Braces or Orthoses: Other Brace ?Other Brace: CAM boot ?Restrictions ?Weight Bearing Restrictions: Yes ?RLE Weight Bearing: Weight bearing as tolerated ?Other Position/Activity Restrictions: WBAT in CAM boot  ?  ? ?Mobility ? Bed Mobility ?Overal bed mobility: Needs  Assistance ?Bed Mobility: Supine to Sit ?  ?  ?Supine to sit: Min assist, HOB elevated ?  ?  ?General bed mobility comments: increased time with repeated verbal cues; pt able to manage RLE to EOB, minA for HHA to elevate trunk ?  ? ?Transfers ?Overall transfer level: Needs assistance ?Equipment used: 2 person hand held assist, Rolling walker (2 wheels) ?Transfers: Sit to/from Stand, Bed to chair/wheelchair/BSC ?Sit to Stand: Mod assist, +2 physical assistance ?  ?Step pivot transfers: Mod assist, +2 physical assistance ?  ?  ?  ?General transfer comment: multiple attempts to stand from EOB to RW, pt repeatedly attempting to pull on RW then sitting back down, eventually able to stand with bilat HHA and modA+2 for trnk elevation and stability, brief pivotal steps to recliner with modA+2; seated rest then additional 2x sit<>stand from recliner for pericare due to bladder/bowel incontinence, repeated verbal cues for sequencing; pt demonstrates slight improvement in ability to WBAT through RLE and achieve fully upright posture ?  ? ?Ambulation/Gait ?  ?  ?  ?  ?  ?  ?Pre-gait activities: pt with difficulty sequencing with RW or weight shifting onto RLE in order to progress gait ?  ? ? ?Stairs ?  ?  ?  ?  ?  ? ? ?Wheelchair Mobility ?  ? ?Modified Rankin (Stroke Patients Only) ?  ? ? ?  ?Balance Overall balance assessment: Needs assistance ?Sitting-balance support: No upper extremity supported, Feet supported ?Sitting balance-Leahy Scale: Fair ?Sitting balance - Comments: requires assist to don cam boot ?  ?Standing balance support: Bilateral upper extremity supported ?Standing balance-Leahy Scale: Poor ?Standing balance comment: reliant on external assist for static standing, dependent for posterior pericare/washup while standing ?  ?  ?  ?  ?  ?  ?  ?  ?  ?  ?  ?  ? ?  ?  Cognition Arousal/Alertness: Awake/alert ?Behavior During Therapy: Flat affect ?Overall Cognitive Status: History of cognitive impairments - at  baseline ?  ?  ?  ?  ?  ?  ?  ?  ?  ?  ?  ?  ?  ?  ?  ?  ?General Comments: h/o intellectual disability; able to answer yes/no questions and make majority of needs known, appreciative of session saying "thank you" multiple times at end ?  ?  ? ?  ?Exercises General Exercises - Lower Extremity ?Long Arc Quad: AAROM, Right, Seated ?Straight Leg Raises: AROM, Right, Seated (partial range) ? ?  ?General Comments General comments (skin integrity, edema, etc.): pt's dad Rosanne Ashing) present and supportive ?  ?  ? ?Pertinent Vitals/Pain Pain Assessment ?Pain Assessment: Faces ?Faces Pain Scale: Hurts a little bit ?Pain Location: R LE ?Pain Descriptors / Indicators: Guarding, Discomfort ?Pain Intervention(s): Monitored during session, Limited activity within patient's tolerance  ? ? ?Home Living   ?  ?  ?  ?  ?  ?  ?  ?  ?  ?   ?  ?Prior Function    ?  ?  ?   ? ?PT Goals (current goals can now be found in the care plan section) Progress towards PT goals: Progressing toward goals ? ?  ?Frequency ? ? ? Min 5X/week ? ? ? ?  ?PT Plan Current plan remains appropriate  ? ? ?Co-evaluation   ?  ?  ?  ?  ? ?  ?AM-PAC PT "6 Clicks" Mobility   ?Outcome Measure ? Help needed turning from your back to your side while in a flat bed without using bedrails?: A Little ?Help needed moving from lying on your back to sitting on the side of a flat bed without using bedrails?: A Little ?Help needed moving to and from a bed to a chair (including a wheelchair)?: A Lot ?Help needed standing up from a chair using your arms (e.g., wheelchair or bedside chair)?: A Lot ?Help needed to walk in hospital room?: Total ?Help needed climbing 3-5 steps with a railing? : Total ?6 Click Score: 12 ? ?  ?End of Session Equipment Utilized During Treatment: Gait belt ?Activity Tolerance: Patient tolerated treatment well;Patient limited by fatigue ?Patient left: in chair;with call bell/phone within reach;with chair alarm set;with family/visitor present ?Nurse  Communication: Mobility status ?PT Visit Diagnosis: Other abnormalities of gait and mobility (R26.89);Muscle weakness (generalized) (M62.81);Pain ?Pain - Right/Left: Right ?Pain - part of body: Leg ?  ? ? ?Time: 1204-1228 ?PT Time Calculation (min) (ACUTE ONLY): 24 min ? ?Charges:  $Therapeutic Activity: 23-37 mins          ?          ? ?Ina Homes, PT, DPT ?Acute Rehabilitation Services  ?Pager [phone] ?Office [phone] ? ?Malachy Chamber ?11/27/2021, 12:59 PM ? ?

## 2021-11-27 NOTE — Progress Notes (Signed)
Pt father at bedside. He refused staffs to perform blood draw, hygiene, and vital signs am in order to allow pt to rest. Pt is resting comfortably. ?

## 2021-11-28 ENCOUNTER — Other Ambulatory Visit (HOSPITAL_COMMUNITY): Payer: Self-pay

## 2021-11-28 DIAGNOSIS — Z87310 Personal history of (healed) osteoporosis fracture: Secondary | ICD-10-CM | POA: Diagnosis not present

## 2021-11-28 DIAGNOSIS — M84461A Pathological fracture, right tibia, initial encounter for fracture: Secondary | ICD-10-CM

## 2021-11-28 DIAGNOSIS — F840 Autistic disorder: Secondary | ICD-10-CM | POA: Diagnosis not present

## 2021-11-28 MED ORDER — ASPIRIN 325 MG PO TBEC
325.0000 mg | DELAYED_RELEASE_TABLET | Freq: Every day | ORAL | 0 refills | Status: DC
  Filled 2021-11-28: qty 30, 30d supply, fill #0

## 2021-11-28 MED ORDER — HYDROCODONE-ACETAMINOPHEN 5-325 MG PO TABS
1.0000 | ORAL_TABLET | Freq: Three times a day (TID) | ORAL | 0 refills | Status: DC | PRN
  Filled 2021-11-28: qty 21, 7d supply, fill #0

## 2021-11-28 NOTE — TOC Transition Note (Signed)
Transition of Care (TOC) - CM/SW Discharge Note ? ? ?Patient Details  ?Name: Julian Gray ?MRN: [id number] ?Date of Birth: 12/15/1985 ? ?Transition of Care [facility]) CM/SW Contact:  ?Epifanio Lesches, RN ?Phone Number: ?11/28/2021, 1:44 PM ? ? ?Clinical Narrative:    ?Patient will DC to: home ?Anticipated DC date: 11/28/2021 ?Family notified: yes ?Transport by: car ? ?     - s/p ORIF R tibia, 11/25/21. hx of perinatal anoxic brain injury, intellectual disability disorder, autism spectrum disorder, epilepsy ?Per MD patient ready for DC today. RN, patient, patient's family, and [facility] notified of DC.   ? ?Pt without DME needs. ? ?Post hospital f/u noted on AVS. ? ?TOC pharmacy to deliver Rx meds  prior to d/c. ? ?Parents to provide transportation to home. ? ?RNCM will sign off for now as intervention is no longer needed. Please consult [place] again if new needs arise.  ? ? ?Final next level of care: Home w Home Health Services ?Barriers to Discharge: No Barriers Identified ? ? ?Patient Goals and CMS Choice ?  ?  ?Choice offered to / list presented to : Patient, Parent ? ?Discharge Placement ?  ?           ?  ?  ?  ?  ? ?Discharge Plan and Services ?  ?  ?           ?  ?  ?  ?  ?  ?  ?  ?  ?  ?  ? ?Social Determinants of Health (SDOH) Interventions ?  ? ? ?Readmission Risk Interventions ?   ? View : No data to display.  ?  ?  ?  ? ? ? ? ? ?

## 2021-11-28 NOTE — Progress Notes (Signed)
Physical Therapy Treatment ?Patient Details ?Name: Julian Gray ?MRN: [id number] ?DOB: 1985/10/09 ?Today's Date: 11/28/2021 ? ? ?History of Present Illness Pt is a 36 y.o. male admitted 11/24/21 after sustaining a fall with R tib/fib fxs. Underwent ORIF R tibia on 11/25/21. PMH includes perniatal anoxic brain injury, intellectual disability disorder, autism spectrum disorder, epilepsy. ? ?  ?PT Comments  ? ? Pt continues to progress slowly towards goals. Pt demonstrates continued improvement this session in ability to tolerate standing and weightbearing through RLE, continues to require +2 assist to come to standing and to step pivot to chair. Pt able to advance RLE in boot during transfer and self propel RW during transfer. Pt father present, very supportive and helpful. Current plan remains appropriate to address deficits and maximize functional independence upon return to home.  ?  ?Recommendations for follow up therapy are one component of a multi-disciplinary discharge planning process, led by the attending physician.  Recommendations may be updated based on patient status, additional functional criteria and insurance authorization. ? ?Follow Up Recommendations ? Home health PT ?  ?  ?Assistance Recommended at Discharge Frequent or constant Supervision/Assistance  ?Patient can return home with the following A lot of help with walking and/or transfers;A lot of help with bathing/dressing/bathroom;Help with stairs or ramp for entrance ?  ?Equipment Recommendations ? None recommended by PT  ?  ?Recommendations for Other Services   ? ? ?  ?Precautions / Restrictions Precautions ?Precautions: Fall;Other (comment) ?Precaution Comments: bladder/bowel incontinence ?Required Braces or Orthoses: Other Brace ?Other Brace: CAM boot ?Restrictions ?Weight Bearing Restrictions: Yes ?RLE Weight Bearing: Weight bearing as tolerated ?Other Position/Activity Restrictions: WBAT in CAM boot  ?  ? ?Mobility ? Bed Mobility ?Overal bed  mobility: Needs Assistance ?Bed Mobility: Supine to Sit ?  ?  ?Supine to sit: Min assist, HOB elevated ?  ?  ?General bed mobility comments: increased time with repeated verbal cues; pt able to manage RLE to EOB, minA for HHA to elevate trunk ?  ? ?Transfers ?Overall transfer level: Needs assistance ?Equipment used: Rolling walker (2 wheels) ?Transfers: Sit to/from Stand, Bed to chair/wheelchair/BSC ?Sit to Stand: Min assist, +2 physical assistance ?Stand pivot transfers: Mod assist, +2 physical assistance ?  ?  ?  ?  ?General transfer comment: able to come to standing with mina+2 with RW, shifting weight to RLE in standing, brief pivotal steps to recliner with minA+2;  pt demonstrates improvement in ability to WBAT through RLE and achieve fully upright posture ?  ? ?Ambulation/Gait ?  ?  ?  ?  ?  ?  ?  ?  ? ? ?Stairs ?  ?  ?  ?  ?  ? ? ?Wheelchair Mobility ?  ? ?Modified Rankin (Stroke Patients Only) ?  ? ? ?  ?Balance Overall balance assessment: Needs assistance ?Sitting-balance support: No upper extremity supported, Feet supported ?Sitting balance-Leahy Scale: Fair ?Sitting balance - Comments: requires assist to don cam boot ?  ?Standing balance support: Bilateral upper extremity supported ?Standing balance-Leahy Scale: Poor ?Standing balance comment: reliant on external assist for static standing, dependent for posterior pericare/washup while standing ?  ?  ?  ?  ?  ?  ?  ?  ?  ?  ?  ?  ? ?  ?Cognition Arousal/Alertness: Awake/alert ?Behavior During Therapy: Flat affect ?Overall Cognitive Status: History of cognitive impairments - at baseline ?  ?  ?  ?  ?  ?  ?  ?  ?  ?  ?  ?  ?  ?  ?  ?  ?  General Comments: h/o intellectual disability; able to answer yes/no questions and make majority of needs known, appreciative of session saying "thank you" multiple times at end ?  ?  ? ?  ?Exercises General Exercises - Lower Extremity ?Long Arc Quad: AROM, Right, 10 reps, Seated ?Hip Flexion/Marching: AROM, Right, 10 reps,  Seated ? ?  ?General Comments   ?  ?  ? ?Pertinent Vitals/Pain Pain Assessment ?Pain Assessment: Faces ?Faces Pain Scale: Hurts a little bit ?Pain Location: R LE ?Pain Descriptors / Indicators: Guarding, Discomfort ?Pain Intervention(s): Limited activity within patient's tolerance, Monitored during session, Repositioned  ? ? ?Home Living   ?  ?  ?  ?  ?  ?  ?  ?  ?  ?   ?  ?Prior Function    ?  ?  ?   ? ?PT Goals (current goals can now be found in the care plan section) Acute Rehab PT Goals ?PT Goal Formulation: With family ?Time For Goal Achievement: 12/10/21 ? ?  ?Frequency ? ? ? Min 5X/week ? ? ? ?  ?PT Plan Current plan remains appropriate  ? ? ?Co-evaluation   ?  ?  ?  ?  ? ?  ?AM-PAC PT "6 Clicks" Mobility   ?Outcome Measure ? Help needed turning from your back to your side while in a flat bed without using bedrails?: A Little ?Help needed moving from lying on your back to sitting on the side of a flat bed without using bedrails?: A Little ?Help needed moving to and from a bed to a chair (including a wheelchair)?: A Lot ?Help needed standing up from a chair using your arms (e.g., wheelchair or bedside chair)?: A Lot ?Help needed to walk in hospital room?: Total ?Help needed climbing 3-5 steps with a railing? : Total ?6 Click Score: 12 ? ?  ?End of Session Equipment Utilized During Treatment: Gait belt ?Activity Tolerance: Patient tolerated treatment well;Patient limited by fatigue ?Patient left: in chair;with call bell/phone within reach;with family/visitor present ?Nurse Communication: Mobility status ?PT Visit Diagnosis: Other abnormalities of gait and mobility (R26.89);Muscle weakness (generalized) (M62.81);Pain ?Pain - Right/Left: Right ?Pain - part of body: Leg ?  ? ? ?Time: 2119-4174 ?PT Time Calculation (min) (ACUTE ONLY): 16 min ? ?Charges:  $Therapeutic Activity: 8-22 mins          ?          ? ?Lenora Boys. PTA ?Acute Rehabilitation Services ?Office: [phone] ? ? ? ?Marlana Salvage Jowell Bossi ?11/28/2021, 11:07 AM ? ?

## 2021-11-28 NOTE — Discharge Summary (Addendum)
? ?Physician Discharge Summary  ?Julian FlakesKyle R Elenes ZOX:[id number] DOB: May 03, 1986 DOA: 11/24/2021 ? ?PCP: Gaspar Garbeisovec, Richard W, MD ? ?Admit date: 11/24/2021 ?Discharge date: 11/28/2021 ? ?Admitted From: home ?Disposition:  home ? ?Recommendations for Outpatient Follow-up:  ?Follow up with PCP and orthopedic surgery in 1-2 weeks ? ?Home Health: PT ?Equipment/Devices: none ? ?Discharge Condition: stable ?CODE STATUS: Full code ?Diet Orders (From admission, onward)  ? ?  Start     Ordered  ? 11/25/21 1543  Diet regular Room service appropriate? Yes; Fluid consistency: Thin  Diet effective now       ?Question Answer Comment  ?Room service appropriate? Yes   ?Fluid consistency: Thin   ?  ? 11/25/21 1542  ? ?  ?  ? ?  ? ?HPI: Per admitting MD, ?Julian Gray is a 36 y.o. male with medical history significant of perinatal anoxic-ischemic brain injury, intellectual disability, autism spectrum, seizure disorder, history of esophageal perforation and pneumomediastinum who presents following a fall at home. Due to patient's baseline intellectual disability and minimally verbal status history obtained with assistance of chart review and family.  Patient was walking to the bathroom and pivoting around the couch when he fell hard to the floor earlier today.  After the fall he is noted to have significant pain in his right lower extremity. ?  ?Hospital Course / Discharge diagnoses: ?Principal Problem: ?  Closed fracture of right lower extremity ?Active Problems: ?  Perinatal anoxic-ischemic brain injury ?  Intellectual disability ?  Autism spectrum ?  Seizure disorder (HCC) ?  H/O healed fragility fracture ?  Pathological fracture, right tibia, initial encounter for fracture ? ? ?Principal problem ?Closed fracture of the right lower extremity-patient comes into the hospital after a fall, imaging showed right distal tibial diaphysis fracture and right proximal fibular diaphysis fracture.  Orthopedic surgery consulted, he is status post IM  nail fixation on the right, 4/25.  Patient recovered well postoperatively, able to work and progressing well with physical therapy, pain is controlled on oral agents and he will be discharged home in stable condition, with home health PT.  Orthopedic surgery recommends aspirin for 30 days which was prescribed ?  ?Active problems ?Seizure disorder-continue home medications ?Perinatal anoxic ischemic brain injury, autism spectrum disorder-monitor, continue home medications, he appears at baseline ?Nausea, vomiting-shortly after admission, intermittent, possibly due to pain/pain medications.  Antiemetics, supportive treatment.  This has resolved and he is able to tolerate a regular diet ?Metabolic bone disease-second low energy fracture in 3 months, continue vitamin D supplementation, his total testosterone was low with increased binding globulin, but free testosterone was normal.  Will need outpatient endocrine evaluation ? ?Sepsis ruled out ? ? ?Discharge Instructions ? ? ?Allergies as of 11/28/2021   ?No Known Allergies ?  ? ?  ?Medication List  ?  ? ?TAKE these medications   ? ?acetaminophen 160 MG/5ML solution ?Commonly known as: TYLENOL ?Take 20.3 mLs (650 mg total) by mouth every 6 (six) hours as needed for mild pain or fever. ?  ?aspirin 325 MG EC tablet ?Take 1 tablet (325 mg total) by mouth daily. ?  ?clozapine 50 MG tablet ?Commonly known as: CLOZARIL ?Take 1 tablet (50 mg total) by mouth 3 (three) times daily. ?  ?HYDROcodone-acetaminophen 5-325 MG tablet ?Commonly known as: NORCO/VICODIN ?Take 1 tablet by mouth every 8 (eight) hours as needed for moderate pain or severe pain. ?What changed:  ?how much to take ?when to take this ?reasons to take this ?  ?  hydrOXYzine 25 MG tablet ?Commonly known as: ATARAX ?Take 25 mg by mouth every 6 (six) hours as needed for anxiety. ?  ?metoprolol tartrate 25 MG tablet ?Commonly known as: LOPRESSOR ?Take 0.5 tablets (12.5 mg total) by mouth 2 (two) times daily. ?   ?multivitamin with minerals Tabs tablet ?Take 1 tablet by mouth daily. ?  ?oxcarbazepine 600 MG tablet ?Commonly known as: TRILEPTAL ?Take 1 tablet (600 mg total) by mouth 2 (two) times daily. ?  ?polyethylene glycol 17 g packet ?Commonly known as: MIRALAX / GLYCOLAX ?Take 17 g by mouth daily. ?  ?VITAMIN D PO ?Take 1 capsule by mouth daily. ?  ? ?  ? ? Follow-up Information   ? ? Myrene Galas, MD. Schedule an appointment as soon as possible for a visit in 2 week(s).   ?Specialty: Orthopedic Surgery ?Contact information: ?[address] ?[place] [address] ?[phone] ? ? ?  ?  ? ?  ?  ? ?  ? ? ?Consultations: ?Orthopedic surgery  ? ?Procedures/Studies: ? ?DG Tibia/Fibula Right ? ?Result Date: 11/25/2021 ?CLINICAL DATA:  Intramedullary rod fixation of right tibia. EXAM: RIGHT TIBIA AND FIBULA - 2 VIEW; DG C-ARM 1-60 MIN-NO REPORT Radiation exposure index: 1.59 mGy. COMPARISON:  November 24, 2021. FINDINGS: Ten intraoperative fluoroscopic images were obtained of the right tibia. These images demonstrate intramedullary rod fixation of distal right tibial fracture. IMPRESSION: Fluoroscopic guidance provided during intramedullary rod fixation of distal right tibial fracture. Electronically Signed   By: Lupita Raider M.D.   On: 11/25/2021 14:41  ? ?DG Tibia/Fibula Right ? ?Result Date: 11/24/2021 ?CLINICAL DATA:  Fall. EXAM: RIGHT TIBIA AND FIBULA - 2 VIEW COMPARISON:  None. FINDINGS: There is an acute oblique fracture through the distal tibial diaphysis. There is 6 mm of lateral distraction of the distal fracture fragment. No significant angulation. There is an acute oblique fracture through the proximal fibular diaphysis. There is 1 shaft with lateral displacement of the distal fracture fragment with mild apex lateral angulation. There is no evidence for dislocation. Joint spaces are maintained. There is soft tissue swelling surrounding the fractures. IMPRESSION: 1. Acute fracture of the distal tibial diaphysis.  2. Acute fracture of the proximal fibular diaphysis. Electronically Signed   By: Darliss Cheney M.D.   On: 11/24/2021 15:37  ? ?DG Ankle Complete Right ? ?Result Date: 11/24/2021 ?CLINICAL DATA:  Tripped and fell when going to the bathroom. EXAM: RIGHT ANKLE - COMPLETE 3+ VIEW COMPARISON:  None. FINDINGS: There is an acute, comminuted obliquely oriented fracture deformity involving the distal diaphysis of the tibia. There is posterior and lateral displacement of the distal fracture fragments by approximately 7 mm. IMPRESSION: Acute, comminuted obliquely oriented fracture deformity involves the distal diaphysis of the tibia. Electronically Signed   By: Signa Kell M.D.   On: 11/24/2021 15:40  ? ?DG Tibia/Fibula Right Port ? ?Result Date: 11/25/2021 ?CLINICAL DATA:  Postop ORIF right tibia and fibula. EXAM: PORTABLE RIGHT TIBIA AND FIBULA - 2 VIEW COMPARISON:  Right tibia and fibula radiographs 11/24/2021 FINDINGS: Interval intramedullary nail fixation of the previously seen distal tibial oblique fracture. Improved, near anatomic alignment. Redemonstration of oblique fracture of the proximal fibular diaphysis with slightly improved alignment but persistent mild 3 mm lateral step-off of the distal component with respect of the proximal component. IMPRESSION: Interval intramedullary nail fixation of distal tibial diaphyseal fracture with improved alignment. Electronically Signed   By: Neita Garnet M.D.   On: 11/25/2021 17:04  ? ?DG C-Arm  1-60 Min-No Report ? ?Result Date: 11/25/2021 ?Fluoroscopy was utilized by the requesting physician.  No radiographic interpretation.   ? ? ?Subjective: ?- no chest pain, shortness of breath, no abdominal pain, nausea or vomiting.  ? ?Discharge Exam: ?BP (!) 141/82 (BP Location: Right Arm)   Pulse 88   Temp 98.9 ?F (37.2 ?C) (Oral)   Resp 18   Ht 6\' 1"  (1.854 m)   Wt 102.1 kg   SpO2 94%   BMI 29.69 kg/m?  ? ?General: Pt is alert, awake, not in acute distress ?Cardiovascular: RRR,  S1/S2 +, no rubs, no gallops ?Respiratory: CTA bilaterally, no wheezing, no rhonchi ?Abdominal: Soft, NT, ND, bowel sounds + ?Extremities: no edema, no cyanosis ? ? ?The results of significant diagnostics from this

## 2021-11-28 NOTE — Discharge Instructions (Signed)
? ?Orthopaedic Trauma Service Discharge Instructions ? ? ?General Discharge Instructions ? ?Orthopaedic Injuries: ? Right tibia and fibula fracture treated with intramedullary nailing of tibia ? ?WEIGHT BEARING STATUS: Weightbearing as tolerated in cam boot.  Cam boot can be off when not ambulating ? ?RANGE OF MOTION/ACTIVITY: Unrestricted range of motion of knee and ankle. ? ?Bone health: Vitamin D levels are improved.  However this is the second low-energy fracture.  As discussed in the hospital he will be referred to endocrinology to evaluate his testosterone deficiency and we will obtain a DEXA scan to evaluate his bone density ? ?Review the following resource for additional information regarding bone health ? ?[email or web address].[email or web address]://www.bonehealthandosteoporosis.org/ ? ?Wound Care: Daily wound care as needed starting on 11/30/2021.  Can use silicone foam dressings over the incisions if there is drainage otherwise leave open to the air.  Clean with soap and water only ? ? ?Discharge Wound Care Instructions ? ?Do NOT apply any ointments, solutions or lotions to pin sites or surgical wounds.  These prevent needed drainage and even though solutions like hydrogen peroxide kill bacteria, they also damage cells lining the pin sites that help fight infection.  Applying lotions or ointments can keep the wounds moist and can cause them to breakdown and open up as well. This can increase the risk for infection. When in doubt call the office. ? ?Surgical incisions should be dressed daily. ? ?If any drainage is noted, use one layer of adaptic or Mepitel, then gauze, Kerlix, and an ace wrap.  Alternatively you can use a silicone foam dressing (Mepitel) and an Ace  wrap ? ?NetCamper.czhttps://www.amazon.com/Johnson-Systagenix-ADAPTIC-Non-Adhering-Dressing/dp/B000ODY90A ?https://dennis-soto.com/https://www.amazon.com/Mepitel-[email or web address]-4x7-Each/dp/B01LMO5C6O/ref=pd_lpo_3?pd_rd_i=B01LMO5C6O&th=1 ? ?http://rojas.com/https://www.amazon.com/Silicone-Dressing-Border-Adhesive-Waterproof/dp/B07MNV4CQJ ? ?These dressing supplies should be available at local medical supply stores ([facility] [employer], [facility], etc). They are not usually carried at places like [facility], [facility], [facility], etc ? ?Once the incision is completely dry and without drainage, it may be left open to air out.  Showering may begin 36-48 hours later.  Cleaning gently with soap and water. ?  ? ?DVT/PE prophylaxis: Aspirin 325 mg daily for the next 30 days ? ?Diet: as you were eating previously.  Can use over the counter stool softeners and bowel preparations, such as Miralax, to help with bowel movements.  Narcotics can be constipating.  Be sure to drink plenty of fluids ? ?PAIN MEDICATION USE AND EXPECTATIONS ? You have likely been given narcotic medications to help control your pain.  After a traumatic event that results in an fracture (broken bone) with or without surgery, it is ok to use narcotic pain medications to help control one's pain.  We understand that everyone responds to pain differently and each individual patient will be evaluated on a regular basis for the continued need for narcotic medications. Ideally, narcotic medication use should last no more than 6-8 weeks (coinciding with fracture healing).  ? As a patient it is your responsibility as well to monitor narcotic medication use and report the amount and frequency you use these medications when you come to your office visit.  ? We would also advise that if you are using narcotic medications, you should take a dose prior to therapy to maximize you participation. ? ?IF YOU ARE ON NARCOTIC MEDICATIONS IT IS NOT PERMISSIBLE TO OPERATE A MOTOR VEHICLE (MOTORCYCLE/CAR/TRUCK/MOPED) OR HEAVY  MACHINERY ?DO NOT MIX NARCOTICS WITH OTHER CNS (CENTRAL NERVOUS SYSTEM) DEPRESSANTS SUCH AS ALCOHOL ? ? ?POST-OPERATIVE OPIOID TAPER INSTRUCTIONS: ?It is important to wean off of your opioid medication as soon as possible. If you do  not need pain medication after your surgery it is ok to stop day one. ?Opioids include: ?Codeine, Hydrocodone(Norco, Vicodin), Oxycodone(Percocet, oxycontin) and hydromorphone amongst others.  ?Long term and even short term use of opiods can cause: ?Increased pain response ?Dependence ?Constipation ?Depression ?Respiratory depression ?And more.  ?Withdrawal symptoms can include ?Flu like symptoms ?Nausea, vomiting ?And more ?Techniques to manage these symptoms ?Hydrate well ?Eat regular healthy meals ?Stay active ?Use relaxation techniques(deep breathing, meditating, yoga) ?Do Not substitute Alcohol to help with tapering ?If you have been on opioids for less than two weeks and do not have pain than it is ok to stop all together.  ?Plan to wean off of opioids ?This plan should start within one week post op of your fracture surgery  ?Maintain the same interval or time between taking each dose and first decrease the dose.  ?Cut the total daily intake of opioids by one tablet each day ?Next start to increase the time between doses. ?The last dose that should be eliminated is the evening dose.  ? ? ?STOP SMOKING OR USING NICOTINE PRODUCTS!!!! ? As discussed nicotine severely impairs your body's ability to heal surgical and traumatic wounds but also impairs bone healing.  Wounds and bone heal by forming microscopic blood vessels (angiogenesis) and nicotine is a vasoconstrictor (essentially, shrinks blood vessels).  Therefore, if vasoconstriction occurs to these microscopic blood vessels they essentially disappear and are unable to deliver necessary nutrients to the healing tissue.  This is one modifiable factor that you can do to dramatically increase your chances of healing your injury.    ? (This means no smoking, no nicotine gum, patches, etc) ? ?DO NOT USE NONSTEROIDAL ANTI-INFLAMMATORY DRUGS (NSAID'S) ? Using products such as Advil (ibuprofen), Aleve (naproxen), Motrin (ibuprofen) for additional pain control during fracture healing can delay and/or prevent the healing response.  If you would like to take over the counter (OTC) medication, Tylenol (acetaminophen) is ok.  However, some narcotic medications that are given for pain control contain acetaminophen as well. Therefore, you should not exceed more than 4000 mg of tylenol in a day if you do not have liver disease.  Also note that there are may OTC medicines, such as cold medicines and allergy medicines that my contain tylenol as well.  If you have any questions about medications and/or interactions please ask your doctor/PA or your pharmacist.  ?   ? ?ICE AND ELEVATE INJURED/OPERATIVE EXTREMITY ? Using ice and elevating the injured extremity above your heart can help with swelling and pain control.  Icing in a pulsatile fashion, such as 20 minutes on and 20 minutes off, can be followed.   ? Do not place ice directly on skin. Make sure there is a barrier between to skin and the ice pack.   ? Using frozen items such as frozen peas works well as the conform nicely to the are that needs to be iced. ? ?USE AN ACE WRAP OR TED HOSE FOR SWELLING CONTROL ? In addition to icing and elevation, Ace wraps or TED hose are used to help limit and resolve swelling.  It is recommended to use Ace wraps or TED hose until you are informed to stop.   ? When using Ace Wraps start the wrapping distally (farthest away from the body) and wrap proximally (closer to the body) ?  Example: If you had surgery on your leg or thing and you do not have a splint on, start the ace wrap at the toes and work  your way up to the thigh ?       If you had surgery on your upper extremity and do not have a splint on, start the ace wrap at your fingers and work your way up to the upper  arm ? ?IF YOU ARE IN A SPLINT OR CAST DO NOT REMOVE IT FOR ANY REASON  ? If your splint gets wet for any reason please contact the office immediately. You may shower in your splint or cast as long as you keep it dry.  This can be done by wrapping in a cast cover or garbage back (or similar) ? Do Not stick any thing down your splint or cast such as pencils, money, or hangers to try and scratch yourself with.  If you feel itchy take benadryl as prescribed on the bottle for itching ? ?IF YOU

## 2021-11-28 NOTE — Progress Notes (Signed)
Pt discharged home with parents in stable condition. Discharge instructions provided to parents, who verbalized understanding. Scripts sent to pharmacy of choice. No immediate questions or concerns at this time. Discharged from unit via wheelchair.  ?

## 2021-11-28 NOTE — Progress Notes (Signed)
? ?                              Orthopaedic Trauma [facility] Progress Note ? ?Patient ID: ?Julian Gray ?MRN: [id number] ?DOB/AGE: 34-Dec-1987 34 y.o. ? ?Subjective: ? ?Ortho issues stable ?No acute events overnight  ? ?ROS ?As above ? ?Objective:  ? ?VITALS:   ?Vitals:  ? 11/27/21 0900 11/27/21 1427 11/27/21 1936 11/28/21 0844  ?BP: (!) 167/98 (!) 141/87 (!) 143/91 (!) 141/82  ?Pulse: 100 (!) 110 (!) 104 88  ?Resp: 18 18 19 18   ?Temp: 98.4 ?F (36.9 ?C) 98.2 ?F (36.8 ?C) 98.3 ?F (36.8 ?C) 98.9 ?F (37.2 ?C)  ?TempSrc: Oral Oral Oral Oral  ?SpO2: 93% 95% 95% 94%  ?Weight:      ?Height:      ? ? ?Estimated body mass index is 29.69 kg/m? as calculated from the following: ?  Height as of this encounter: 6\' 1"  (1.854 m). ?  Weight as of this encounter: 102.1 kg. ? ? ?Intake/Output   ?   04/27 0701 ?04/28 0700 04/28 0701 ?04/29 0700  ? IV Piggyback    ? Total Intake(mL/kg)    ? Urine (mL/kg/hr) 800 (0.3)   ? Total Output 800   ? Net -800   ?     ?  ? ?LABS ? ?Results for orders placed or performed during the hospital encounter of 11/24/21 (from the past 24 hour(s))  ?Comprehensive metabolic panel     Status: Abnormal  ? Collection Time: 11/27/21 11:10 AM  ?Result Value Ref Range  ? Sodium 135 135 - 145 mmol/L  ? Potassium 3.6 3.5 - 5.1 mmol/L  ? Chloride 101 98 - 111 mmol/L  ? CO2 26 22 - 32 mmol/L  ? Glucose, Bld 139 (H) 70 - 99 mg/dL  ? BUN 22 (H) 6 - 20 mg/dL  ? Creatinine, Ser 0.87 0.61 - 1.24 mg/dL  ? Calcium 8.7 (L) 8.9 - 10.3 mg/dL  ? Total Protein 6.1 (L) 6.5 - 8.1 g/dL  ? Albumin 3.4 (L) 3.5 - 5.0 g/dL  ? AST 27 15 - 41 U/L  ? ALT 18 0 - 44 U/L  ? Alkaline Phosphatase 112 38 - 126 U/L  ? Total Bilirubin 0.5 0.3 - 1.2 mg/dL  ? GFR, Estimated >60 >60 mL/min  ? Anion gap 8 5 - 15  ?CBC     Status: None  ? Collection Time: 11/27/21 11:10 AM  ?Result Value Ref Range  ? WBC 9.5 4.0 - 10.5 K/uL  ? RBC 4.45 4.22 - 5.81 MIL/uL  ? Hemoglobin 13.0 13.0 - 17.0 g/dL  ? HCT 39.0 39.0 -  52.0 %  ? MCV 87.6 80.0 - 100.0 fL  ? MCH 29.2 26.0 - 34.0 pg  ? MCHC 33.3 30.0 - 36.0 g/dL  ? RDW 12.9 11.5 - 15.5 %  ? Platelets 178 150 - 400 K/uL  ? nRBC 0.0 0.0 - 0.2 %  ? ? ? ?PHYSICAL EXAM:  ? ?Gen: in bed, resting, looks comfortable ?Lungs: unlabored ?Ext:  ?     Right Lower Extremity  ?            CAM boot is off  ?            Dressing clean, dry and intact ?  Dressings changed ?  All wounds are stable.  No drainage.  No erythema or other signs of infection ? Swelling  is well controlled ?            Good perfusion distally ?            Ext warm  ?            + DP pulse ? ?Assessment/Plan: ?3 Days Post-Op  ? ? ? ?Anti-infectives (From admission, onward)  ? ? Start     Dose/Rate Route Frequency Ordered Stop  ? 11/25/21 2000  ceFAZolin (ANCEF) IVPB 2g/100 mL premix       ? 2 g ?200 mL/hr over 30 Minutes Intravenous Every 8 hours 11/25/21 1542 11/26/21 1511  ? 11/25/21 0600  ceFAZolin (ANCEF) IVPB 2g/100 mL premix       ? 2 g ?200 mL/hr over 30 Minutes Intravenous On call to O.R. 11/24/21 1947 11/25/21 1319  ? ?  ?. ? ?POD/HD#: 3 ? ?36 y/o male with closed R tibia fracture, low energy mechanism  ?  ?- low energy R tibia and fibula fracture  ?            After talking with dad it sounds like leg broke and then Julian Gray fell  ?  ?- closed R tibia and fibula fracture  ?            WBAT in CAM boot  ?            Unrestricted motion of knee and ankle ?            CAM boot needs to be on when mobilizing  ?            Dressing changed today ?  Can change again in 2 to 3 days ?  Once drainage ceases can be left open to the air ?  Clean with soap and water only ?            Ice and elevate for swelling and pain control ?  ?            Therapies  ?             ?- Pain management: ?            Multimodal  ? Minimal narcotic need ?             ?- ABL anemia/Hemodynamics ?            Stable ?  ?- Medical issues  ?            Per primary  ?  ?- DVT/PE prophylaxis: ?            Lovenox while inpatient then,  ?            Aspirin  325 mg daily x 30 days  ?  ?- ID:  ?            Periop abx completed  ?  ?- Metabolic Bone Disease: ?            Vitamin d levels improved ?                        Continue with supplementation  ?  ?            This is 2nd low energy fracture in 3 months  ?             ?            Discussed with dad potential causes  ?             ?  Suspect that his epilepsy and his long term use of antiepileptic medications is like the cause of his poor bone quality via multiple mechanisms: direct impact on bone, endocrine effects such as testosterone deficiency which seems to be evident with Julian Gray and increased Vitamin d metabolism  ?  ?                        Would continue with vitamin d supplementation  ?                        Will likely add calcium to the regimen but will wait to see what his ionized calcium is  ?                        Refer to endocrine to further assess Testosterone deficiency  ?                        DEXA scan  ?                                    If there is osteoporosis present that meets treatment criteria will likely send [facility] fracture liaison service at [facility]  ?  ?- Activity: ?            WBAT R leg with Cam  ?  ?- Impediments to fracture healing: ?            As above ?  ?- Dispo: ?            Home today ? Follow-up with orthopedics in 10-14 ? ? ?[name], PA-C ?[phone] (C) ?11/28/2021, 10:58 AM ? ?[facility] ?[address] ?[place] [address] ?[phone]) ?[phone] (F) ? ? ? ?After 5pm and on the weekends please log on to Amion, go to orthopaedics and the look under the [facility] Call for the provider(s) on call. You can also call our office at [phone] and then follow the prompts to be connected to the call team.  ? Patient ID: Julian Gray, male   DOB: Mar 18, 1986, 36 y.o.   MRN: [id number] ? ?

## 2021-12-02 LAB — TESTOSTERONE, % FREE: Testosterone-% Free: 1.2 % — ABNORMAL HIGH (ref 0.2–0.7)

## 2022-01-21 ENCOUNTER — Other Ambulatory Visit: Payer: Self-pay | Admitting: *Deleted

## 2022-01-21 ENCOUNTER — Other Ambulatory Visit: Payer: Self-pay | Admitting: Student

## 2022-01-21 DIAGNOSIS — M810 Age-related osteoporosis without current pathological fracture: Secondary | ICD-10-CM

## 2022-02-11 LAB — LAB REPORT - SCANNED: EGFR: 129.1

## 2022-02-18 LAB — MICROALBUMIN/CREATININE RATIO, UR
Albumin, Urine POC: 3
Creatinine, POC: 94.7 mg/dL
Microalb Creat Ratio: 3

## 2022-07-06 ENCOUNTER — Ambulatory Visit
Admission: RE | Admit: 2022-07-06 | Discharge: 2022-07-06 | Disposition: A | Discharge status: Home / Self Care | Payer: Medicare Other | Source: Ambulatory Visit | Attending: Student | Admitting: Student

## 2022-07-06 DIAGNOSIS — M810 Age-related osteoporosis without current pathological fracture: Secondary | ICD-10-CM

## 2022-08-31 NOTE — Progress Notes (Signed)
Name: Julian Gray  MRN/ DOB: [id number], April 26, 1986    Age/ Sex: 37 y.o., male    PCP: Tisovec, Fransico Him, MD   Reason for Endocrinology Evaluation: Osteoporosis     Date of Initial Endocrinology Evaluation: 09/01/2022     HPI: Julian Gray is a 37 y.o. male with a past medical history of seizure disorder, intellectual disability, autism spectrum. The patient presented for initial endocrinology clinic visit on 09/01/2022 for consultative assistance with his osteoporosis.    Patient has been referred here for further evaluation of history of fragility fracture  Of note, the patient is on the autistic spectrum, with intellectual disability and nonverbal  He presented to the ED 09/03/2021 with a distal tibia/fibular shaft fracture, status post intramedullary nailing of the left tibia 09/04/2021.  He was followed with a left tibia fracture/2023  His falls seem to have happened from a standing position No family history of osteoporosis No prior antiresorptive therapy    DXA scan was normal 07/2022  In reviewing his records, the patient has been noted with low PTH at 13 PG/mL in April 2023 with a normal concomitant serum calcium of 9.9 mg/DL  He also has been noted with low testosterone at 82 NG/DL in April 2023  Today he is accompanied by his parents who provided the history   Denies dental issues  Went through puberty around age 4  Denies family history of osteoporosis   Has hx of myofascial sx a age 64 months and torticollis sx  He is on D3 1000 iu daily  Centrum vitamin  contains Calcium 210 mg     HISTORY:  Past Medical History:  Past Medical History:  Diagnosis Date   Epilepsy ([place])    H/O healed fragility fracture 11/26/2021   Mental retardation    Pathological fracture, right tibia, initial encounter for fracture 11/26/2021   Personal history of extracorporeal membrane oxygenation (ECMO)    Past Surgical History:  Past Surgical History:  Procedure  Laterality Date   CHEST TUBE INSERTION Right 08/24/2019   Procedure: CHEST TUBE INSERTION;  Surgeon: Lajuana Matte, MD;  [facility];  Service: Thoracic;  Laterality: Right;   ESOPHAGOGASTRODUODENOSCOPY Right 08/24/2019   Procedure: ESOPHAGOGASTRODUODENOSCOPY (EGD) percutaneous gastrostomy tube placement, esophageal stent placement, right chest tube placement;  Surgeon: Lajuana Matte, MD;  [facility];  Service: Thoracic;  Laterality: Right;   ESOPHAGOGASTRODUODENOSCOPY N/A 09/11/2019   Procedure: ESOPHAGOGASTRODUODENOSCOPY (EGD) esophageal stent removal, esophagram.  will need C-arm, and omniview oral contrast;  Surgeon: Lajuana Matte, MD;  Location: [place];  Service: Thoracic;  Laterality: N/A;   IR REPLC GASTRO/COLONIC TUBE PERCUT W/FLUORO  09/10/2019   REMOVAL OF GASTROSTOMY TUBE N/A 08/24/2019   Procedure: PERCUTANEOUS INSERTION OF GASTROSTOMY TUBE;  Surgeon: Lajuana Matte, MD;  Location: [place];  Service: Thoracic;  Laterality: N/A;   TIBIA IM NAIL INSERTION Left 09/04/2021   Procedure: INTRAMEDULLARY (IM) NAIL TIBIAL;  Surgeon: Shona Needles, MD;  Location: [place];  Service: Orthopedics;  Laterality: Left;   TIBIA IM NAIL INSERTION Right 11/25/2021   Procedure: INTRAMEDULLARY (IM) NAIL TIBIAL;  Surgeon: Altamese Tenkiller, MD;  Location: [place];  Service: Orthopedics;  Laterality: Right;   VIDEO BRONCHOSCOPY  09/11/2019   Procedure: Video Bronchoscopy;  Surgeon: Lajuana Matte, MD;  Location: [place];  Service: Thoracic;;    Social History:  reports that he has never smoked. He has never used smokeless tobacco. He reports that he  does not drink alcohol and does not use drugs. Family History: family history is not on file.   HOME MEDICATIONS: Allergies as of 09/01/2022   No Known Allergies      Medication List        Accurate as of September 01, 2022 11:38 AM. If you have any questions, ask your nurse or doctor.          acetaminophen 160 MG/5ML  solution Commonly known as: TYLENOL Take 20.3 mLs (650 mg total) by mouth every 6 (six) hours as needed for mild pain or fever.   aspirin EC 325 MG tablet Take 1 tablet (325 mg total) by mouth daily.   clozapine 50 MG tablet Commonly known as: CLOZARIL Take 1 tablet (50 mg total) by mouth 3 (three) times daily.   HYDROcodone-acetaminophen 5-325 MG tablet Commonly known as: NORCO/VICODIN Take 1 tablet by mouth every 8 (eight) hours as needed for moderate pain or severe pain.   hydrOXYzine 25 MG tablet Commonly known as: ATARAX Take 25 mg by mouth every 6 (six) hours as needed for anxiety.   metoprolol tartrate 25 MG tablet Commonly known as: LOPRESSOR Take 0.5 tablets (12.5 mg total) by mouth 2 (two) times daily.   multivitamin with minerals Tabs tablet Take 1 tablet by mouth daily.   oxcarbazepine 600 MG tablet Commonly known as: TRILEPTAL Take 1 tablet (600 mg total) by mouth 2 (two) times daily.   polyethylene glycol 17 g packet Commonly known as: MIRALAX / GLYCOLAX Take 17 g by mouth daily.   VITAMIN D PO Take 1 capsule by mouth daily.          REVIEW OF SYSTEMS: A comprehensive ROS was conducted with the patient and is negative except as per HPI    OBJECTIVE:  VS: BP 124/72 (BP Location: Right Arm, Patient Position: Sitting, Cuff Size: Large)   Pulse 97   Ht 6\' 1"  (1.854 m)   Wt 222 lb (100.7 kg)   SpO2 100%   BMI 29.29 kg/m    Wt Readings from Last 3 Encounters:  11/25/21 225 lb (102.1 kg)  09/04/21 230 lb (104.3 kg)  09/29/19 215 lb (97.5 kg)     EXAM: General: Pt appears well and is in NAD, walker present  Neck: General: Supple without adenopathy. Thyroid: Thyroid size normal.  No goiter or nodules appreciated.   Lungs: Clear with good BS bilat   Heart: Auscultation: RRR.  Genital: Deferred  Extremities:  BL LE: No pretibial edema   Mental Status: Judgment, insight: Intact Orientation: Oriented to time, place, and person Mood and  affect: No depression, anxiety, or agitation     DATA REVIEWED:   Latest Reference Range & Units 11/25/21 03:21  Sex Horm Binding Glob, Serum 16.5 - 55.9 nmol/L 57.7 (H)  Testosterone 264 - 916 ng/dL 82 (L)  Testosterone Free 8.7 - 25.1 pg/mL 24.6  Testosterone-% Free 0.2 - 0.7 % 1.2 (H)  PTH, Intact 15 - 65 pg/mL 13 (L)  Calcium, Total (PTH) 8.7 - 10.2 mg/dL 9.9  PTH Interp  Comment      DXA 07/06/2022  AP Spine L1-L4 (L2,L3) 07/06/2022 34.6 -0.9 1.087 g/cm2   DualFemur Total Left 07/06/2022 34.6 -0.3 1.033 g/cm2   DualFemur Total Mean 07/06/2022 34.6 -0.3 1.043 g/cm2   Left Forearm Radius 33% 07/06/2022 34.6 -0.5 0.945 g/cm2   ASSESSMENT: With a Z-score of -0.9, this patient's BMD is within the expected range for age.   Old records , labs and  images have been reviewed.    ASSESSMENT/PLAN/RECOMMENDATIONS:   Hypogonadism:  -Patient with history of low testosterone -I was unable to proceed with gentle testing due to intellectual disability and the patient very nonverbal -We discussed primary testicular failure versus pituitary in origin -We discussed the difference between testosterone therapy versus clomiphene -Patient will have labs done in the fasting status, presenting 8 AM for fasting labs will  cause hardship, I have advised family the latest for testosterone checkup is 10 AM -I will present to the [place] lab on [address]  -We discussed benefits of general wellbeing and bone health with normal testosterone levels  2.Hx fragility fracture:  -Unclear cause at this time -DXA with normal Z-score -Will check testosterone, magnesium, phosphorus, PTH and calcium -Emphasized the importance of optimizing calcium and vitamin D intake  Continue multivitamin daily Continue vitamin D 1000 IU daily Start calcium carbonate 600 mg daily   3. Vitamin D Insufficiency :  -History of low vitamin D - will recheck  Follow-up in 6 months  Signed electronically  by: Lyndle Herrlich, MD  [facility] Endocrinology  [facility] [address] [place], [place] [address] Phone: [phone] FAX: [phone]   CC: Gaspar Garbe, MD [address] [place] [address] Phone: [phone] Fax: [phone]   Return to Endocrinology clinic as below: Future Appointments  Date Time Provider Department Center  09/01/2022  2:40 PM Myrene Bougher, Konrad Dolores, MD [facility] None

## 2022-09-01 ENCOUNTER — Ambulatory Visit (INDEPENDENT_AMBULATORY_CARE_PROVIDER_SITE_OTHER): Payer: Medicare Other | Admitting: Internal Medicine

## 2022-09-01 ENCOUNTER — Encounter: Payer: Self-pay | Admitting: Internal Medicine

## 2022-09-01 VITALS — BP 124/72 | HR 97 | Ht 73.0 in | Wt 222.0 lb

## 2022-09-01 DIAGNOSIS — R7989 Other specified abnormal findings of blood chemistry: Secondary | ICD-10-CM | POA: Diagnosis not present

## 2022-09-01 DIAGNOSIS — E559 Vitamin D deficiency, unspecified: Secondary | ICD-10-CM | POA: Diagnosis not present

## 2022-09-01 DIAGNOSIS — Z87310 Personal history of (healed) osteoporosis fracture: Secondary | ICD-10-CM

## 2022-09-01 NOTE — Patient Instructions (Addendum)
Continue Multivitamin daily  Continue Vitamin D 1000 iu daily  Start Calcium carbonate 600 mg daily     Please have labs drawn at the  [facility] [facility] office  located at [place]. [address] [place]   Between the ours of 8-10 am

## 2022-09-25 ENCOUNTER — Other Ambulatory Visit (INDEPENDENT_AMBULATORY_CARE_PROVIDER_SITE_OTHER): Payer: Medicare Other

## 2022-09-25 DIAGNOSIS — Z87310 Personal history of (healed) osteoporosis fracture: Secondary | ICD-10-CM

## 2022-09-25 DIAGNOSIS — R7989 Other specified abnormal findings of blood chemistry: Secondary | ICD-10-CM

## 2022-09-25 DIAGNOSIS — E559 Vitamin D deficiency, unspecified: Secondary | ICD-10-CM

## 2022-09-25 LAB — PHOSPHORUS: Phosphorus: 4.2 mg/dL (ref 2.3–4.6)

## 2022-09-25 LAB — COMPREHENSIVE METABOLIC PANEL
ALT: 38 U/L (ref 0–53)
AST: 18 U/L (ref 0–37)
Albumin: 4.6 g/dL (ref 3.5–5.2)
Alkaline Phosphatase: 115 U/L (ref 39–117)
BUN: 23 mg/dL (ref 6–23)
CO2: 27 mEq/L (ref 19–32)
Calcium: 9.8 mg/dL (ref 8.4–10.5)
Chloride: 104 mEq/L (ref 96–112)
Creatinine, Ser: 0.94 mg/dL (ref 0.40–1.50)
GFR: 105.53 mL/min (ref >60.00)
Glucose, Bld: 96 mg/dL (ref 70–99)
Potassium: 4 mEq/L (ref 3.5–5.1)
Sodium: 141 mEq/L (ref 135–145)
Total Bilirubin: 0.4 mg/dL (ref 0.2–1.2)
Total Protein: 7.5 g/dL (ref 6.0–8.3)

## 2022-09-25 LAB — LUTEINIZING HORMONE: LH: 7.58 m[IU]/mL (ref 1.50–9.30)

## 2022-09-25 LAB — VITAMIN D 25 HYDROXY (VIT D DEFICIENCY, FRACTURES): VITD: 21.97 ng/mL — ABNORMAL LOW (ref 30.00–100.00)

## 2022-09-25 LAB — MAGNESIUM: Magnesium: 2.1 mg/dL (ref 1.5–2.5)

## 2022-09-29 LAB — TESTOSTERONE, TOTAL, LC/MS/MS: Testosterone, Total, LC-MS-MS: 579 ng/dL (ref 250–1100)

## 2022-09-29 LAB — PARATHYROID HORMONE, INTACT (NO CA): PTH: 24 pg/mL (ref 16–77)

## 2022-09-29 LAB — PROLACTIN: Prolactin: 12.1 ng/mL (ref 2.0–18.0)

## 2022-09-30 ENCOUNTER — Telehealth: Payer: Self-pay | Admitting: Internal Medicine

## 2022-09-30 MED ORDER — ALENDRONATE SODIUM 70 MG PO TABS: 70.0000 mg | ORAL_TABLET | ORAL | 3 refills | Status: DC

## 2022-09-30 NOTE — Telephone Encounter (Signed)
I did speak with patient's father Mr. Gladson on 09/30/2022 at 1515   We discussed that all blood work have come back normal except vitamin D  They have already doubled up on vitamin D amount  I did recommend alendronate once weekly, I did caution against heartburn side effects.  I did briefly discuss the risk of osteonecrosis with long-term intake, but we will consider a holiday within 3-5 years   I did recommend taking alendronate with a full glass of water, half an hour before breakfast, but due to patient situation this is difficult, I did advise the father that he may give it to him with the rest of his medication but if heartburn develops we will consider zoledronic acid      Latest Reference Range & Units 09/25/22 08:04  VITD 30.00 - 100.00 ng/mL 21.97 (L)    Latest Reference Range & Units 09/25/22 08:04  LH 1.50 - 9.30 mIU/mL 7.58  Prolactin 2.0 - 18.0 ng/mL 12.1  Glucose 70 - 99 mg/dL 96  Testosterone, Total, LC-MS-MS 250 - 1,100 ng/dL 579  PTH, Intact 16 - 77 pg/mL 24     Latest Reference Range & Units 09/25/22 08:04  Sodium 135 - 145 mEq/L 141  Potassium 3.5 - 5.1 mEq/L 4.0  Chloride 96 - 112 mEq/L 104  CO2 19 - 32 mEq/L 27  Glucose 70 - 99 mg/dL 96  BUN 6 - 23 mg/dL 23  Creatinine 0.40 - 1.50 mg/dL 0.94  Calcium 8.4 - 10.5 mg/dL 9.8  Phosphorus 2.3 - 4.6 mg/dL 4.2  Magnesium 1.5 - 2.5 mg/dL 2.1  Alkaline Phosphatase 39 - 117 U/L 115  Albumin 3.5 - 5.2 g/dL 4.6  AST 0 - 37 U/L 18  ALT 0 - 53 U/L 38  Total Protein 6.0 - 8.3 g/dL 7.5  Total Bilirubin 0.2 - 1.2 mg/dL 0.4  GFR >60.00 mL/min 105.53

## 2023-02-26 LAB — LAB REPORT - SCANNED: EGFR: 110

## 2023-03-05 LAB — LAB REPORT - SCANNED
Albumin, Urine POC: 3
Creatinine, POC: 117.9 mg/dL
Microalb Creat Ratio: 3

## 2023-03-08 ENCOUNTER — Encounter: Payer: Self-pay | Admitting: Internal Medicine

## 2023-03-08 ENCOUNTER — Ambulatory Visit (INDEPENDENT_AMBULATORY_CARE_PROVIDER_SITE_OTHER): Payer: Medicare Other | Admitting: Internal Medicine

## 2023-03-08 VITALS — BP 118/72 | HR 75 | Ht 73.0 in | Wt 225.0 lb

## 2023-03-08 DIAGNOSIS — Z87310 Personal history of (healed) osteoporosis fracture: Secondary | ICD-10-CM | POA: Diagnosis not present

## 2023-03-08 DIAGNOSIS — E559 Vitamin D deficiency, unspecified: Secondary | ICD-10-CM | POA: Diagnosis not present

## 2023-03-08 DIAGNOSIS — M629 Disorder of muscle, unspecified: Secondary | ICD-10-CM | POA: Diagnosis not present

## 2023-03-08 NOTE — Patient Instructions (Addendum)
Continue Multivitamin daily  Continue Vitamin D 2000 iu daily  Continue  Calcium carbonate 600 mg daily

## 2023-03-08 NOTE — Progress Notes (Unsigned)
Name: Julian Gray  MRN/ DOB: [id number], 04-07-1986    Age/ Sex: 37 y.o., male    PCP: Tisovec, Adelfa Koh, MD   Reason for Endocrinology Evaluation: Osteoporosis     Date of Initial Endocrinology Evaluation: 09/01/2022    HPI: Mr. Julian Gray is a 37 y.o. male with a past medical history of seizure disorder, intellectual disability, autism spectrum. The patient presented for initial endocrinology clinic visit on 09/01/2022 for consultative assistance with his osteoporosis.    Patient has been referred here for further evaluation of history of fragility fracture  Of note, the patient is on the autistic spectrum, with intellectual disability and nonverbal  He presented to the ED 09/03/2021 with a distal tibia/fibular shaft fracture, status post intramedullary nailing of the left tibia 09/04/2021.  He was followed with a left tibia fracture/2023  His falls seem to have happened from a standing position No family history of osteoporosis No prior antiresorptive therapy    DXA scan was normal 07/2022  In reviewing his records, the patient has been noted with low PTH at 13 PG/mL in April 2023 with a normal concomitant serum calcium of 9.9 mg/DL  He also has been noted with low testosterone at 82 NG/DL in April 2023   Denies dental issues  Went through puberty around age 26  Denies family history of osteoporosis   Has hx of myofascial sx a age 80 months and torticollis sx    On his initial visit to our clinic he was  on D3 1000 iu daily , and Centrum vitamin that contains 210 mg of calcium  His  labs 09/2022 showed normal testosterone at 579 NG/DL, normal PTH of 24 PG/mL, prolactin 12.1 NG/mL and LH of 7.58 mIU/mL , normal phosphorus 4.2 mg/DL  He did have low vitamin D 21.97 and was advised to increase vitamin D  SUBJECTIVE:    Today (03/08/23):  Julian Gray is here for follow-up on osteoporosis. He is accompanied by  his father and sister who provided the history    He continues with falls but no fractures  Denies heart burn  Has chronic constipation  No known cramps or spasms    Vitamin D 3 2000 IU daily Centrum vitamin 210 mg calcium Calcium carbonate 600 mg daily Alendronate 70 mg weekly   HISTORY:  Past Medical History:  Past Medical History:  Diagnosis Date   Epilepsy ([facility])    H/O healed fragility fracture 11/26/2021   Mental retardation    Pathological fracture, right tibia, initial encounter for fracture 11/26/2021   Personal history of extracorporeal membrane oxygenation (ECMO)    Past Surgical History:  Past Surgical History:  Procedure Laterality Date   CHEST TUBE INSERTION Right 08/24/2019   Procedure: CHEST TUBE INSERTION;  Surgeon: Corliss Skains, MD;  [facility];  Service: Thoracic;  Laterality: Right;   ESOPHAGOGASTRODUODENOSCOPY Right 08/24/2019   Procedure: ESOPHAGOGASTRODUODENOSCOPY (EGD) percutaneous gastrostomy tube placement, esophageal stent placement, right chest tube placement;  Surgeon: Corliss Skains, MD;  [facility];  Service: Thoracic;  Laterality: Right;   ESOPHAGOGASTRODUODENOSCOPY N/A 09/11/2019   Procedure: ESOPHAGOGASTRODUODENOSCOPY (EGD) esophageal stent removal, esophagram.  will need C-arm, and omniview oral contrast;  Surgeon: Corliss Skains, MD;  [facility];  Service: Thoracic;  Laterality: N/A;   IR REPLC GASTRO/COLONIC TUBE PERCUT W/FLUORO  09/10/2019   REMOVAL OF GASTROSTOMY TUBE N/A 08/24/2019   Procedure: PERCUTANEOUS INSERTION OF GASTROSTOMY TUBE;  Surgeon: Corliss Skains, MD;  [facility];  Service: Thoracic;  Laterality: N/A;   TIBIA IM NAIL INSERTION Left 09/04/2021   Procedure: INTRAMEDULLARY (IM) NAIL TIBIAL;  Surgeon: Roby Lofts, MD;  [facility];  Service: Orthopedics;  Laterality: Left;   TIBIA IM NAIL INSERTION Right 11/25/2021   Procedure: INTRAMEDULLARY (IM) NAIL TIBIAL;  Surgeon: Myrene Galas, MD;  [facility];  Service: Orthopedics;   Laterality: Right;   VIDEO BRONCHOSCOPY  09/11/2019   Procedure: Video Bronchoscopy;  Surgeon: Corliss Skains, MD;  [facility];  Service: Thoracic;;    Social History:  reports that he has never smoked. He has never used smokeless tobacco. He reports that he does not drink alcohol and does not use drugs. Family History: family history is not on file.   HOME MEDICATIONS: Allergies as of 03/08/2023   No Known Allergies      Medication List        Accurate as of March 08, 2023 12:39 PM. If you have any questions, ask your nurse or doctor.          alendronate 70 MG tablet Commonly known as: FOSAMAX Take 1 tablet (70 mg total) by mouth every 7 (seven) days. Take with a full glass of water on an empty stomach.   clozapine 50 MG tablet Commonly known as: CLOZARIL Take 1 tablet (50 mg total) by mouth 3 (three) times daily.   hydrOXYzine 25 MG tablet Commonly known as: ATARAX Take 25 mg by mouth every 6 (six) hours as needed for anxiety.   metoprolol tartrate 25 MG tablet Commonly known as: LOPRESSOR Take 0.5 tablets (12.5 mg total) by mouth 2 (two) times daily.   multivitamin with minerals Tabs tablet Take 1 tablet by mouth daily.   oxcarbazepine 600 MG tablet Commonly known as: TRILEPTAL Take 1 tablet (600 mg total) by mouth 2 (two) times daily.   VITAMIN D PO Take 1 capsule by mouth daily.          REVIEW OF SYSTEMS: A comprehensive ROS was conducted with the patient and is negative except as per HPI    OBJECTIVE:  VS: There were no vitals taken for this visit.   Wt Readings from Last 3 Encounters:  09/01/22 222 lb (100.7 kg)  11/25/21 225 lb (102.1 kg)  09/04/21 230 lb (104.3 kg)     EXAM: General: Pt appears well and is in NAD, walker present  Neck: General: Supple without adenopathy. Thyroid: Thyroid size normal.  No goiter or nodules appreciated.   Lungs: Clear with good BS bilat   Heart: Auscultation: RRR.  Genital: Deferred   Extremities:  BL LE: No pretibial edema   Mental Status: Judgment, insight: Intact Orientation: Oriented to time, place, and person Mood and affect: No depression, anxiety, or agitation     DATA REVIEWED:      DXA 07/06/2022  AP Spine L1-L4 (L2,L3) 07/06/2022 34.6 -0.9 1.087 g/cm2   DualFemur Total Left 07/06/2022 34.6 -0.3 1.033 g/cm2   DualFemur Total Mean 07/06/2022 34.6 -0.3 1.043 g/cm2   Left Forearm Radius 33% 07/06/2022 34.6 -0.5 0.945 g/cm2   ASSESSMENT: With a Z-score of -0.9, this patient's BMD is within the expected range for age.    ASSESSMENT/PLAN/RECOMMENDATIONS:    2.Hx fragility fracture:  -Unclear cause at this time -DXA with normal Z-score -Will check testosterone, magnesium, phosphorus, PTH and calcium -Emphasized the importance of optimizing calcium and vitamin D intake  Continue multivitamin daily Continue vitamin D 1000 IU daily Start calcium  carbonate 600 mg daily   3. Vitamin D Insufficiency :  -History of low vitamin D - will recheck  Follow-up in 6 months  Signed electronically by: Lyndle Herrlich, MD  [facility] Endocrinology  [facility] [address], [address] [place], [place] [address] Phone: [phone] FAX: [phone]   CC: Gaspar Garbe, MD [address] [place] [address] Phone: [phone] Fax: [phone]   Return to Endocrinology clinic as below: Future Appointments  Date Time Provider Department Center  03/08/2023  2:40 PM , Konrad Dolores, MD [facility] None

## 2023-03-09 MED ORDER — ALENDRONATE SODIUM 70 MG PO TABS: 70.0000 mg | ORAL_TABLET | ORAL | 3 refills | Status: DC

## 2024-01-26 ENCOUNTER — Encounter: Payer: Self-pay | Admitting: Family Medicine

## 2024-01-26 ENCOUNTER — Ambulatory Visit (INDEPENDENT_AMBULATORY_CARE_PROVIDER_SITE_OTHER): Admitting: Family Medicine

## 2024-01-26 VITALS — BP 132/82 | HR 77 | Temp 98.1°F | Resp 18 | Ht 73.0 in | Wt 216.0 lb

## 2024-01-26 DIAGNOSIS — I10 Essential (primary) hypertension: Secondary | ICD-10-CM | POA: Diagnosis not present

## 2024-01-26 DIAGNOSIS — F840 Autistic disorder: Secondary | ICD-10-CM

## 2024-01-26 DIAGNOSIS — Z87310 Personal history of (healed) osteoporosis fracture: Secondary | ICD-10-CM | POA: Diagnosis not present

## 2024-01-26 DIAGNOSIS — F79 Unspecified intellectual disabilities: Secondary | ICD-10-CM

## 2024-01-26 NOTE — Patient Instructions (Signed)
Welcome to [facility]! It was a pleasure meeting you today. ? ?As discussed, Please schedule a 2 month follow up visit today. ? ?PLEASE NOTE: ? ?If you had any LAB tests please let us know if you have not heard back within a few days. You may see your results on MyChart before we have a chance to review them but we will give you a call once they are reviewed by [place]. If we ordered any REFERRALS today, please let us know if you have not heard from their office within the next week.  ?Let us know through MyChart if you are needing REFILLS, or have your pharmacy send [place] the request. You can also use MyChart to communicate with me or any office staff. ? ?Please try these tips to maintain a healthy lifestyle: ? ?Eat most of your calories during the day when you are active. Eliminate processed foods including packaged sweets (pies, cakes, cookies), reduce intake of potatoes, white bread, white pasta, and white rice. Look for whole grain options, oat flour or almond flour. ? ?Each meal should contain half fruits/vegetables, one quarter protein, and one quarter carbs (no bigger than a computer mouse). ? ?Cut down on sweet beverages. This includes juice, soda, and sweet tea. Also watch fruit intake, though this is a healthier sweet option, it still contains natural sugar! Limit to 3 servings daily. ? ?Drink at least 1 glass of water with each meal and aim for at least 8 glasses per day ? ?Exercise at least 150 minutes every week.   ?

## 2024-01-26 NOTE — Progress Notes (Signed)
 New Patient Office Visit  Subjective:  Patient ID: Julian Gray, male    DOB: Jun 04, 1986  Age: 38 y.o. MRN: [id number]  CC:  Chief Complaint  Patient presents with   Establish Care    Initial visit to establish care with new pcp     HPI Julian Gray presents for new pt-here w/Dad and sister Discussed the use of AI scribe software for clinical note transcription with the patient, who gave verbal consent to proceed.  History of Present Illness Julian Gray is a 38 year old male with developmental delay and hypertension who presents for establishment of care. He is accompanied by his sister Julian Gray and Dad, who is his primary caregiver.  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 This has led to developmental delays, requiring significant support in daily activities. He recognizes family members and certain symbols but is not capable of independent living or working. Minimally verbal  He has a history of hypertension and is currently taking metoprolol  25mg , half a tablet twice a day. His blood pressure at home has been running under 120/70 mmHg, according to family report. No dizziness, chest pain, or shortness of breath. He occasionally experiences constipation, managed with daily Miralax , and efforts are made to increase his fluid intake, including diluted Gatorade.  He has experienced fractures in both lower legs, occurring within two months of each other, leading to the placement of rods in each leg. An endocrinological evaluation showed no deficiencies. He uses a walker and wheelchair as needed. Family observed that his mobility improved after his oxcarbazepine  dosage was reduced, which he takes for mood stabilization.  He underwent emergency surgery for an esophageal laceration caused by swallowing a large piece of apple, which led to choking and bleeding.  He recovered well post-surgery.  He has a history of violent episodes, which have been significantly reduced over the past five to six years with medication management, including oxcarbazepine . He is under psychiatric care for mood stabilization.  He is socially active with the support of his sister, participating in activities such as dance classes, pickleball, and attending sports events. He exercises regularly at the [facility] and is taken on outings to maintain physical activity.     Current Outpatient Medications:    alendronate  (FOSAMAX ) 70 MG tablet, Take 1 tablet (70 mg total) by mouth every 7 (seven) days. Take with a full glass of water on an empty stomach., Disp: 12 tablet, Rfl: 3   Calcium-Magnesium-Vitamin D  (CITRACAL CALCIUM +D3 PO), Take 650 mg by mouth daily., Disp: , Rfl:    Cholecalciferol  (D3) 50 MCG (2000 UT) TABS, Take by mouth daily., Disp: , Rfl:    clozapine  (CLOZARIL ) 50 MG tablet, Take 1 tablet (50 mg total) by mouth 3 (three) times daily., Disp:  , Rfl:    hydrOXYzine  (ATARAX ) 25 MG tablet, Take 25 mg by mouth every 6 (six) hours as needed for anxiety., Disp: , Rfl:    metoprolol  tartrate (LOPRESSOR ) 25 MG tablet, Take 0.5 tablets (12.5 mg total) by mouth 2 (two) times daily., Disp: 30 tablet, Rfl: 2   Multiple Vitamins-Minerals (CENTRUM MEN PO), Take 1 tablet by mouth daily., Disp: , Rfl:    oxcarbazepine  (TRILEPTAL ) 600 MG tablet, Take 1 tablet (600 mg total) by mouth 2 (two) times daily. (Patient taking differently: Take 600 mg by mouth 2 (two) times daily.),  Disp:  , Rfl:    Polyethylene Glycol 3350  (MIRALAX  PO), Take 1 Capful by mouth daily., Disp: , Rfl:   Past Medical History:  Diagnosis Date   Anxiety    Epilepsy (HCC)    H/O healed fragility fracture 11/26/2021   Hypertension    Mental retardation    Pathological fracture, right tibia, initial encounter for fracture 11/26/2021   Personal history of extracorporeal membrane oxygenation (ECMO)     Past  Surgical History:  Procedure Laterality Date   CHEST TUBE INSERTION Right 08/24/2019   Procedure: CHEST TUBE INSERTION;  Surgeon: Shyrl Linnie KIDD, MD;  [facility];  Service: Thoracic;  Laterality: Right;   ESOPHAGOGASTRODUODENOSCOPY Right 08/24/2019   Procedure: ESOPHAGOGASTRODUODENOSCOPY (EGD) percutaneous gastrostomy tube placement, esophageal stent placement, right chest tube placement;  Surgeon: Shyrl Linnie KIDD, MD;  Location: [facility];  Service: Thoracic;  Laterality: Right;   ESOPHAGOGASTRODUODENOSCOPY N/A 09/11/2019   Procedure: ESOPHAGOGASTRODUODENOSCOPY (EGD) esophageal stent removal, esophagram.  will need C-arm, and omniview oral contrast;  Surgeon: Shyrl Linnie KIDD, MD;  Location: [facility];  Service: Thoracic;  Laterality: N/A;   IR REPLC GASTRO/COLONIC TUBE PERCUT W/FLUORO  09/10/2019   REMOVAL OF GASTROSTOMY TUBE N/A 08/24/2019   Procedure: PERCUTANEOUS INSERTION OF GASTROSTOMY TUBE;  Surgeon: Shyrl Linnie KIDD, MD;  [facility];  Service: Thoracic;  Laterality: N/A;   TIBIA IM NAIL INSERTION Left 09/04/2021   Procedure: INTRAMEDULLARY (IM) NAIL TIBIAL;  Surgeon: Kendal Franky SQUIBB, MD;  [facility];  Service: Orthopedics;  Laterality: Left;   TIBIA IM NAIL INSERTION Right 11/25/2021   Procedure: INTRAMEDULLARY (IM) NAIL TIBIAL;  Surgeon: Celena Sharper, MD;  [facility];  Service: Orthopedics;  Laterality: Right;   VIDEO BRONCHOSCOPY  09/11/2019   Procedure: Video Bronchoscopy;  Surgeon: Shyrl Linnie KIDD, MD;  [facility];  Service: Thoracic;;    Family History  Problem Relation Age of Onset   Hearing loss Mother    Hyperlipidemia Mother    Heart disease Mother    Depression Mother    Heart disease Father    Atrial fibrillation Father    Diabetes Sister    Cancer Maternal Grandfather    Stroke Paternal Grandmother    Hearing loss Paternal Grandfather    Heart attack Paternal Grandfather    Ataxia Neg Hx    Chorea Neg Hx    Dementia Neg  Hx    Mental retardation Neg Hx    Migraines Neg Hx    Multiple sclerosis Neg Hx    Neurofibromatosis Neg Hx    Neuropathy Neg Hx    Parkinsonism Neg Hx    Seizures Neg Hx     Social History   Socioeconomic History   Marital status: Single    Spouse name: Not on file   Number of children: Not on file   Years of education: Not on file   Highest education level: Not on file  Occupational History   Not on file  Tobacco Use   Smoking status: Never   Smokeless tobacco: Never  Vaping Use   Vaping status: Never Used  Substance and Sexual Activity   Alcohol use: No   Drug use: No   Sexual activity: Never  Other Topics Concern   Not on file  Social History Narrative   3 sibs   Social Drivers of Corporate investment banker Strain: Not on file  Food Insecurity: Not on file  Transportation Needs: Not on file  Physical Activity: Not on  file  Stress: Not on file  Social Connections: Not on file  Intimate Partner Violence: Not on file    ROS  ROS: Gen: no fever, chills  Skin: no rash, itching ENT: no ear pain, ear drainage, nasal congestion, rhinorrhea, sinus pressure, sore throat Eyes: no blurry vision, double vision Resp: no cough, wheeze,SOB CV: no CP, palpitations, LE edema,  GI: no heartburn, n/v/d/c, abd pain GU: no dysuria, urgency, frequency, hematuria MSK: no joint pain, myalgias, back pain Neuro: no dizziness, headache, weakness, vertigo Psych:unable to illicit  Objective:   Today's Vitals: BP 132/82 (BP Location: Right Arm, Patient Position: Sitting, Cuff Size: Large)   Pulse 77   Temp 98.1 F (36.7 C) (Temporal)   Resp 18   Ht 6' 1 (1.854 m)   Wt 216 lb (98 kg)   SpO2 97%   BMI 28.50 kg/m   Physical Exam  Gen: WDWN NAD.  Cooperative.  Follows directions mostly HEENT: NCAT, conjunctiva not injected, sclera nonicteric NECK:  supple, no thyromegaly, no nodes, no carotid bruits CARDIAC: RRR, S1S2+, no murmur. DP 2+B LUNGS: CTAB. No  wheezes ABDOMEN:  BS+, soft, NTND, No HSM, no masses EXT:  no edema MSK: no gross abnormalities.  NEURO: A&O x3.  CN II-XII intact.  PSYCH: normal mood. Good eye contact   Assessment & Plan:  Primary hypertension  Autism spectrum  H/O healed fragility fracture  Intellectual disability  Moderate perinatal anoxic-ischemic brain injury  Assessment and Plan Assessment & Plan Epilepsy (suspected)   Uncertainty remains regarding epilepsy diagnosis. Rockey, an ECMO baby, experienced significant oxygen deprivation due to Group B strep meningitis shortly after birth, potentially contributing to neurological issues. He is currently on oxcarbazepine , reduced to half the original dose due to improved mobility and stability observed during a period of medication unavailability. Continue oxcarbazepine  at the current reduced dose.  Developmental and Behavioral Concerns   Maks has developmental and behavioral challenges, including past violent episodes managed with psychiatric care. His current medication regimen stabilizes mood and reduces episode frequency. He engages in activities with his sister, his primary caregiver. The psychiatric team has been instrumental in managing his condition through adolescence and into adulthood. Continue current psychiatric care and medication regimen. Encourage participation in social and physical activities.  Hypertension   Hypertension is managed with metoprolol . Home blood pressure readings are generally under 120/70 mmHg; today's reading was 132/82 mmHg, likely due to a new environment. No dizziness, chest pain, or shortness of breath reported. Continue metoprolol  half a tablet twice a day. Monitor blood pressure at home.  Constipation   Constipation is managed with daily Miralax . He is encouraged to increase fluid intake, particularly water, and maintain physical activity. Watered-down Gatorade is used to increase fluid intake. Continue daily Miralax . Encourage  increased fluid intake, including diluted Gatorade. Promote regular physical activity.  Tibial Fractures   Bilateral tibial fractures were treated with rods. Endocrinologist evaluation found no deficiencies. Fractures are likely accidental with no underlying bone health issues identified.  Esophageal Laceration   An esophageal laceration from swallowing a large piece of apple required emergency surgery. He recovered well with no ongoing issues.  General Health Maintenance   Regular physical activity and overall health monitoring are advised. Last blood work was 6-7 months ago. A follow-up is planned to establish care with a new provider. An appointment is scheduled in a couple of months for an annual physical and blood work to avoid overstimulation today.    Follow-up: Return in about 2 months (  around 03/27/2024) for annual physical.   Jenkins CHRISTELLA Carrel, MD

## 2024-02-05 ENCOUNTER — Other Ambulatory Visit: Payer: Self-pay | Admitting: Internal Medicine

## 2024-03-07 ENCOUNTER — Encounter: Payer: Self-pay | Admitting: Internal Medicine

## 2024-03-07 ENCOUNTER — Ambulatory Visit: Payer: Medicare Other | Admitting: Internal Medicine

## 2024-03-07 VITALS — BP 120/70 | HR 85 | Ht 73.0 in | Wt 217.0 lb

## 2024-03-07 DIAGNOSIS — Z87310 Personal history of (healed) osteoporosis fracture: Secondary | ICD-10-CM | POA: Diagnosis not present

## 2024-03-07 MED ORDER — ALENDRONATE SODIUM 70 MG PO TABS: 70.0000 mg | ORAL_TABLET | ORAL | 3 refills | Status: AC

## 2024-03-07 NOTE — Progress Notes (Signed)
 Name: Julian Gray  MRN/ DOB: [id number], 08/07/1985    Age/ Sex: 38 y.o., male    PCP: Wendolyn Jenkins Jansky, MD   Reason for Endocrinology Evaluation: Osteoporosis     Date of Initial Endocrinology Evaluation: 09/01/2022    HPI: Mr. Julian Gray is a 38 y.o. male with a past medical history of seizure disorder, intellectual disability, autism spectrum. The patient presented for initial endocrinology clinic visit on 09/01/2022 for consultative assistance with his osteoporosis.    Patient has been referred here for further evaluation of history of fragility fracture  Of note, the patient is on the autistic spectrum, with intellectual disability and nonverbal  He presented to the ED 09/03/2021 with a distal tibia/fibular shaft fracture, status post intramedullary nailing of the left tibia 09/04/2021.  He was followed with a left tibia fracture/2023  His falls seem to have happened from a standing position No family history of osteoporosis No prior antiresorptive therapy    DXA scan was normal 07/2022  In reviewing his records, the patient has been noted with low PTH at 13 PG/mL in April 2023 with a normal concomitant serum calcium of 9.9 mg/DL  He also has been noted with low testosterone  at 82 NG/DL in April 2023   Denies dental issues  Went through puberty around age 38  Denies family history of osteoporosis   Has hx of myofascial sx a age 85 months and torticollis sx    On his initial visit to our clinic he was  on D3 1000 iu daily , and Centrum vitamin that contains 210 mg of calcium  His  labs 09/2022 showed normal testosterone  at 579 NG/DL, normal PTH of 24 PG/mL, prolactin 12.1 NG/mL and LH of 7.58 mIU/mL , normal phosphorus 4.2 mg/DL  He did have low vitamin D  21.97 and was advised to increase vitamin D   SUBJECTIVE:    Today (03/07/24):  Julian Gray is here for follow-up on osteoporosis. He is accompanied by  his mother and sister who provided the history     He follows with psychiatry for autistic disorder and severe intellectual disability  Falls have not been as frequent   Denies heart burn  NO vomiting  Continues with  chronic constipation - on daily miralax     Vitamin D3 2000 IU daily Centrum vitamin 210 mg calcium Calcium 400 mg daily Alendronate  70 mg weekly   HISTORY:  Past Medical History:  Past Medical History:  Diagnosis Date   Anxiety    Epilepsy (HCC)    H/O healed fragility fracture 11/26/2021   Hypertension    Mental retardation    Pathological fracture, right tibia, initial encounter for fracture 11/26/2021   Personal history of extracorporeal membrane oxygenation (ECMO)    Past Surgical History:  Past Surgical History:  Procedure Laterality Date   CHEST TUBE INSERTION Right 08/24/2019   Procedure: CHEST TUBE INSERTION;  Surgeon: Shyrl Linnie KIDD, MD;  [facility];  Service: Thoracic;  Laterality: Right;   ESOPHAGOGASTRODUODENOSCOPY Right 08/24/2019   Procedure: ESOPHAGOGASTRODUODENOSCOPY (EGD) percutaneous gastrostomy tube placement, esophageal stent placement, right chest tube placement;  Surgeon: Shyrl Linnie KIDD, MD;  [facility]: [facility];  Service: Thoracic;  Laterality: Right;   ESOPHAGOGASTRODUODENOSCOPY N/A 09/11/2019   Procedure: ESOPHAGOGASTRODUODENOSCOPY (EGD) esophageal stent removal, esophagram.  will need C-arm, and omniview oral contrast;  Surgeon: Shyrl Linnie KIDD, MD;  [facility]: [facility];  Service: Thoracic;  Laterality: N/A;   IR REPLC GASTRO/COLONIC TUBE PERCUT LOVEY  09/10/2019  REMOVAL OF GASTROSTOMY TUBE N/A 08/24/2019   Procedure: PERCUTANEOUS INSERTION OF GASTROSTOMY TUBE;  Surgeon: Shyrl Linnie KIDD, MD;  [facility]: [facility];  Service: Thoracic;  Laterality: N/A;   TIBIA IM NAIL INSERTION Left 09/04/2021   Procedure: INTRAMEDULLARY (IM) NAIL TIBIAL;  Surgeon: Kendal Franky SQUIBB, MD;  [facility]: [facility];  Service: Orthopedics;  Laterality: Left;   TIBIA IM NAIL INSERTION Right  11/25/2021   Procedure: INTRAMEDULLARY (IM) NAIL TIBIAL;  Surgeon: Celena Sharper, MD;  [facility]: [facility];  Service: Orthopedics;  Laterality: Right;   VIDEO BRONCHOSCOPY  09/11/2019   Procedure: Video Bronchoscopy;  Surgeon: Shyrl Linnie KIDD, MD;  Location: [facility];  Service: Thoracic;;    Social History:  reports that he has never smoked. He has never used smokeless tobacco. He reports that he does not drink alcohol and does not use drugs. Family History: family history includes Atrial fibrillation in his father; Cancer in his maternal grandfather; Depression in his mother; Diabetes in his sister; Hearing loss in his mother and paternal grandfather; Heart attack in his paternal grandfather; Heart disease in his father and mother; Hyperlipidemia in his mother; Stroke in his paternal grandmother.   HOME MEDICATIONS: Allergies as of 03/07/2024   No Known Allergies      Medication List        Accurate as of March 07, 2024  2:25 PM. If you have any questions, ask your nurse or doctor.          alendronate  70 MG tablet Commonly known as: FOSAMAX  TAKE 1 TABLET (70 MG TOTAL) BY MOUTH EVERY 7 DAYS WITH FULL GLASS WATER ON EMPTY STOMACH   CENTRUM MEN PO Take 1 tablet by mouth daily.   CITRACAL CALCIUM +D3 PO Take 650 mg by mouth daily.   clozapine  50 MG tablet Commonly known as: CLOZARIL  Take 1 tablet (50 mg total) by mouth 3 (three) times daily.   D3 50 MCG (2000 UT) Tabs Generic drug: Cholecalciferol  Take by mouth daily.   hydrOXYzine  25 MG tablet Commonly known as: ATARAX  Take 25 mg by mouth every 6 (six) hours as needed for anxiety.   metoprolol  tartrate 25 MG tablet Commonly known as: LOPRESSOR  Take 0.5 tablets (12.5 mg total) by mouth 2 (two) times daily.   MIRALAX  PO Take 1 Capful by mouth daily.   oxcarbazepine  600 MG tablet Commonly known as: TRILEPTAL  Take 1 tablet (600 mg total) by mouth 2 (two) times daily.          REVIEW OF SYSTEMS: A comprehensive  ROS was conducted with the patient and is negative except as per HPI    OBJECTIVE:  VS: There were no vitals taken for this visit.    Wt Readings from Last 3 Encounters:  01/26/24 216 lb (98 kg)  03/08/23 225 lb (102.1 kg)  09/01/22 222 lb (100.7 kg)     EXAM: General: Pt appears well and is in NAD, walker present  Neck: General: Supple without adenopathy. Thyroid : Thyroid  size normal.  No goiter or nodules appreciated.   Lungs: Clear with good BS bilat   Heart: Auscultation: RRR.  Genital: Deferred  Extremities:  BL LE: No pretibial edema   Mental Status: Judgment, insight: Intact Orientation: Oriented to time, place, and person Mood and affect: No depression, anxiety, or agitation     DATA REVIEWED:  Latest Reference Range & Units 03/08/23 15:04  Sodium 135 - 145 mEq/L 141  Potassium 3.5 - 5.1 mEq/L 4.2  Chloride 96 - 112 mEq/L 104  CO2 19 - 32 mEq/L 24  Glucose 70 - 99 mg/dL 91  BUN 6 - 23 mg/dL 19  Creatinine 9.59 - 8.49 mg/dL 9.04  Calcium 8.4 - 89.4 mg/dL 9.8  Phosphorus 2.3 - 4.6 mg/dL 4.1  Magnesium 1.5 - 2.5 mg/dL 2.0  Alkaline Phosphatase 39 - 117 U/L 87  Albumin 3.5 - 5.2 g/dL 4.4  AST 0 - 37 U/L 14  ALT 0 - 53 U/L 19  Total Protein 6.0 - 8.3 g/dL 6.8  Total Bilirubin 0.2 - 1.2 mg/dL 0.3  GFR >39.99 mL/min 103.87    Latest Reference Range & Units 03/08/23 15:04  VITD 30.00 - 100.00 ng/mL 36.63    Latest Reference Range & Units 03/08/23 15:04  TSH 0.35 - 5.50 uIU/mL 1.86     DXA 07/06/2022  AP Spine L1-L4 (L2,L3) 07/06/2022 34.6 -0.9 1.087 g/cm2   DualFemur Total Left 07/06/2022 34.6 -0.3 1.033 g/cm2   DualFemur Total Mean 07/06/2022 34.6 -0.3 1.043 g/cm2   Left Forearm Radius 33% 07/06/2022 34.6 -0.5 0.945 g/cm2   ASSESSMENT: With a Z-score of -0.9, this patient's BMD is within the expected range for age.    ASSESSMENT/PLAN/RECOMMENDATIONS:    2.Hx fragility fracture:  -Unclear cause  -DXA with normal Z-score -Testosterone ,  magnesium, phosphorus, PTH and calcium have all come back normal in 2024 -Emphasized the importance of optimizing calcium and vitamin D  intake - Patient is scheduled for physical in a couple weeks through his PCPs office, and will have labs done   Medication  Continue multivitamin daily Continue calcium 800 mg daily Continue alendronate  70 mg weekly Continue vitamin D3 2000 IU daily  Follow-up in 1 yr   Signed electronically by: Stefano Redgie Butts, MD  [facility] Endocrinology  [facility] [address] [place] [place], [place] [address] Phone: [phone] FAX: [phone]   CC: Wendolyn Jenkins Jansky, MD [address] [place] [address] Phone: [phone] Fax: [phone]   Return to Endocrinology clinic as below: Future Appointments  Date Time Provider Department Center  03/27/2024  3:30 PM Wendolyn Jenkins Jansky, MD [facility]

## 2024-03-07 NOTE — Patient Instructions (Addendum)
 Continue Multivitamin daily  Continue Vitamin D  2000 iu daily  Continue  Calcium 800 mg daily  Continue Alendronate  70 mg once weekly

## 2024-03-27 ENCOUNTER — Encounter: Payer: Self-pay | Admitting: Family Medicine

## 2024-03-27 ENCOUNTER — Ambulatory Visit (INDEPENDENT_AMBULATORY_CARE_PROVIDER_SITE_OTHER): Admitting: Family Medicine

## 2024-03-27 VITALS — BP 124/82 | HR 94 | Temp 97.7°F | Resp 18 | Ht 73.0 in

## 2024-03-27 DIAGNOSIS — Z1159 Encounter for screening for other viral diseases: Secondary | ICD-10-CM

## 2024-03-27 DIAGNOSIS — Z79899 Other long term (current) drug therapy: Secondary | ICD-10-CM | POA: Diagnosis not present

## 2024-03-27 DIAGNOSIS — I10 Essential (primary) hypertension: Secondary | ICD-10-CM

## 2024-03-27 DIAGNOSIS — F840 Autistic disorder: Secondary | ICD-10-CM | POA: Diagnosis not present

## 2024-03-27 DIAGNOSIS — Z87310 Personal history of (healed) osteoporosis fracture: Secondary | ICD-10-CM | POA: Diagnosis not present

## 2024-03-27 NOTE — Patient Instructions (Signed)

## 2024-03-27 NOTE — Progress Notes (Signed)
 Subjective:     Patient ID: Julian Gray, male    DOB: 01/05/1986, 38 y.o.   MRN: [id number]  Chief Complaint  Patient presents with   Annual Exam    CPE     HPI Discussed the use of AI scribe software for clinical note transcription with the patient, who gave verbal consent to proceed.  History of Present Illness Julian Gray is a 38 year old male who presents for an annual physical exam. He is accompanied by his parents.  He has a history of hypertension, managed with metoprolol  at a dose of half a tablet twice a day. His blood pressure readings at home are approximately 120/82 mmHg. No chest pain, shortness of breath, or swelling in his legs.  H/o fx-He is on alendronate .  His medication regimen also includes Clozaril  and hydroxyzine , and he reports stable moods with no recent changes. Sees Psych  He experiences occasional spasms, and his seizures are controlled with Trileptal .  He has a history of constipation and takes Miralax  almost every day to prevent it. No recent episodes of vomiting, diarrhea.  He maintains an active lifestyle, walking daily and attending the [facility]. He is supported by Carlyon, who takes him out in the community every day. He enjoys watching TV and requires some encouragement to leave the house, often motivated by a milkshake.  He has a history of multiple surgeries, including esophageal surgery and surgeries for broken legs, during which he received blood transfusions. He received a pneumonia vaccine in 2023.    Health Maintenance Due  Topic Date Due   Medicare Annual Wellness (AWV)  Never done   Hepatitis C Screening  Never done   INFLUENZA VACCINE  03/03/2024    Past Medical History:  Diagnosis Date   Anxiety    Epilepsy (HCC)    H/O healed fragility fracture 11/26/2021   Hypertension    Mental retardation    Pathological fracture, right tibia, initial encounter for fracture 11/26/2021   Personal history of extracorporeal membrane  oxygenation (ECMO)     Past Surgical History:  Procedure Laterality Date   CHEST TUBE INSERTION Right 08/24/2019   Procedure: CHEST TUBE INSERTION;  Surgeon: Shyrl Linnie KIDD, MD;  Location: [facility] OR;  Service: Thoracic;  Laterality: Right;   ESOPHAGOGASTRODUODENOSCOPY Right 08/24/2019   Procedure: ESOPHAGOGASTRODUODENOSCOPY (EGD) percutaneous gastrostomy tube placement, esophageal stent placement, right chest tube placement;  Surgeon: Shyrl Linnie KIDD, MD;  Location: [facility] OR;  Service: Thoracic;  Laterality: Right;   ESOPHAGOGASTRODUODENOSCOPY N/A 09/11/2019   Procedure: ESOPHAGOGASTRODUODENOSCOPY (EGD) esophageal stent removal, esophagram.  will need C-arm, and omniview oral contrast;  Surgeon: Shyrl Linnie KIDD, MD;  Location: [facility] OR;  Service: Thoracic;  Laterality: N/A;   IR REPLC GASTRO/COLONIC TUBE PERCUT W/FLUORO  09/10/2019   REMOVAL OF GASTROSTOMY TUBE N/A 08/24/2019   Procedure: PERCUTANEOUS INSERTION OF GASTROSTOMY TUBE;  Surgeon: Shyrl Linnie KIDD, MD;  Location: [facility] OR;  Service: Thoracic;  Laterality: N/A;   TIBIA IM NAIL INSERTION Left 09/04/2021   Procedure: INTRAMEDULLARY (IM) NAIL TIBIAL;  Surgeon: Kendal Franky SQUIBB, MD;  Location: [facility] OR;  Service: Orthopedics;  Laterality: Left;   TIBIA IM NAIL INSERTION Right 11/25/2021   Procedure: INTRAMEDULLARY (IM) NAIL TIBIAL;  Surgeon: Celena Sharper, MD;  Location: [facility] OR;  Service: Orthopedics;  Laterality: Right;   VIDEO BRONCHOSCOPY  09/11/2019   Procedure: Video Bronchoscopy;  Surgeon: Shyrl Linnie KIDD, MD;  Location: [facility] OR;  Service: Thoracic;;     Current Outpatient Medications:  alendronate  (FOSAMAX ) 70 MG tablet, Take 1 tablet (70 mg total) by mouth once a week. Take with a full glass of water on an empty stomach., Disp: 13 tablet, Rfl: 3   Calcium-Magnesium-Vitamin D  (CITRACAL CALCIUM +D3 PO), Take 650 mg by mouth daily. (Patient taking differently: Take 650 mg by mouth daily. Calcium petites 400mg   2 TABS),  Disp: , Rfl:    Cholecalciferol  (D3) 50 MCG (2000 UT) TABS, Take by mouth daily., Disp: , Rfl:    clozapine  (CLOZARIL ) 50 MG tablet, Take 1 tablet (50 mg total) by mouth 3 (three) times daily., Disp:  , Rfl:    hydrOXYzine  (ATARAX ) 25 MG tablet, Take 25 mg by mouth every 6 (six) hours as needed for anxiety., Disp: , Rfl:    metoprolol  tartrate (LOPRESSOR ) 25 MG tablet, Take 0.5 tablets (12.5 mg total) by mouth 2 (two) times daily., Disp: 30 tablet, Rfl: 2   Multiple Vitamins-Minerals (CENTRUM MEN PO), Take 1 tablet by mouth daily., Disp: , Rfl:    oxcarbazepine  (TRILEPTAL ) 600 MG tablet, Take 1 tablet (600 mg total) by mouth 2 (two) times daily. (Patient taking differently: Take 600 mg by mouth 2 (two) times daily.), Disp:  , Rfl:    Polyethylene Glycol 3350  (MIRALAX  PO), Take 1 Capful by mouth daily., Disp: , Rfl:   No Known Allergies ROS neg/noncontributory except as noted HPI/below      Objective:     BP 124/82   Pulse 94   Temp 97.7 F (36.5 C) (Temporal)   Resp 18   Ht 6' 1 (1.854 m)   SpO2 97%   BMI 28.63 kg/m  Wt Readings from Last 3 Encounters:  03/07/24 217 lb (98.4 kg)  01/26/24 216 lb (98 kg)  03/08/23 225 lb (102.1 kg)    Physical Exam   Gen: WDWN NAD HEENT: NCAT, conjunctiva not injected, sclera nonicteric TM WNL B w/some wax, OP moist, no exudates  NECK:  supple, no thyromegaly, no nodes, no carotid bruits CARDIAC: RRR, S1S2+, no murmur. DP 2+B LUNGS: CTAB. No wheezes ABDOMEN:  BS+, soft, NTND, No HSM, no masses EXT:  no edema MSK: no gross abnormalities. In w/c today NEURO: A&O x3.  CN II-XII intact.  PSYCH: normal mood. Good eye contact. Somewhat verbal. Very cooperative     Assessment & Plan:  Primary hypertension -     CBC with Differential/Platelet -     Comprehensive metabolic panel with GFR -     TSH  H/O healed fragility fracture  Autism spectrum  Screening for viral disease -     Hepatitis C antibody  High risk medication use -      CBC with Differential/Platelet -     Comprehensive metabolic panel with GFR -     Hemoglobin A1c -     TSH -     Hepatitis C antibody -     Lipid panel  Moderate perinatal anoxic-ischemic brain injury  Assessment and Plan Assessment & Plan Hypertension   Hypertension is well-controlled with metoprolol , with readings around 120/82 mmHg. No dizziness, chest pain, or shortness of breath. Continue metoprolol  25 mg, half tablet twice daily.  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 Occasional spasms occur but no major seizures.  Constipation   Chronic constipation is effectively managed with daily Miralax . No current issues. Continue daily Miralax  as needed.  Mood disorder /Autism Mood disorder remains stable with current medication. No significant  mood swings. Continue current medication regimen.managed by psych.   Osteoporosis (prophylactic treatment)   Prophylactic treatment for osteoporosis with alendronate  is ongoing. No diagnosis of osteoporosis. Continue alendronate  as prescribed.    Return in about 1 year (around 03/27/2025) for chronic follow-up.  Jenkins CHRISTELLA Carrel, MD

## 2024-03-28 LAB — COMPREHENSIVE METABOLIC PANEL WITH GFR
ALT: 17 U/L (ref 0–53)
AST: 16 U/L (ref 0–37)
Albumin: 4.5 g/dL (ref 3.5–5.2)
Alkaline Phosphatase: 82 U/L (ref 39–117)
BUN: 20 mg/dL (ref 6–23)
CO2: 25 meq/L (ref 19–32)
Calcium: 9.5 mg/dL (ref 8.4–10.5)
Chloride: 104 meq/L (ref 96–112)
Creatinine, Ser: 0.89 mg/dL (ref 0.40–1.50)
GFR: 110.38 mL/min (ref >60.00)
Glucose, Bld: 94 mg/dL (ref 70–99)
Potassium: 4 meq/L (ref 3.5–5.1)
Sodium: 141 meq/L (ref 135–145)
Total Bilirubin: 0.3 mg/dL (ref 0.2–1.2)
Total Protein: 7.4 g/dL (ref 6.0–8.3)

## 2024-03-28 LAB — CBC WITH DIFFERENTIAL/PLATELET
Basophils Absolute: 0 K/uL (ref 0.0–0.1)
Basophils Relative: 0.8 % (ref 0.0–3.0)
Eosinophils Absolute: 0.4 K/uL (ref 0.0–0.7)
Eosinophils Relative: 8.5 % — ABNORMAL HIGH (ref 0.0–5.0)
HCT: 43.9 % (ref 39.0–52.0)
Hemoglobin: 14.7 g/dL (ref 13.0–17.0)
Lymphocytes Relative: 36.9 % (ref 12.0–46.0)
Lymphs Abs: 1.8 K/uL (ref 0.7–4.0)
MCHC: 33.6 g/dL (ref 30.0–36.0)
MCV: 89.1 fl (ref 78.0–100.0)
Monocytes Absolute: 0.4 K/uL (ref 0.1–1.0)
Monocytes Relative: 8.7 % (ref 3.0–12.0)
Neutro Abs: 2.2 K/uL (ref 1.4–7.7)
Neutrophils Relative %: 45.1 % (ref 43.0–77.0)
Platelets: 174 K/uL (ref 150.0–400.0)
RBC: 4.93 Mil/uL (ref 4.22–5.81)
RDW: 12.9 % (ref 11.5–15.5)
WBC: 4.8 K/uL (ref 4.0–10.5)

## 2024-03-28 LAB — LIPID PANEL
Cholesterol: 159 mg/dL (ref 0–200)
HDL: 34 mg/dL — ABNORMAL LOW (ref >39.00)
LDL Cholesterol: 83 mg/dL (ref 0–99)
NonHDL: 124.91
Total CHOL/HDL Ratio: 5
Triglycerides: 212 mg/dL — ABNORMAL HIGH (ref 0.0–149.0)
VLDL: 42.4 mg/dL — ABNORMAL HIGH (ref 0.0–40.0)

## 2024-03-28 LAB — TSH: TSH: 3.56 u[IU]/mL (ref 0.35–5.50)

## 2024-03-28 LAB — HEPATITIS C ANTIBODY: Hepatitis C Ab: NONREACTIVE

## 2024-03-28 LAB — HEMOGLOBIN A1C: Hgb A1c MFr Bld: 5.8 % (ref 4.6–6.5)

## 2024-03-29 ENCOUNTER — Ambulatory Visit: Payer: Self-pay | Admitting: Family Medicine

## 2024-03-29 NOTE — Progress Notes (Signed)
 Labs ok.   A1C(3 month average of sugars) is elevated.  This may be considered PreDiabetes.  Work on diet-decrease sugars and starches and aim for 30 minutes of exercise 5 days/week to prevent progression to diabetes

## 2025-03-07 ENCOUNTER — Ambulatory Visit: Admitting: Internal Medicine
